# Patient Record
Sex: Female | Born: 1935 | Race: White | Hispanic: No | Smoking: Former smoker
Health system: Southern US, Community
[De-identification: ages and names within clinical notes are randomized; demographics above are authoritative.]

## PROBLEM LIST (undated history)

## (undated) DIAGNOSIS — I2510 Atherosclerotic heart disease of native coronary artery without angina pectoris: Secondary | ICD-10-CM

## (undated) DIAGNOSIS — M1990 Unspecified osteoarthritis, unspecified site: Secondary | ICD-10-CM

## (undated) DIAGNOSIS — R296 Repeated falls: Secondary | ICD-10-CM

## (undated) DIAGNOSIS — N183 Chronic kidney disease, stage 3 unspecified: Secondary | ICD-10-CM

## (undated) DIAGNOSIS — F03A0 Unspecified dementia, mild, without behavioral disturbance, psychotic disturbance, mood disturbance, and anxiety: Secondary | ICD-10-CM

## (undated) DIAGNOSIS — F0390 Unspecified dementia without behavioral disturbance: Secondary | ICD-10-CM

## (undated) DIAGNOSIS — F329 Major depressive disorder, single episode, unspecified: Secondary | ICD-10-CM

## (undated) DIAGNOSIS — J302 Other seasonal allergic rhinitis: Secondary | ICD-10-CM

## (undated) DIAGNOSIS — R42 Dizziness and giddiness: Secondary | ICD-10-CM

## (undated) DIAGNOSIS — I10 Essential (primary) hypertension: Secondary | ICD-10-CM

## (undated) DIAGNOSIS — D649 Anemia, unspecified: Secondary | ICD-10-CM

## (undated) DIAGNOSIS — J45909 Unspecified asthma, uncomplicated: Secondary | ICD-10-CM

## (undated) DIAGNOSIS — E119 Type 2 diabetes mellitus without complications: Secondary | ICD-10-CM

## (undated) DIAGNOSIS — R570 Cardiogenic shock: Secondary | ICD-10-CM

## (undated) DIAGNOSIS — D696 Thrombocytopenia, unspecified: Secondary | ICD-10-CM

## (undated) DIAGNOSIS — E785 Hyperlipidemia, unspecified: Secondary | ICD-10-CM

## (undated) DIAGNOSIS — K469 Unspecified abdominal hernia without obstruction or gangrene: Secondary | ICD-10-CM

## (undated) DIAGNOSIS — H409 Unspecified glaucoma: Secondary | ICD-10-CM

## (undated) DIAGNOSIS — F32A Depression, unspecified: Secondary | ICD-10-CM

## (undated) DIAGNOSIS — I639 Cerebral infarction, unspecified: Secondary | ICD-10-CM

## (undated) DIAGNOSIS — C44301 Unspecified malignant neoplasm of skin of nose: Secondary | ICD-10-CM

## (undated) DIAGNOSIS — T8092XA Unspecified transfusion reaction, initial encounter: Secondary | ICD-10-CM

## (undated) DIAGNOSIS — K219 Gastro-esophageal reflux disease without esophagitis: Secondary | ICD-10-CM

## (undated) HISTORY — PX: UMBILICAL HERNIA REPAIR: SHX196

## (undated) HISTORY — PX: VITRECTOMY: SHX106

## (undated) HISTORY — PX: EYE SURGERY: SHX253

## (undated) HISTORY — PX: APPENDECTOMY: SHX54

## (undated) HISTORY — PX: NEPHRECTOMY: SHX65

## (undated) HISTORY — PX: CORONARY ANGIOPLASTY WITH STENT PLACEMENT: SHX49

## (undated) HISTORY — PX: TUBAL LIGATION: SHX77

---

## 1959-02-17 DIAGNOSIS — I639 Cerebral infarction, unspecified: Secondary | ICD-10-CM

## 1959-02-17 HISTORY — DX: Cerebral infarction, unspecified: I63.9

## 1997-04-18 ENCOUNTER — Encounter (INDEPENDENT_AMBULATORY_CARE_PROVIDER_SITE_OTHER): Payer: Self-pay | Admitting: *Deleted

## 1997-04-18 LAB — CONVERTED CEMR LAB

## 1997-09-24 ENCOUNTER — Encounter: Admission: RE | Admit: 1997-09-24 | Discharge: 1997-09-24 | Payer: Self-pay | Admitting: Family Medicine

## 1997-10-18 ENCOUNTER — Ambulatory Visit (HOSPITAL_COMMUNITY): Admission: RE | Admit: 1997-10-18 | Discharge: 1997-10-19 | Payer: Self-pay | Admitting: Ophthalmology

## 1997-10-27 ENCOUNTER — Encounter: Admission: RE | Admit: 1997-10-27 | Discharge: 1997-10-27 | Payer: Self-pay | Admitting: Family Medicine

## 1997-11-24 ENCOUNTER — Encounter: Admission: RE | Admit: 1997-11-24 | Discharge: 1997-11-24 | Payer: Self-pay | Admitting: Family Medicine

## 1998-01-05 ENCOUNTER — Encounter: Admission: RE | Admit: 1998-01-05 | Discharge: 1998-01-05 | Payer: Self-pay | Admitting: Family Medicine

## 1998-02-15 ENCOUNTER — Encounter: Admission: RE | Admit: 1998-02-15 | Discharge: 1998-02-15 | Payer: Self-pay | Admitting: Sports Medicine

## 1998-03-04 ENCOUNTER — Encounter: Admission: RE | Admit: 1998-03-04 | Discharge: 1998-03-04 | Payer: Self-pay | Admitting: Family Medicine

## 1998-04-18 ENCOUNTER — Encounter: Admission: RE | Admit: 1998-04-18 | Discharge: 1998-04-18 | Payer: Self-pay | Admitting: Family Medicine

## 1998-05-11 ENCOUNTER — Encounter: Admission: RE | Admit: 1998-05-11 | Discharge: 1998-05-11 | Payer: Self-pay | Admitting: Family Medicine

## 1998-05-17 ENCOUNTER — Encounter: Admission: RE | Admit: 1998-05-17 | Discharge: 1998-05-17 | Payer: Self-pay | Admitting: Family Medicine

## 1998-07-21 ENCOUNTER — Encounter: Admission: RE | Admit: 1998-07-21 | Discharge: 1998-07-21 | Payer: Self-pay | Admitting: Family Medicine

## 1998-08-08 ENCOUNTER — Encounter: Admission: RE | Admit: 1998-08-08 | Discharge: 1998-08-08 | Payer: Self-pay | Admitting: Family Medicine

## 1998-09-02 ENCOUNTER — Encounter: Admission: RE | Admit: 1998-09-02 | Discharge: 1998-09-02 | Payer: Self-pay | Admitting: Family Medicine

## 1998-09-19 ENCOUNTER — Encounter: Admission: RE | Admit: 1998-09-19 | Discharge: 1998-09-19 | Payer: Self-pay | Admitting: Family Medicine

## 1998-10-04 ENCOUNTER — Encounter: Admission: RE | Admit: 1998-10-04 | Discharge: 1998-10-04 | Payer: Self-pay | Admitting: Family Medicine

## 1999-01-02 ENCOUNTER — Encounter: Admission: RE | Admit: 1999-01-02 | Discharge: 1999-01-02 | Payer: Self-pay | Admitting: Family Medicine

## 1999-01-09 ENCOUNTER — Encounter: Admission: RE | Admit: 1999-01-09 | Discharge: 1999-01-09 | Payer: Self-pay | Admitting: Family Medicine

## 1999-01-19 ENCOUNTER — Encounter: Admission: RE | Admit: 1999-01-19 | Discharge: 1999-01-19 | Payer: Self-pay | Admitting: Family Medicine

## 1999-02-01 ENCOUNTER — Encounter: Admission: RE | Admit: 1999-02-01 | Discharge: 1999-02-01 | Payer: Self-pay | Admitting: Family Medicine

## 1999-04-24 ENCOUNTER — Encounter: Admission: RE | Admit: 1999-04-24 | Discharge: 1999-04-24 | Payer: Self-pay | Admitting: Family Medicine

## 1999-07-12 ENCOUNTER — Encounter: Admission: RE | Admit: 1999-07-12 | Discharge: 1999-07-12 | Payer: Self-pay | Admitting: Family Medicine

## 1999-07-13 ENCOUNTER — Encounter: Admission: RE | Admit: 1999-07-13 | Discharge: 1999-10-11 | Payer: Self-pay | Admitting: Sports Medicine

## 1999-07-27 ENCOUNTER — Encounter: Admission: RE | Admit: 1999-07-27 | Discharge: 1999-07-27 | Payer: Self-pay | Admitting: Family Medicine

## 1999-09-15 ENCOUNTER — Encounter: Admission: RE | Admit: 1999-09-15 | Discharge: 1999-09-15 | Payer: Self-pay | Admitting: Family Medicine

## 1999-09-15 ENCOUNTER — Encounter: Payer: Self-pay | Admitting: Family Medicine

## 1999-10-12 ENCOUNTER — Encounter: Admission: RE | Admit: 1999-10-12 | Discharge: 1999-10-12 | Payer: Self-pay | Admitting: Family Medicine

## 1999-10-20 ENCOUNTER — Encounter: Payer: Self-pay | Admitting: Sports Medicine

## 1999-10-20 ENCOUNTER — Encounter: Admission: RE | Admit: 1999-10-20 | Discharge: 1999-10-20 | Payer: Self-pay | Admitting: Sports Medicine

## 1999-11-03 ENCOUNTER — Encounter: Admission: RE | Admit: 1999-11-03 | Discharge: 1999-11-03 | Payer: Self-pay | Admitting: Family Medicine

## 2000-01-03 ENCOUNTER — Encounter: Admission: RE | Admit: 2000-01-03 | Discharge: 2000-01-03 | Payer: Self-pay | Admitting: Family Medicine

## 2000-01-17 ENCOUNTER — Encounter: Admission: RE | Admit: 2000-01-17 | Discharge: 2000-01-17 | Payer: Self-pay | Admitting: Family Medicine

## 2000-01-20 ENCOUNTER — Encounter: Payer: Self-pay | Admitting: Emergency Medicine

## 2000-01-20 ENCOUNTER — Emergency Department (HOSPITAL_COMMUNITY): Admission: EM | Admit: 2000-01-20 | Discharge: 2000-01-20 | Payer: Self-pay | Admitting: Emergency Medicine

## 2000-03-29 ENCOUNTER — Encounter: Admission: RE | Admit: 2000-03-29 | Discharge: 2000-03-29 | Payer: Self-pay | Admitting: Family Medicine

## 2000-09-03 ENCOUNTER — Encounter: Admission: RE | Admit: 2000-09-03 | Discharge: 2000-09-03 | Payer: Self-pay | Admitting: Family Medicine

## 2000-09-03 ENCOUNTER — Ambulatory Visit (HOSPITAL_COMMUNITY): Admission: RE | Admit: 2000-09-03 | Discharge: 2000-09-03 | Payer: Self-pay | Admitting: Family Medicine

## 2000-09-12 ENCOUNTER — Encounter: Admission: RE | Admit: 2000-09-12 | Discharge: 2000-09-12 | Payer: Self-pay | Admitting: Family Medicine

## 2000-09-19 ENCOUNTER — Encounter: Admission: RE | Admit: 2000-09-19 | Discharge: 2000-09-19 | Payer: Self-pay | Admitting: *Deleted

## 2000-09-19 ENCOUNTER — Encounter: Payer: Self-pay | Admitting: Sports Medicine

## 2000-09-26 ENCOUNTER — Encounter: Payer: Self-pay | Admitting: Emergency Medicine

## 2000-09-26 ENCOUNTER — Emergency Department (HOSPITAL_COMMUNITY): Admission: EM | Admit: 2000-09-26 | Discharge: 2000-09-26 | Payer: Self-pay | Admitting: Emergency Medicine

## 2000-10-10 ENCOUNTER — Encounter: Admission: RE | Admit: 2000-10-10 | Discharge: 2000-10-10 | Payer: Self-pay | Admitting: Family Medicine

## 2000-12-03 ENCOUNTER — Encounter: Admission: RE | Admit: 2000-12-03 | Discharge: 2000-12-03 | Payer: Self-pay | Admitting: Family Medicine

## 2000-12-26 ENCOUNTER — Encounter: Admission: RE | Admit: 2000-12-26 | Discharge: 2000-12-26 | Payer: Self-pay | Admitting: Family Medicine

## 2001-01-30 ENCOUNTER — Encounter: Admission: RE | Admit: 2001-01-30 | Discharge: 2001-01-30 | Payer: Self-pay | Admitting: Family Medicine

## 2001-04-24 ENCOUNTER — Encounter: Admission: RE | Admit: 2001-04-24 | Discharge: 2001-04-24 | Payer: Self-pay | Admitting: Family Medicine

## 2001-05-05 ENCOUNTER — Encounter: Admission: RE | Admit: 2001-05-05 | Discharge: 2001-05-05 | Payer: Self-pay | Admitting: Sports Medicine

## 2001-05-14 ENCOUNTER — Encounter: Admission: RE | Admit: 2001-05-14 | Discharge: 2001-05-14 | Payer: Self-pay | Admitting: Family Medicine

## 2001-06-12 ENCOUNTER — Encounter: Admission: RE | Admit: 2001-06-12 | Discharge: 2001-06-12 | Payer: Self-pay | Admitting: Family Medicine

## 2001-06-20 ENCOUNTER — Encounter: Admission: RE | Admit: 2001-06-20 | Discharge: 2001-06-20 | Payer: Self-pay | Admitting: Family Medicine

## 2001-06-25 ENCOUNTER — Encounter: Admission: RE | Admit: 2001-06-25 | Discharge: 2001-06-25 | Payer: Self-pay | Admitting: Family Medicine

## 2001-08-11 ENCOUNTER — Encounter: Admission: RE | Admit: 2001-08-11 | Discharge: 2001-08-11 | Payer: Self-pay | Admitting: Family Medicine

## 2001-08-20 ENCOUNTER — Encounter: Admission: RE | Admit: 2001-08-20 | Discharge: 2001-08-20 | Payer: Self-pay | Admitting: Family Medicine

## 2001-09-22 ENCOUNTER — Encounter: Admission: RE | Admit: 2001-09-22 | Discharge: 2001-09-22 | Payer: Self-pay | Admitting: *Deleted

## 2001-09-22 ENCOUNTER — Encounter: Payer: Self-pay | Admitting: *Deleted

## 2001-09-22 ENCOUNTER — Encounter: Admission: RE | Admit: 2001-09-22 | Discharge: 2001-09-22 | Payer: Self-pay | Admitting: Family Medicine

## 2001-10-01 ENCOUNTER — Ambulatory Visit (HOSPITAL_COMMUNITY): Admission: RE | Admit: 2001-10-01 | Discharge: 2001-10-01 | Payer: Self-pay | Admitting: Gastroenterology

## 2001-10-30 ENCOUNTER — Encounter: Admission: RE | Admit: 2001-10-30 | Discharge: 2001-10-30 | Payer: Self-pay | Admitting: Family Medicine

## 2001-11-18 ENCOUNTER — Encounter: Admission: RE | Admit: 2001-11-18 | Discharge: 2001-11-18 | Payer: Self-pay | Admitting: Family Medicine

## 2001-11-21 ENCOUNTER — Encounter: Admission: RE | Admit: 2001-11-21 | Discharge: 2001-11-21 | Payer: Self-pay | Admitting: Family Medicine

## 2002-01-09 ENCOUNTER — Encounter: Admission: RE | Admit: 2002-01-09 | Discharge: 2002-01-09 | Payer: Self-pay | Admitting: Family Medicine

## 2002-01-19 ENCOUNTER — Ambulatory Visit (HOSPITAL_COMMUNITY): Admission: RE | Admit: 2002-01-19 | Discharge: 2002-01-20 | Payer: Self-pay | Admitting: Ophthalmology

## 2002-01-19 ENCOUNTER — Encounter: Payer: Self-pay | Admitting: Ophthalmology

## 2002-03-02 ENCOUNTER — Encounter: Admission: RE | Admit: 2002-03-02 | Discharge: 2002-03-02 | Payer: Self-pay | Admitting: Family Medicine

## 2002-03-13 ENCOUNTER — Encounter: Admission: RE | Admit: 2002-03-13 | Discharge: 2002-03-13 | Payer: Self-pay | Admitting: Sports Medicine

## 2002-06-01 ENCOUNTER — Encounter: Admission: RE | Admit: 2002-06-01 | Discharge: 2002-06-01 | Payer: Self-pay | Admitting: Family Medicine

## 2002-07-05 ENCOUNTER — Emergency Department (HOSPITAL_COMMUNITY): Admission: EM | Admit: 2002-07-05 | Discharge: 2002-07-05 | Payer: Self-pay | Admitting: Emergency Medicine

## 2002-07-05 ENCOUNTER — Encounter: Payer: Self-pay | Admitting: Emergency Medicine

## 2002-07-27 ENCOUNTER — Encounter: Admission: RE | Admit: 2002-07-27 | Discharge: 2002-07-27 | Payer: Self-pay | Admitting: Family Medicine

## 2002-10-15 ENCOUNTER — Encounter: Admission: RE | Admit: 2002-10-15 | Discharge: 2002-10-15 | Payer: Self-pay | Admitting: Family Medicine

## 2002-10-22 ENCOUNTER — Encounter: Admission: RE | Admit: 2002-10-22 | Discharge: 2002-10-22 | Payer: Self-pay | Admitting: Family Medicine

## 2003-02-01 ENCOUNTER — Encounter: Admission: RE | Admit: 2003-02-01 | Discharge: 2003-02-01 | Payer: Self-pay | Admitting: Sports Medicine

## 2003-02-17 ENCOUNTER — Encounter: Admission: RE | Admit: 2003-02-17 | Discharge: 2003-02-17 | Payer: Self-pay | Admitting: Family Medicine

## 2003-03-01 ENCOUNTER — Encounter: Payer: Self-pay | Admitting: Sports Medicine

## 2003-03-01 ENCOUNTER — Encounter: Admission: RE | Admit: 2003-03-01 | Discharge: 2003-03-01 | Payer: Self-pay | Admitting: Sports Medicine

## 2003-03-04 ENCOUNTER — Encounter: Admission: RE | Admit: 2003-03-04 | Discharge: 2003-03-04 | Payer: Self-pay | Admitting: Family Medicine

## 2003-03-25 ENCOUNTER — Encounter: Admission: RE | Admit: 2003-03-25 | Discharge: 2003-03-25 | Payer: Self-pay | Admitting: Family Medicine

## 2003-04-23 ENCOUNTER — Encounter: Admission: RE | Admit: 2003-04-23 | Discharge: 2003-04-23 | Payer: Self-pay | Admitting: Sports Medicine

## 2003-06-03 ENCOUNTER — Encounter: Admission: RE | Admit: 2003-06-03 | Discharge: 2003-06-03 | Payer: Self-pay | Admitting: Family Medicine

## 2003-06-19 HISTORY — PX: CARDIAC CATHETERIZATION: SHX172

## 2003-07-29 ENCOUNTER — Emergency Department (HOSPITAL_COMMUNITY): Admission: EM | Admit: 2003-07-29 | Discharge: 2003-07-29 | Payer: Self-pay | Admitting: Emergency Medicine

## 2003-08-17 ENCOUNTER — Ambulatory Visit (HOSPITAL_COMMUNITY): Admission: RE | Admit: 2003-08-17 | Discharge: 2003-08-17 | Payer: Self-pay | Admitting: Cardiology

## 2003-08-30 ENCOUNTER — Encounter: Admission: RE | Admit: 2003-08-30 | Discharge: 2003-08-30 | Payer: Self-pay | Admitting: Sports Medicine

## 2003-09-03 ENCOUNTER — Inpatient Hospital Stay (HOSPITAL_COMMUNITY): Admission: EM | Admit: 2003-09-03 | Discharge: 2003-09-05 | Payer: Self-pay | Admitting: Emergency Medicine

## 2003-10-04 ENCOUNTER — Encounter: Admission: RE | Admit: 2003-10-04 | Discharge: 2003-10-04 | Payer: Self-pay | Admitting: Family Medicine

## 2003-11-22 ENCOUNTER — Encounter: Admission: RE | Admit: 2003-11-22 | Discharge: 2003-11-22 | Payer: Self-pay | Admitting: Family Medicine

## 2004-01-21 ENCOUNTER — Encounter: Admission: RE | Admit: 2004-01-21 | Discharge: 2004-01-21 | Payer: Self-pay | Admitting: Family Medicine

## 2004-03-08 ENCOUNTER — Encounter: Admission: RE | Admit: 2004-03-08 | Discharge: 2004-03-08 | Payer: Self-pay | Admitting: Sports Medicine

## 2004-03-30 ENCOUNTER — Ambulatory Visit: Payer: Self-pay | Admitting: Family Medicine

## 2004-05-03 ENCOUNTER — Encounter: Admission: RE | Admit: 2004-05-03 | Discharge: 2004-05-03 | Payer: Self-pay | Admitting: Sports Medicine

## 2004-05-24 ENCOUNTER — Encounter: Admission: RE | Admit: 2004-05-24 | Discharge: 2004-05-24 | Payer: Self-pay | Admitting: Sports Medicine

## 2004-06-27 ENCOUNTER — Ambulatory Visit: Payer: Self-pay | Admitting: Family Medicine

## 2004-07-04 ENCOUNTER — Ambulatory Visit: Payer: Self-pay | Admitting: Cardiovascular Disease

## 2004-07-18 ENCOUNTER — Ambulatory Visit: Payer: Self-pay | Admitting: Family Medicine

## 2004-09-08 ENCOUNTER — Ambulatory Visit: Payer: Self-pay | Admitting: Sports Medicine

## 2004-10-26 ENCOUNTER — Emergency Department (HOSPITAL_COMMUNITY): Admission: EM | Admit: 2004-10-26 | Discharge: 2004-10-26 | Payer: Self-pay | Admitting: Emergency Medicine

## 2004-11-08 ENCOUNTER — Ambulatory Visit: Payer: Self-pay | Admitting: Cardiovascular Disease

## 2004-11-22 ENCOUNTER — Ambulatory Visit: Payer: Self-pay

## 2004-11-27 ENCOUNTER — Ambulatory Visit: Payer: Self-pay | Admitting: Sports Medicine

## 2004-12-11 ENCOUNTER — Ambulatory Visit: Payer: Self-pay | Admitting: Cardiovascular Disease

## 2005-01-25 ENCOUNTER — Ambulatory Visit: Payer: Self-pay | Admitting: Family Medicine

## 2005-02-08 ENCOUNTER — Ambulatory Visit: Payer: Self-pay | Admitting: Sports Medicine

## 2005-02-16 HISTORY — PX: CHOLECYSTECTOMY: SHX55

## 2005-02-26 ENCOUNTER — Encounter: Payer: Self-pay | Admitting: Emergency Medicine

## 2005-02-27 ENCOUNTER — Inpatient Hospital Stay (HOSPITAL_COMMUNITY): Admission: AD | Admit: 2005-02-27 | Discharge: 2005-03-05 | Payer: Self-pay | Admitting: General Surgery

## 2005-02-27 ENCOUNTER — Encounter: Payer: Self-pay | Admitting: Emergency Medicine

## 2005-02-27 ENCOUNTER — Ambulatory Visit: Payer: Self-pay | Admitting: Cardiology

## 2005-02-28 ENCOUNTER — Encounter (INDEPENDENT_AMBULATORY_CARE_PROVIDER_SITE_OTHER): Payer: Self-pay | Admitting: *Deleted

## 2005-04-06 ENCOUNTER — Ambulatory Visit: Payer: Self-pay | Admitting: Cardiovascular Disease

## 2005-04-06 ENCOUNTER — Ambulatory Visit: Payer: Self-pay | Admitting: Family Medicine

## 2005-07-04 ENCOUNTER — Ambulatory Visit: Payer: Self-pay | Admitting: Family Medicine

## 2005-09-05 ENCOUNTER — Emergency Department (HOSPITAL_COMMUNITY): Admission: EM | Admit: 2005-09-05 | Discharge: 2005-09-05 | Payer: Self-pay | Admitting: Emergency Medicine

## 2005-10-10 ENCOUNTER — Ambulatory Visit: Payer: Self-pay | Admitting: Family Medicine

## 2006-01-03 ENCOUNTER — Ambulatory Visit: Payer: Self-pay | Admitting: Cardiovascular Disease

## 2006-01-22 ENCOUNTER — Encounter: Payer: Self-pay | Admitting: Cardiology

## 2006-01-22 ENCOUNTER — Ambulatory Visit: Payer: Self-pay

## 2006-03-04 ENCOUNTER — Ambulatory Visit: Payer: Self-pay | Admitting: Family Medicine

## 2006-05-04 ENCOUNTER — Ambulatory Visit: Payer: Self-pay | Admitting: Family Medicine

## 2006-05-04 ENCOUNTER — Inpatient Hospital Stay (HOSPITAL_COMMUNITY): Admission: EM | Admit: 2006-05-04 | Discharge: 2006-05-07 | Payer: Self-pay | Admitting: Emergency Medicine

## 2006-05-22 ENCOUNTER — Ambulatory Visit: Payer: Self-pay

## 2006-08-15 DIAGNOSIS — F339 Major depressive disorder, recurrent, unspecified: Secondary | ICD-10-CM | POA: Insufficient documentation

## 2006-08-15 DIAGNOSIS — M479 Spondylosis, unspecified: Secondary | ICD-10-CM | POA: Insufficient documentation

## 2006-08-15 DIAGNOSIS — I739 Peripheral vascular disease, unspecified: Secondary | ICD-10-CM

## 2006-08-15 DIAGNOSIS — E669 Obesity, unspecified: Secondary | ICD-10-CM | POA: Insufficient documentation

## 2006-08-15 DIAGNOSIS — E118 Type 2 diabetes mellitus with unspecified complications: Secondary | ICD-10-CM

## 2006-08-15 DIAGNOSIS — E7800 Pure hypercholesterolemia, unspecified: Secondary | ICD-10-CM

## 2006-08-15 DIAGNOSIS — K219 Gastro-esophageal reflux disease without esophagitis: Secondary | ICD-10-CM | POA: Insufficient documentation

## 2006-08-15 DIAGNOSIS — I252 Old myocardial infarction: Secondary | ICD-10-CM

## 2006-08-15 DIAGNOSIS — H3530 Unspecified macular degeneration: Secondary | ICD-10-CM | POA: Insufficient documentation

## 2006-08-15 DIAGNOSIS — J309 Allergic rhinitis, unspecified: Secondary | ICD-10-CM | POA: Insufficient documentation

## 2006-08-15 DIAGNOSIS — E1165 Type 2 diabetes mellitus with hyperglycemia: Secondary | ICD-10-CM

## 2006-08-16 ENCOUNTER — Encounter (INDEPENDENT_AMBULATORY_CARE_PROVIDER_SITE_OTHER): Payer: Self-pay | Admitting: *Deleted

## 2006-08-22 ENCOUNTER — Ambulatory Visit: Payer: Self-pay | Admitting: Sports Medicine

## 2006-09-03 ENCOUNTER — Ambulatory Visit: Payer: Self-pay | Admitting: Cardiovascular Disease

## 2006-10-25 ENCOUNTER — Telehealth (INDEPENDENT_AMBULATORY_CARE_PROVIDER_SITE_OTHER): Payer: Self-pay | Admitting: *Deleted

## 2006-11-04 ENCOUNTER — Encounter (INDEPENDENT_AMBULATORY_CARE_PROVIDER_SITE_OTHER): Payer: Self-pay | Admitting: Family Medicine

## 2006-11-04 ENCOUNTER — Ambulatory Visit: Payer: Self-pay | Admitting: Sports Medicine

## 2006-11-04 LAB — CONVERTED CEMR LAB
BUN: 21 mg/dL (ref 6–23)
Chloride: 105 meq/L (ref 96–112)
HCT: 38.2 %
Hemoglobin: 13.1 g/dL
Potassium: 4.2 meq/L (ref 3.5–5.3)

## 2006-12-09 ENCOUNTER — Encounter (INDEPENDENT_AMBULATORY_CARE_PROVIDER_SITE_OTHER): Payer: Self-pay | Admitting: Family Medicine

## 2007-03-11 ENCOUNTER — Telehealth: Payer: Self-pay | Admitting: *Deleted

## 2007-03-13 ENCOUNTER — Encounter (INDEPENDENT_AMBULATORY_CARE_PROVIDER_SITE_OTHER): Payer: Self-pay | Admitting: Family Medicine

## 2007-03-13 ENCOUNTER — Ambulatory Visit: Payer: Self-pay | Admitting: Family Medicine

## 2007-03-13 LAB — CONVERTED CEMR LAB
Basophils Absolute: 0 10*3/uL (ref 0.0–0.1)
Basophils Relative: 0 % (ref 0–1)
Bilirubin Urine: NEGATIVE
Blood in Urine, dipstick: NEGATIVE
Chloride: 99 meq/L (ref 96–112)
Creatinine, Ser: 1.04 mg/dL (ref 0.40–1.20)
Glucose, Urine, Semiquant: 100
MCHC: 33.4 g/dL (ref 30.0–36.0)
Monocytes Absolute: 1 10*3/uL — ABNORMAL HIGH (ref 0.2–0.7)
Neutro Abs: 9.4 10*3/uL — ABNORMAL HIGH (ref 1.7–7.7)
Neutrophils Relative %: 81 % — ABNORMAL HIGH (ref 43–77)
Potassium: 4.5 meq/L (ref 3.5–5.3)
Protein, U semiquant: 30
RDW: 16 % — ABNORMAL HIGH (ref 11.5–14.0)
pH: 5.5

## 2007-03-17 ENCOUNTER — Telehealth: Payer: Self-pay | Admitting: *Deleted

## 2007-03-26 ENCOUNTER — Ambulatory Visit: Payer: Self-pay | Admitting: Cardiovascular Disease

## 2007-03-26 ENCOUNTER — Encounter: Admission: RE | Admit: 2007-03-26 | Discharge: 2007-03-26 | Payer: Self-pay | Admitting: Sports Medicine

## 2007-03-31 ENCOUNTER — Ambulatory Visit: Payer: Self-pay | Admitting: Family Medicine

## 2007-03-31 DIAGNOSIS — K5900 Constipation, unspecified: Secondary | ICD-10-CM | POA: Insufficient documentation

## 2007-03-31 LAB — CONVERTED CEMR LAB: Hgb A1c MFr Bld: 8.2 %

## 2007-05-06 ENCOUNTER — Ambulatory Visit: Payer: Self-pay | Admitting: Family Medicine

## 2007-05-08 ENCOUNTER — Telehealth: Payer: Self-pay | Admitting: *Deleted

## 2007-05-12 ENCOUNTER — Telehealth: Payer: Self-pay | Admitting: *Deleted

## 2007-05-19 ENCOUNTER — Encounter: Payer: Self-pay | Admitting: *Deleted

## 2007-06-13 ENCOUNTER — Encounter (INDEPENDENT_AMBULATORY_CARE_PROVIDER_SITE_OTHER): Payer: Self-pay | Admitting: Family Medicine

## 2007-08-06 ENCOUNTER — Encounter (INDEPENDENT_AMBULATORY_CARE_PROVIDER_SITE_OTHER): Payer: Self-pay | Admitting: Family Medicine

## 2007-08-13 ENCOUNTER — Ambulatory Visit: Payer: Self-pay

## 2007-08-13 ENCOUNTER — Encounter (INDEPENDENT_AMBULATORY_CARE_PROVIDER_SITE_OTHER): Payer: Self-pay | Admitting: Family Medicine

## 2007-09-03 ENCOUNTER — Ambulatory Visit: Payer: Self-pay | Admitting: Family Medicine

## 2007-09-03 LAB — CONVERTED CEMR LAB: Hgb A1c MFr Bld: 7.7 %

## 2007-09-12 ENCOUNTER — Telehealth: Payer: Self-pay | Admitting: *Deleted

## 2007-11-17 ENCOUNTER — Encounter (INDEPENDENT_AMBULATORY_CARE_PROVIDER_SITE_OTHER): Payer: Self-pay | Admitting: Family Medicine

## 2007-11-17 ENCOUNTER — Ambulatory Visit: Payer: Self-pay | Admitting: Family Medicine

## 2007-11-17 LAB — CONVERTED CEMR LAB: Hgb A1c MFr Bld: 7.3 %

## 2007-12-01 ENCOUNTER — Encounter (INDEPENDENT_AMBULATORY_CARE_PROVIDER_SITE_OTHER): Payer: Self-pay | Admitting: Family Medicine

## 2007-12-04 LAB — CONVERTED CEMR LAB
ALT: 12 units/L (ref 0–35)
AST: 15 units/L (ref 0–37)
Alkaline Phosphatase: 83 units/L (ref 39–117)
CO2: 23 meq/L (ref 19–32)
Creatinine, Ser: 1.14 mg/dL (ref 0.40–1.20)
Total Bilirubin: 0.4 mg/dL (ref 0.3–1.2)
Total CK: 37 units/L (ref 7–177)

## 2008-02-11 ENCOUNTER — Ambulatory Visit: Payer: Self-pay | Admitting: Cardiovascular Disease

## 2008-02-19 ENCOUNTER — Ambulatory Visit: Payer: Self-pay | Admitting: Family Medicine

## 2008-03-26 ENCOUNTER — Ambulatory Visit: Payer: Self-pay | Admitting: Family Medicine

## 2008-06-22 ENCOUNTER — Encounter (INDEPENDENT_AMBULATORY_CARE_PROVIDER_SITE_OTHER): Payer: Self-pay | Admitting: Family Medicine

## 2008-06-22 ENCOUNTER — Ambulatory Visit: Payer: Self-pay | Admitting: Family Medicine

## 2008-06-22 LAB — CONVERTED CEMR LAB
Chloride: 105 meq/L (ref 96–112)
Glucose, Bld: 174 mg/dL — ABNORMAL HIGH (ref 70–99)
Potassium: 4.6 meq/L (ref 3.5–5.3)
Sodium: 142 meq/L (ref 135–145)

## 2008-06-23 ENCOUNTER — Telehealth: Payer: Self-pay | Admitting: *Deleted

## 2008-06-28 ENCOUNTER — Telehealth: Payer: Self-pay | Admitting: *Deleted

## 2008-07-08 ENCOUNTER — Encounter: Payer: Self-pay | Admitting: Family Medicine

## 2008-07-08 ENCOUNTER — Telehealth: Payer: Self-pay | Admitting: Family Medicine

## 2008-08-06 ENCOUNTER — Encounter: Payer: Self-pay | Admitting: Cardiovascular Disease

## 2008-08-06 ENCOUNTER — Ambulatory Visit: Payer: Self-pay | Admitting: Cardiovascular Disease

## 2008-08-09 ENCOUNTER — Encounter: Payer: Self-pay | Admitting: Family Medicine

## 2008-10-03 ENCOUNTER — Emergency Department (HOSPITAL_COMMUNITY): Admission: EM | Admit: 2008-10-03 | Discharge: 2008-10-03 | Payer: Self-pay | Admitting: Emergency Medicine

## 2008-11-02 ENCOUNTER — Emergency Department (HOSPITAL_COMMUNITY): Admission: EM | Admit: 2008-11-02 | Discharge: 2008-11-02 | Payer: Self-pay | Admitting: Emergency Medicine

## 2008-11-07 ENCOUNTER — Emergency Department (HOSPITAL_COMMUNITY): Admission: EM | Admit: 2008-11-07 | Discharge: 2008-11-07 | Payer: Self-pay | Admitting: Emergency Medicine

## 2008-11-07 ENCOUNTER — Telehealth: Payer: Self-pay | Admitting: Family Medicine

## 2008-11-21 ENCOUNTER — Emergency Department (HOSPITAL_COMMUNITY): Admission: EM | Admit: 2008-11-21 | Discharge: 2008-11-21 | Payer: Self-pay | Admitting: Emergency Medicine

## 2008-11-22 ENCOUNTER — Ambulatory Visit: Payer: Self-pay | Admitting: Family Medicine

## 2008-11-22 ENCOUNTER — Encounter: Payer: Self-pay | Admitting: *Deleted

## 2008-11-22 DIAGNOSIS — R42 Dizziness and giddiness: Secondary | ICD-10-CM

## 2008-11-22 LAB — CONVERTED CEMR LAB: Hgb A1c MFr Bld: 6.8 %

## 2008-12-23 ENCOUNTER — Emergency Department (HOSPITAL_COMMUNITY): Admission: EM | Admit: 2008-12-23 | Discharge: 2008-12-23 | Payer: Self-pay | Admitting: Emergency Medicine

## 2008-12-23 ENCOUNTER — Telehealth: Payer: Self-pay | Admitting: Family Medicine

## 2008-12-24 ENCOUNTER — Telehealth: Payer: Self-pay | Admitting: *Deleted

## 2009-01-04 ENCOUNTER — Telehealth: Payer: Self-pay | Admitting: *Deleted

## 2009-02-02 ENCOUNTER — Encounter (INDEPENDENT_AMBULATORY_CARE_PROVIDER_SITE_OTHER): Payer: Self-pay | Admitting: *Deleted

## 2009-02-24 ENCOUNTER — Ambulatory Visit: Payer: Self-pay | Admitting: Family Medicine

## 2009-03-01 ENCOUNTER — Encounter: Payer: Self-pay | Admitting: Family Medicine

## 2009-03-14 ENCOUNTER — Encounter: Payer: Self-pay | Admitting: Family Medicine

## 2009-03-14 ENCOUNTER — Telehealth: Payer: Self-pay | Admitting: Family Medicine

## 2009-03-18 ENCOUNTER — Telehealth (INDEPENDENT_AMBULATORY_CARE_PROVIDER_SITE_OTHER): Payer: Self-pay | Admitting: *Deleted

## 2009-03-29 ENCOUNTER — Telehealth: Payer: Self-pay | Admitting: Family Medicine

## 2009-03-30 ENCOUNTER — Encounter: Payer: Self-pay | Admitting: Family Medicine

## 2009-04-12 ENCOUNTER — Ambulatory Visit: Payer: Self-pay | Admitting: Cardiovascular Disease

## 2009-04-12 ENCOUNTER — Ambulatory Visit: Payer: Self-pay | Admitting: Family Medicine

## 2009-04-12 DIAGNOSIS — I10 Essential (primary) hypertension: Secondary | ICD-10-CM | POA: Insufficient documentation

## 2009-06-07 ENCOUNTER — Telehealth: Payer: Self-pay | Admitting: *Deleted

## 2009-06-16 ENCOUNTER — Ambulatory Visit: Payer: Self-pay

## 2009-06-16 ENCOUNTER — Encounter: Payer: Self-pay | Admitting: Family Medicine

## 2009-06-16 LAB — CONVERTED CEMR LAB
AST: 11 units/L (ref 0–37)
Albumin: 3.9 g/dL (ref 3.5–5.2)
Alkaline Phosphatase: 73 units/L (ref 39–117)
Chloride: 105 meq/L (ref 96–112)
Glucose, Bld: 105 mg/dL — ABNORMAL HIGH (ref 70–99)
Potassium: 4.9 meq/L (ref 3.5–5.3)
Sodium: 142 meq/L (ref 135–145)
Total Protein: 6.5 g/dL (ref 6.0–8.3)

## 2009-06-19 ENCOUNTER — Encounter: Payer: Self-pay | Admitting: Family Medicine

## 2009-07-07 ENCOUNTER — Ambulatory Visit: Payer: Self-pay | Admitting: Family Medicine

## 2009-07-07 DIAGNOSIS — R32 Unspecified urinary incontinence: Secondary | ICD-10-CM

## 2009-07-07 DIAGNOSIS — R0609 Other forms of dyspnea: Secondary | ICD-10-CM

## 2009-07-07 DIAGNOSIS — R0989 Other specified symptoms and signs involving the circulatory and respiratory systems: Secondary | ICD-10-CM

## 2009-08-18 ENCOUNTER — Encounter (INDEPENDENT_AMBULATORY_CARE_PROVIDER_SITE_OTHER): Payer: Self-pay | Admitting: *Deleted

## 2009-10-17 ENCOUNTER — Telehealth: Payer: Self-pay | Admitting: Family Medicine

## 2009-10-26 ENCOUNTER — Encounter: Payer: Self-pay | Admitting: Family Medicine

## 2009-10-27 ENCOUNTER — Ambulatory Visit: Payer: Self-pay | Admitting: Family Medicine

## 2009-11-09 ENCOUNTER — Telehealth: Payer: Self-pay | Admitting: Family Medicine

## 2009-11-15 ENCOUNTER — Telehealth: Payer: Self-pay | Admitting: *Deleted

## 2009-11-17 ENCOUNTER — Encounter: Payer: Self-pay | Admitting: Family Medicine

## 2009-11-17 ENCOUNTER — Ambulatory Visit: Payer: Self-pay | Admitting: Cardiovascular Disease

## 2009-12-04 ENCOUNTER — Encounter: Payer: Self-pay | Admitting: Family Medicine

## 2009-12-13 ENCOUNTER — Encounter: Payer: Self-pay | Admitting: Family Medicine

## 2010-04-11 ENCOUNTER — Encounter: Admission: RE | Admit: 2010-04-11 | Discharge: 2010-04-11 | Payer: Self-pay | Admitting: Family Medicine

## 2010-04-14 ENCOUNTER — Encounter (INDEPENDENT_AMBULATORY_CARE_PROVIDER_SITE_OTHER): Payer: Self-pay | Admitting: Pharmacist

## 2010-07-18 NOTE — Miscellaneous (Signed)
Summary: med changes  Medications Added TOPROL XL 25 MG XR24H-TAB (METOPROLOL SUCCINATE) one tab by mouth qhs ISOSORBIDE MONONITRATE CR 60 MG  TB24 (ISOSORBIDE MONONITRATE) one tab by mouth daily       Clinical Lists Changes  Medications: Removed medication of COZAAR 25 MG TABS (LOSARTAN POTASSIUM) Take 1 tablet by mouth once a day Removed medication of FUROSEMIDE 20 MG TABS (FUROSEMIDE) take one tablet daily if needed for swelling Removed medication of GLUCOPHAGE 1000 MG TABS (METFORMIN HCL) Take 1 tablet by mouth twice a day Changed medication from METOPROLOL TARTRATE 25 MG  TABS (METOPROLOL TARTRATE) 1/2 tablet by mouth bid to TOPROL XL 25 MG XR24H-TAB (METOPROLOL SUCCINATE) one tab by mouth qhs - Signed Removed medication of ZYRTEC ALLERGY 10 MG TABS (CETIRIZINE HCL) Take 1 tablet by mouth once a day Changed medication from ISOSORBIDE MONONITRATE CR 60 MG  TB24 (ISOSORBIDE MONONITRATE) one tab by mouth bid to ISOSORBIDE MONONITRATE CR 60 MG  TB24 (ISOSORBIDE MONONITRATE) one tab by mouth daily - Signed Removed medication of MIRALAX  PACK (POLYETHYLENE GLYCOL 3350) take one packet twice daily this week, then take one packet once daily Removed medication of MECLIZINE HCL 25 MG TABS (MECLIZINE HCL) 1 by mouth as needed vertigo Rx of TOPROL XL 25 MG XR24H-TAB (METOPROLOL SUCCINATE) one tab by mouth qhs;  #30 x 3;  Signed;  Entered by: Lequita Asal  MD;  Authorized by: Lequita Asal  MD;  Method used: Electronically to [facility] [address] # [id number]*, [address] [place], [place], [place]  [address], Ph: [phone] or [phone], Fax: [phone] Rx of ISOSORBIDE MONONITRATE CR 60 MG  TB24 (ISOSORBIDE MONONITRATE) one tab by mouth daily;  #30 x 3;  Signed;  Entered by: Lequita Asal  MD;  Authorized by: Lequita Asal  MD;  Method used: Electronically to [facility] [address] # [id number]*, [address] [place], [place], [place]  [address], Ph: [phone] or [phone],  Fax: [phone]    Prescriptions: ISOSORBIDE MONONITRATE CR 60 MG  TB24 (ISOSORBIDE MONONITRATE) one tab by mouth daily  #30 x 3   Entered and Authorized by:   Lequita Asal  MD   Signed by:   Lequita Asal  MD on 10/26/2009   Method used:   Electronically to        [facility] [address] [id number]* (retail)       [address]       [place], [place]  [address]       Ph: [phone] or [phone]       Fax: [phone]   RxID:   [id number] TOPROL XL 25 MG XR24H-TAB (METOPROLOL SUCCINATE) one tab by mouth qhs  #30 x 3   Entered and Authorized by:   Lequita Asal  MD   Signed by:   Lequita Asal  MD on 10/26/2009   Method used:   Electronically to        [facility] [address] [id number]* (retail)       [address]       [place], [place]  [address]       Ph: [phone] or [phone]       Fax: [phone]   RxID:   [id number]

## 2010-07-18 NOTE — Progress Notes (Signed)
Summary: triage   Phone Note Call from Patient Call back at [phone]   Caller: Daughter-in-law Lourdes Sledge Summary of Call: Says mother-in-law is confused about her medications.   Initial call taken by: Clydell Hakim,  Nov 09, 2009 3:30 PM  Follow-up for Phone Call        states [facility] staff saw her mother in law saturday for services.  not sure what they are going to do. they did discuss snf with her but pt is "not ready" she is blind. lives alone. dtr lives in [place]. dtr in law lives in [place]. dtr in law states she does not know if she has the new blister packs of meds to take once a day.  in past she took them by feel. she is worried about the safety issues & stressors in her life. states she cannot go there now as her husband has stage 4 cancer & she is busy with him. dtr in [place] unable to come over frequently told her to call [facility] to see if med reminders were part of their services. call mom in law to see if new med packs have been recieved.  and old ones thrown out or returned. to pcp for further input Follow-up by: Golden Circle RN,  Nov 09, 2009 3:48 PM  Additional Follow-up for Phone Call Additional follow up Details #1::        need to figure out what pharmacy patient desires. susan did home visit last month to try and help. ultimately needs assisted living or snf Additional Follow-up by: Lequita Asal  MD,  Nov 09, 2009 4:23 PM

## 2010-07-18 NOTE — Letter (Signed)
Summary: [facility]/Episode Report  [facility]/Episode Report   Imported By: Knox Royalty 01/07/2010 12:39:07  _____________________________________________________________________  External Attachment:    Type:   Image     Comment:   External Document

## 2010-07-18 NOTE — Miscellaneous (Signed)
Summary: needs strips  Medications Added PRODIGY NO CODING BLOOD GLUC  STRP (GLUCOSE BLOOD) use once daily to check blood sugars. disp 100       Clinical Lists Changes her pharmacy in [place] called [phone], fax [phone]) asking for an order of testing strips. needs to know how often she checks cbgs per day. pt is very vision impaired from her macular degeneration. p[er notes she had not been checking them as she had no supplies. last provider of diabetic supplies was [place]. to pcp.Golden Circle RN  November 17, 2009 11:11 AM  I thought she had stopped because she couldnt see. once a day. she is only on oral meds.  Lequita Asal  MD  November 17, 2009 11:20 AM  Medications: Changed medication from PRODIGY VOICE BLOOD GLUCOSE  STRP (GLUCOSE BLOOD) use as directed. disp 100 to PRODIGY NO CODING BLOOD GLUC  STRP (GLUCOSE BLOOD) use once daily to check blood sugars. disp 100 - Signed Rx of PRODIGY VOICE BLOOD GLUCOSE  STRP (GLUCOSE BLOOD) use as directed. disp 100;  #1 x 3;  Signed;  Entered by: Lequita Asal  MD;  Authorized by: Lequita Asal  MD;  Method used: Electronically to [facility] [address], [place], [place], [place]  [address], Ph: [phone], Fax: [phone] Rx of PRODIGY NO CODING BLOOD GLUC  STRP (GLUCOSE BLOOD) use once daily to check blood sugars. disp 100;  #1 x 1;  Signed;  Entered by: Lequita Asal  MD;  Authorized by: Lequita Asal  MD;  Method used: Electronically to [facility] [address], [place], [place], [place]  [address], Ph: [phone], Fax: [phone]    Prescriptions: PRODIGY NO CODING BLOOD GLUC  STRP (GLUCOSE BLOOD) use once daily to check blood sugars. disp 100  #1 x 1   Entered and Authorized by:   Lequita Asal  MD   Signed by:   Lequita Asal  MD on 11/17/2009   Method used:   Electronically to        [facility] (retail)       [address]       [place], [place]  [address]       Ph: [phone]       Fax: [phone]   RxID:   [id number] PRODIGY VOICE BLOOD GLUCOSE  STRP (GLUCOSE BLOOD) use as directed. disp 100  #1 x 3   Entered and Authorized by:   Lequita Asal  MD   Signed by:   Lequita Asal  MD on 11/17/2009   Method used:   Electronically to        [facility] (retail)       [address]       [place], [place]  [address]       Ph: [phone]       Fax: [phone]   RxID:   [id number]  they specifically wanted to know how many times a day she is to test..[facility] RN  November 17, 2009 11:46 AM

## 2010-07-18 NOTE — Assessment & Plan Note (Signed)
Summary: home visit per Dr. Marijo Sanes   Primary Care Provider:  Lequita Asal  MD  CC:  Home Visit.  History of Present Illness: 77 y/o female with multiple medical issues seen for geriatric home visit  Personal care services- she currently receives 20 hours monthly. she states they mostly help her with cooking, making bed, and occasional help bathing. she has home health nursing which comes to help fill med box. previously received more time. patient elderly, blind, with claudication and other medical issues.   DM- on metformin and januvia. has speaking meter. has CBGs on wipe off board on kitchen table. patient denies perceived episodes of hypoglycemia.   urinary incontinence- reports urge-like symptoms. has ruined mattress due to nocturia. feels like she has a "hole in her pocketbook" in reference to her vagina.   SOB- significant DOE. extensive prior tobacco use (3 ppd x40 yrs).   Habits & Providers  Alcohol-Tobacco-Diet     Tobacco Status: quit > 6 months     Tobacco Counseling: to remain off tobacco products  Current Medications (verified): 1)  Aspirin Ec 81 Mg Tbec (Aspirin) .... Take 1 Tablet By Mouth Once A Day 2)  Cozaar 25 Mg Tabs (Losartan Potassium) .... Take 1 Tablet By Mouth Once A Day 3)  Famotidine 20 Mg Tabs (Famotidine) .... Take 1 Tablet By Mouth Twice A Day 4)  Furosemide 20 Mg Tabs (Furosemide) .... Take One Tablet Daily If Needed For Swelling 5)  Glucophage 1000 Mg Tabs (Metformin Hcl) .... Take 1 Tablet By Mouth Twice A Day 6)  Lotrisone 1-0.05 % Crea (Clotrimazole-Betamethasone) .... Apply A Small Amount To Skin Twice A Day. Disp 60 G 7)  Nitrostat 0.4 Mg Subl (Nitroglycerin) .... Place 1 Tablet Under Tongue As Directed 8)  Metoprolol Tartrate 25 Mg  Tabs (Metoprolol Tartrate) .... 1/2 Tablet By Mouth Bid 9)  Zoloft 100 Mg Tabs (Sertraline Hcl) .... Take 1 Tablet By Mouth Once A Day 10)  Zyrtec Allergy 10 Mg Tabs (Cetirizine Hcl) .... Take 1 Tablet By  Mouth Once A Day 11)  Simvastatin 40 Mg Tabs (Simvastatin) .... One Tab By Mouth Qhs 12)  Isosorbide Mononitrate Cr 60 Mg  Tb24 (Isosorbide Mononitrate) .... One Tab By Mouth Bid 13)  Januvia 50 Mg  Tabs (Sitagliptin Phosphate) .... One Tab By Mouth Qday 14)  Miralax  Pack (Polyethylene Glycol 3350) .... Take One Packet Twice Daily This Week, Then Take One Packet Once Daily 15)  Meclizine Hcl 25 Mg Tabs (Meclizine Hcl) .Marland Kitchen.. 1 By Mouth As Needed Vertigo 16)  Hydrocortisone 2.5 % Crea (Hydrocortisone) .... Apply To Affected Areas Twice A Day. Disp 60 G. 17)  Prodigy Voice Blood Glucose W/device Kit (Blood Glucose Monitoring Suppl) .... Use Ase Directed. 18)  Prodigy Voice Blood Glucose  Strp (Glucose Blood) .... Use As Directed. Disp 100  Allergies (verified): 1)  ! Advair Diskus (Fluticasone-Salmeterol)  Past History:  Past medical, surgical, family and social histories (including risk factors) reviewed, and no changes noted (except as noted below).  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 Father:cancer Mother:cancer  Social History: Reviewed history from 06/16/2009 and no changes required. Widowed. Lives alone in apartment with nurse aide 5 days per week.  [employer] 1hr daily. Has 7 children.  Her daughter, Theresia Majors, lives in [place]. Denies tobacco, alcohol, or drug use.Smoking Status:  quit > 6 months  Physical Exam  General:  elderly female, NAD. Lungs:  increased WOB with speaking. no accessory muscle use. bilateral occasional wheezes. prolonged expiratory phase.    Impression & Recommendations:  Problem # 1:  DYSPNEA ON EXERTION  (ICD-786.09) Assessment Deteriorated likely 2/2 COPD. would like patient to undergo PFTs to assess and determine best inhaler. patient reports intolerance to advair ("purple one")  Her updated medication list for this problem includes:    Furosemide 20 Mg Tabs (Furosemide) .Marland Kitchen... Take one tablet daily if needed for swelling    Metoprolol Tartrate 25 Mg Tabs (Metoprolol tartrate) .Marland Kitchen... 1/2 tablet by mouth bid  Orders: No Charge Patient Arrived (NCPA0) (NCPA0)  Problem # 2:  URINARY INCONTINENCE (ICD-788.30) Assessment: New patient to come for gyn eval in march to determine if anatomic issue causing symptoms (such as prolapse). no med changes for now  Problem # 3:  CONSTIPATION (ICD-564.00) continue miralax.   Her updated medication list for this problem includes:    Miralax Pack (Polyethylene glycol 3350) .Marland Kitchen... Take one packet twice daily this week, then take one packet once daily  Appended Document: home visit per Dr. Marijo Sanes spoke with Algernon Huxley at [facility] and faxed over updated list of medications

## 2010-07-18 NOTE — Letter (Signed)
Summary: Appointment - Reminder 2  [facility], Main Office  [address]   [place], [place] [address]   Phone: [phone]  Fax: [phone]     August 18, 2009 MRN: [id number]   DEAUNDRA DUPRIEST [address] [place], [place]  [address]   Dear [name],  Our records indicate that it is time to schedule a follow-up appointment with Dr. Eden Emms. It is very important that we reach you to schedule this appointment. We look forward to participating in your health care needs. Please contact us at the number listed above at your earliest convenience to schedule your appointment.  If you are unable to make an appointment at this time, give [place] a call so we can update our records.     Sincerely,  [facility] Scheduling Team

## 2010-07-18 NOTE — Miscellaneous (Signed)
Summary: Orders Update  Clinical Lists Changes  Problems: Added new problem of ENCOUNTER FOR LONG-TERM USE OF OTHER MEDICATIONS (ICD-V58.69) Orders: Added new Test order of B12-[facility] ([id number]) - Signed Added new Test order of CBC-[facility] (85027) - Signed   Ok per Dr. De La Cruz 

## 2010-07-18 NOTE — Miscellaneous (Signed)
Summary: Nurse, mental health Party Release Form   Imported By: Roderic Ovens 11/25/2009 16:17:31  _____________________________________________________________________  External Attachment:    Type:   Image     Comment:   External Document

## 2010-07-18 NOTE — Letter (Signed)
Summary: Generic Letter  [facility]  [address]   [place], [place] [address]   Phone: [phone]  Fax: [phone]    06/19/2009  [name] [address] [place], [place]  [address]  Dear [name],   Your cholesterol level was higher than I would recommend, given that you have diabetes and have had a heart attack before. I would like to switch you back to simvastatin for your cholesterol. You were previously on it, and there was concern it may have been making your muscles ache; however, your muscle labs were normal. I have sent a new prescription. If your legs start hurting, please let me know.         Sincerely,   Lequita Asal  MD  Appended Document: Generic Letter mailed.

## 2010-07-18 NOTE — Progress Notes (Signed)
Summary: meds prob   Phone Note Call from Patient Call back at Home Phone [phone]   Caller: Patient Summary of Call: is very confused about her meds and needs someone to come out and get them straight.  hasn't taken it in 2 days b/c she doesn't know which one to take.  has an aid, but she doesn't know either. Initial call taken by: De Nurse,  Oct 17, 2009 10:12 AM  Follow-up for Phone Call        states there is no one to come to her home & fill pill box. very hard of hearing. just lost a dtr recently, one has cancer 7 the other one cannot do it either. to pcp for suggestions as she did a home visit Follow-up by: Golden Circle RN,  Oct 17, 2009 11:11 AM  Additional Follow-up for Phone Call Additional follow up Details #1::        [facility] was coming to do this. not sure when it got discontinued.  Additional Follow-up by: Lequita Asal  MD,  Oct 17, 2009 11:23 AM    Additional Follow-up for Phone Call Additional follow up Details #2::    medicare will do some things for a brief time after discharge. since she had an aide, that must be from the [facility] program.  The aides do not fill pill boxes. she told me the sherrif brings the pill box. wants the doctor to come to her home & straighten things out as she is not taking any pills. states she has poor vision. suggested she come in so we can work with her on large print med bos. she said no, she wants the doctor to come. back to pcp Follow-up by: Golden Circle RN,  Oct 17, 2009 2:08 PM  Additional Follow-up for Phone Call Additional follow up Details #3:: Details for Additional Follow-up Action Taken: i am unable to do home visits. will send to [facility] Resident (Dr. Burnadette Pop) and Luretha Murphy to see if this is an option. Pt may need to consider Assisted Living or SNF if she is unable to manage at home.  Additional Follow-up by: Lequita Asal  MD,  Oct 17, 2009 2:15 PM

## 2010-07-18 NOTE — Assessment & Plan Note (Signed)
Summary: f39m/per daughter call/lg  Medications Added METOPROLOL TARTRATE 25 MG TABS (METOPROLOL TARTRATE) 1/2 tab by mouth two times a day ISOSORBIDE MONONITRATE CR 60 MG  TB24 (ISOSORBIDE MONONITRATE) 1 tab by mouth two times a day METFORMIN HCL 1000 MG TABS (METFORMIN HCL) 1 tab by mouth two times a day CETIRIZINE HCL 10 MG TABS (CETIRIZINE HCL) 1 tab by mouth once daily LOSARTAN POTASSIUM 25 MG TABS (LOSARTAN POTASSIUM) 1 tab by mouth once daily      Allergies Added:   Primary Provider:  Lequita Asal  MD   History of Present Illness: [name] is seen today for f/U of HTN, elevated lipids dyspnea and CAD.  She is a previous smoker with COPD.  Her last cath by Dr. Antoine Poche in 2005 showed a patent stent in the proximal RCA with a subtotally occluded OM branch and 50% PDA.  she has no active angina and a normal ECG on review today.  Given her age, dementia and lack of anginal she does not need a stress test.  Her last myovue in 07/2007 was low risk with a smalll inflat MI and mild periinfarct ischemia.  She gets around with a walker and has an aid at home in the morning.  Her children Harriett Sine) seem to keep an eye on her.  She is looking into the new [facility] in [place]  Current Problems (verified): 1)  Urinary Incontinence  (ICD-788.30) 2)  Dyspnea On Exertion  (ICD-786.09) 3)  Person Living Alone  (ICD-V60.3) 4)  Essential Hypertension, Benign  (ICD-401.1) 5)  Vertigo  (ICD-780.4) 6)  Constipation  (ICD-564.00) 7)  Rhinitis, Allergic  (ICD-477.9) 8)  Osteoarthritis of Spine, Nos  (ICD-721.90) 9)  Obesity, Nos  (ICD-278.00) 10)  Myocardial Infarction, Old  (ICD-412) 11)  Macular Degeneration  (ICD-362.50) 12)  Hypercholesterolemia  (ICD-272.0) 13)  Gastroesophageal Reflux, No Esophagitis  (ICD-530.81) 14)  Diabetes Mellitus, II, Complications  (ICD-250.92) 15)  Depression, Major, Recurrent  (ICD-296.30) 16)  Coronary, Arteriosclerosis  (ICD-414.00) 17)  Claudication,  Intermittent  (ICD-443.9)  Current Medications (verified): 1)  Aspirin Ec 81 Mg Tbec (Aspirin) .... Take 1 Tablet By Mouth Once A Day 2)  Famotidine 20 Mg Tabs (Famotidine) .... Take 1 Tablet By Mouth Twice A Day 3)  Metoprolol Tartrate 25 Mg Tabs (Metoprolol Tartrate) .... 1/2 Tab By Mouth Two Times A Day 4)  Zoloft 100 Mg Tabs (Sertraline Hcl) .... Take 1 Tablet By Mouth Once A Day 5)  Simvastatin 40 Mg Tabs (Simvastatin) .... One Tab By Mouth Qhs 6)  Isosorbide Mononitrate Cr 60 Mg  Tb24 (Isosorbide Mononitrate) .Marland Kitchen.. 1 Tab By Mouth Two Times A Day 7)  Januvia 50 Mg  Tabs (Sitagliptin Phosphate) .... One Tab By Mouth Qday 8)  Prodigy Voice Blood Glucose W/device Kit (Blood Glucose Monitoring Suppl) .... Use Ase Directed. 9)  Prodigy No Coding Blood Gluc  Strp (Glucose Blood) .... Use Once Daily To Check Blood Sugars. Disp 100 10)  Nitrostat 0.4 Mg Subl (Nitroglycerin) .... Place 1 Tablet Under Tongue As Directed 11)  Metformin Hcl 1000 Mg Tabs (Metformin Hcl) .Marland Kitchen.. 1 Tab By Mouth Two Times A Day 12)  Cetirizine Hcl 10 Mg Tabs (Cetirizine Hcl) .Marland Kitchen.. 1 Tab By Mouth Once Daily 13)  Losartan Potassium 25 Mg Tabs (Losartan Potassium) .Marland Kitchen.. 1 Tab By Mouth Once Daily  Allergies (verified): 1)  ! Advair Diskus (Fluticasone-Salmeterol)  Past History:  Past Medical History: Last updated: 07/07/2009 Diabetes Mellitus with retinopathy, nephropathy, neuropathy Gastroparesis Old  MI--lateral wall Umbilical Hernia Repair Anxiety/depression CAD Hyperlipidemia hx of reflux COPD  Past Surgical History: Last updated: 07/07/2009 Cardiac Cath/stent 2/96 Cholecystectomy - 02/16/2005 h/o L nephrectomy  Family History: Last updated: 08/05/2008 Father:cancer Mother:cancer  Social History: Last updated: 06/16/2009 Widowed. Lives alone in apartment with nurse aide 5 days per week.  [employer] 1hr daily. Has 7 children. Her daughter, Theresia Majors, lives in [place]. Denies tobacco, alcohol, or drug  use.  Review of Systems       Denies fever, malais, weight loss, blurry vision, decreased visual acuity, cough, sputum, SOB, hemoptysis, pleuritic pain, palpitaitons, heartburn, abdominal pain, melena, lower extremity edema, claudication, or rash.   Vital Signs:  Patient profile:   77 year old female Height:      61 inches Weight:      154 pounds BMI:     29.20 Pulse rate:   80 / minute Resp:     14 per minute BP sitting:   100 / 64  (left arm)  Vitals Entered By: Kem Parkinson (November 17, 2009 3:22 PM)  Physical Exam  General:  Affect appropriate Healthy:  appears stated age HEENT: normal Neck supple with no adenopathy JVP normal no bruits no thyromegaly Lungs clear with no wheezing and good diaphragmatic motion Heart:  S1/S2 no murmur,rub, gallop or click PMI normal Abdomen: benighn, BS positve, no tenderness, no AAA no bruit.  No HSM or HJR Distal pulses intact with no bruits No edema Neuro non-focal Skin warm and dry    Impression & Recommendations:  Problem # 1:  DYSPNEA ON EXERTION (ICD-786.09) Stable normal lung exam today;.  Her updated medication list for this problem includes:    Aspirin Ec 81 Mg Tbec (Aspirin) .Marland Kitchen... Take 1 tablet by mouth once a day    Metoprolol Tartrate 25 Mg Tabs (Metoprolol tartrate) .Marland Kitchen... 1/2 tab by mouth two times a day    Losartan Potassium 25 Mg Tabs (Losartan potassium) .Marland Kitchen... 1 tab by mouth once daily  Problem # 2:  ESSENTIAL HYPERTENSION, BENIGN (ICD-401.1) Well controlled Her updated medication list for this problem includes:    Aspirin Ec 81 Mg Tbec (Aspirin) .Marland Kitchen... Take 1 tablet by mouth once a day    Metoprolol Tartrate 25 Mg Tabs (Metoprolol tartrate) .Marland Kitchen... 1/2 tab by mouth two times a day    Losartan Potassium 25 Mg Tabs (Losartan potassium) .Marland Kitchen... 1 tab by mouth once daily  Problem # 3:  MYOCARDIAL INFARCTION, OLD (ICD-412) CAD stable with no angina.  Low risk myovue in 2009 Her updated medication list for this  problem includes:    Aspirin Ec 81 Mg Tbec (Aspirin) .Marland Kitchen... Take 1 tablet by mouth once a day    Metoprolol Tartrate 25 Mg Tabs (Metoprolol tartrate) .Marland Kitchen... 1/2 tab by mouth two times a day    Isosorbide Mononitrate Cr 60 Mg Tb24 (Isosorbide mononitrate) .Marland Kitchen... 1 tab by mouth two times a day    Nitrostat 0.4 Mg Subl (Nitroglycerin) .Marland Kitchen... Place 1 tablet under tongue as directed  Problem # 4:  HYPERCHOLESTEROLEMIA (ICD-272.0) Continue statin labs per primary Her updated medication list for this problem includes:    Simvastatin 40 Mg Tabs (Simvastatin) ..... One tab by mouth qhs  Patient Instructions: 1)  Your physician recommends that you schedule a follow-up appointment in: 6 MONTHS

## 2010-07-18 NOTE — Miscellaneous (Signed)
Summary: SUMMARY  patient is (hopefully) being transitioned to [facility], and will stop coming to [facility] if she is accepted. She is blind and very hard of hearing, so medications have been adjusted to be as simple as possible. patient has difficulty coming to appts, so is behind on labs. as part of [facility] this will be addressed.

## 2010-07-18 NOTE — Assessment & Plan Note (Signed)
Summary: home visit,df   Primary Care Provider:  Lequita Asal  MD   History of Present Illness: Home visit today per request of primary care physician.  The patient has called and was confused about her medicaions.  She has been receiving help through home health nursing to organize her medications as she has low vision and lives alone.  Home health discharged her this month and set up a mail delivery system that is very confusing, this system is through [facility] [phone], they combine pills in blister packs that are color coded for morning, afternoon, and evening.   She had multiple blister packs, it appeared that on she was not taking the medicaions several times per week.  She feels confused about the process.  She wants to stay in her home, does not want to live in a facility.  She complains of nocturia and accidents at night.    Current Medications (verified): 1)  Aspirin Ec 81 Mg Tbec (Aspirin) .... Take 1 Tablet By Mouth Once A Day 2)  Famotidine 20 Mg Tabs (Famotidine) .... Take 1 Tablet By Mouth Twice A Day 3)  Toprol Xl 25 Mg Xr24h-Tab (Metoprolol Succinate) .... One Tab By Mouth Qhs 4)  Zoloft 100 Mg Tabs (Sertraline Hcl) .... Take 1 Tablet By Mouth Once A Day 5)  Simvastatin 40 Mg Tabs (Simvastatin) .... One Tab By Mouth Qhs 6)  Isosorbide Mononitrate Cr 60 Mg  Tb24 (Isosorbide Mononitrate) .... One Tab By Mouth Daily 7)  Januvia 50 Mg  Tabs (Sitagliptin Phosphate) .... One Tab By Mouth Qday 8)  Prodigy Voice Blood Glucose W/device Kit (Blood Glucose Monitoring Suppl) .... Use Ase Directed. 9)  Prodigy Voice Blood Glucose  Strp (Glucose Blood) .... Use As Directed. Disp 100 10)  Nitrostat 0.4 Mg Subl (Nitroglycerin) .... Place 1 Tablet Under Tongue As Directed 11)  Lotrisone 1-0.05 % Crea (Clotrimazole-Betamethasone) .... Apply A Small Amount To Skin Twice A Day. Disp 60 G 12)  Hydrocortisone 2.5 % Crea (Hydrocortisone) .... Apply To Affected Areas Twice A Day. Disp 60  G.  Allergies (verified): 1)  ! Advair Diskus (Fluticasone-Salmeterol)  Review of Systems  The patient denies fever, weight loss, weight gain, chest pain, dyspnea on exertion, peripheral edema, prolonged cough, abdominal pain, and severe indigestion/heartburn.    Physical Exam  General:  Welll appearing, well groomed. Lungs:  normal respiratory effort and normal breath sounds.   Heart:  normal rate and regular rhythm.   Extremities:  no edema   Impression & Recommendations:  Problem # 1:  PERSON LIVING ALONE (ICD-V60.3) In consultation with primary MD, changed med list so that she is only has to take meds once daily.  The way that her current meds are packaged does not identify which pill is which so was unable to make up a pill box for her.  Recommend referral to the [facility] so that she can stay in her home and recieve he servies necessary.  In the meantime will cancel the mail order process, to prevent delivery for June.  Problem # 2:  URINARY INCONTINENCE (ICD-788.30) Lilely made worse by furosedmide, and recircluating fluid at night.  Using incontinence pads.    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  A1C has been in the 7% range for years.  She is unable to check sugars as she does not have supplies, had been receiving through [facility]. Her updated medication list  for this problem includes:    Aspirin Ec 81 Mg Tbec (Aspirin) .Marland Kitchen... Take 1 tablet by mouth once a day    Januvia 50 Mg Tabs (Sitagliptin phosphate) ..... One tab by mouth qday  Problem # 4:  ESSENTIAL HYPERTENSION, BENIGN (ICD-401.1) Discontinue ARB, on low dose. Her updated medication list for this problem includes:    Toprol Xl 25 Mg Xr24h-tab (Metoprolol succinate) ..... One tab by mouth qhs  Problem # 5:  CONSTIPATION (ICD-564.00) Uses PEG as needed and had a large supply.  Problem # 6:  MACULAR  DEGENERATION (ICD-362.50) Uses magnifier and still has difficulty.  Gave new list of meds in 24 font and she was able to read it with her magnifier.  Problem # 7:  HYPERCHOLESTEROLEMIA (ICD-272.0) Continue for now. Her updated medication list for this problem includes:    Simvastatin 40 Mg Tabs (Simvastatin) ..... One tab by mouth qhs  Problem # 8:  DEPRESSION, MAJOR, RECURRENT (ICD-296.30) She considers Zoloft as beneficial.  Complete Medication List: 1)  Aspirin Ec 81 Mg Tbec (Aspirin) .... Take 1 tablet by mouth once a day 2)  Famotidine 20 Mg Tabs (Famotidine) .... Take 1 tablet by mouth twice a day 3)  Toprol Xl 25 Mg Xr24h-tab (Metoprolol succinate) .... One tab by mouth qhs 4)  Zoloft 100 Mg Tabs (Sertraline hcl) .... Take 1 tablet by mouth once a day 5)  Simvastatin 40 Mg Tabs (Simvastatin) .... One tab by mouth qhs 6)  Isosorbide Mononitrate Cr 60 Mg Tb24 (Isosorbide mononitrate) .... One tab by mouth daily 7)  Januvia 50 Mg Tabs (Sitagliptin phosphate) .... One tab by mouth qday 8)  Prodigy Voice Blood Glucose W/device Kit (Blood glucose monitoring suppl) .... Use ase directed. 9)  Prodigy Voice Blood Glucose Strp (Glucose blood) .... Use as directed. disp 100 10)  Nitrostat 0.4 Mg Subl (Nitroglycerin) .... Place 1 tablet under tongue as directed 11)  Lotrisone 1-0.05 % Crea (Clotrimazole-betamethasone) .... Apply a small amount to skin twice a day. disp 60 g 12)  Hydrocortisone 2.5 % Crea (Hydrocortisone) .... Apply to affected areas twice a day. disp 60 g.  Appended Document: Orders Update     Clinical Lists Changes  Orders: Added new Service order of No Charge Patient Arrived (NCPA0) (NCPA0) - Signed

## 2010-07-18 NOTE — Progress Notes (Signed)
Summary: phn msg   Phone Note From Other Clinic Call back at [phone]   Caller: Forde Radon- Tim Summary of Call: needs a call back to assist with medicines Initial call taken by: De Nurse,  Nov 15, 2009 10:12 AM  Follow-up for Phone Call        SPOKE WITH HER NURSE FROM [facility]. pt has  prefilled meds from her pharmacy. But they are all in a pack for the day;it does not split it out into 8am, 10 am, bedtime, etc.  she does not take meds at proper times when aide is not there. he is going to try to work with pharamcy to get things packed differently. pt can see with a magnifying glass.   does not check her cbgs either, due to poor vision.  He wants her to have PT & OT.  needs [facility]. needs more hours than [facility]. will need [facility] for more hours.   will ask MD to write orders for PT & OT  I called [facility] [phone] to ask for this. Had to leave a message.  I called [facility] [phone] to make a referral.  had to leave a message Follow-up by: Golden Circle RN,  Nov 15, 2009 10:25 AM  Additional Follow-up for Phone Call Additional follow up Details #1::        at this point, all her meds are once a day, except famotidine, which is twice a day. that is as simple as we could make it. order for PT/OT in.  Additional Follow-up by: Lequita Asal  MD,  Nov 15, 2009 3:53 PM    [facility] called back. they are not taking any referrals at this time. possibly end of July.Marland KitchenMarland KitchenGolden Circle RN  Nov 15, 2009 11:06 AM

## 2010-09-26 LAB — TROPONIN I: Troponin I: 0.01 ng/mL (ref 0.00–0.06)

## 2010-09-26 LAB — URINALYSIS, ROUTINE W REFLEX MICROSCOPIC
Bilirubin Urine: NEGATIVE
Bilirubin Urine: NEGATIVE
Glucose, UA: NEGATIVE mg/dL
Nitrite: NEGATIVE
Nitrite: NEGATIVE
Specific Gravity, Urine: 1.015 (ref 1.005–1.030)
Specific Gravity, Urine: 1.019 (ref 1.005–1.030)
Urobilinogen, UA: 0.2 mg/dL (ref 0.0–1.0)
pH: 7.5 (ref 5.0–8.0)

## 2010-09-26 LAB — CBC
HCT: 37.2 % (ref 36.0–46.0)
MCHC: 32.7 g/dL (ref 30.0–36.0)
MCV: 96.3 fL (ref 78.0–100.0)
MCV: 96.7 fL (ref 78.0–100.0)
Platelets: 169 10*3/uL (ref 150–400)
Platelets: 179 10*3/uL (ref 150–400)
RDW: 15.4 % (ref 11.5–15.5)
WBC: 10.2 10*3/uL (ref 4.0–10.5)
WBC: 9.6 10*3/uL (ref 4.0–10.5)

## 2010-09-26 LAB — COMPREHENSIVE METABOLIC PANEL
AST: 19 U/L (ref 0–37)
Albumin: 3.4 g/dL — ABNORMAL LOW (ref 3.5–5.2)
BUN: 30 mg/dL — ABNORMAL HIGH (ref 6–23)
CO2: 28 mEq/L (ref 19–32)
Calcium: 9.3 mg/dL (ref 8.4–10.5)
Calcium: 9.4 mg/dL (ref 8.4–10.5)
Creatinine, Ser: 1.26 mg/dL — ABNORMAL HIGH (ref 0.4–1.2)
Creatinine, Ser: 1.31 mg/dL — ABNORMAL HIGH (ref 0.4–1.2)
GFR calc Af Amer: 47 mL/min — ABNORMAL LOW (ref >60)
GFR calc Af Amer: 49 mL/min — ABNORMAL LOW (ref >60)
GFR calc non Af Amer: 39 mL/min — ABNORMAL LOW (ref >60)
Glucose, Bld: 99 mg/dL (ref 70–99)
Total Bilirubin: 0.6 mg/dL (ref 0.3–1.2)
Total Protein: 6.3 g/dL (ref 6.0–8.3)

## 2010-09-26 LAB — DIFFERENTIAL
Basophils Absolute: 0 10*3/uL (ref 0.0–0.1)
Eosinophils Relative: 1 % (ref 0–5)
Lymphocytes Relative: 11 % — ABNORMAL LOW (ref 12–46)
Lymphocytes Relative: 14 % (ref 12–46)
Lymphs Abs: 1.1 10*3/uL (ref 0.7–4.0)
Lymphs Abs: 1.4 10*3/uL (ref 0.7–4.0)
Monocytes Absolute: 0.5 10*3/uL (ref 0.1–1.0)
Monocytes Relative: 6 % (ref 3–12)
Neutro Abs: 7.5 10*3/uL (ref 1.7–7.7)
Neutrophils Relative %: 81 % — ABNORMAL HIGH (ref 43–77)

## 2010-09-26 LAB — URINE MICROSCOPIC-ADD ON

## 2010-09-26 LAB — CK TOTAL AND CKMB (NOT AT ARMC)
Relative Index: INVALID (ref 0.0–2.5)
Total CK: 33 U/L (ref 7–177)

## 2010-09-26 LAB — LIPASE, BLOOD: Lipase: 22 U/L (ref 11–59)

## 2010-09-27 LAB — BASIC METABOLIC PANEL
Calcium: 9.1 mg/dL (ref 8.4–10.5)
GFR calc Af Amer: 52 mL/min — ABNORMAL LOW (ref >60)
GFR calc non Af Amer: 43 mL/min — ABNORMAL LOW (ref >60)
Glucose, Bld: 155 mg/dL — ABNORMAL HIGH (ref 70–99)
Sodium: 141 mEq/L (ref 135–145)

## 2010-09-27 LAB — GLUCOSE, CAPILLARY: Glucose-Capillary: 158 mg/dL — ABNORMAL HIGH (ref 70–99)

## 2010-10-31 NOTE — Assessment & Plan Note (Signed)
[facility]                            CARDIOLOGY OFFICE NOTE   [name], TORRICO                        MRN:          [id number]  DATE:02/11/2008                            DOB:          [birth year]    [name] returns today for followup.  She has known coronary  artery disease.  She has had a stent to the right coronary artery and  diffuse OM disease.  Her last cath was in 2008.  Her medical therapy was  warranted.  She had a low-risk Myoview in February 2009, with lateral  wall infarct and mild peri-infarct ischemia.  She continues to take  nitro on occasion; however, her pain is nonexertional.  It tends to  occur at night.  She takes 1 or 2 nitro and then goes back to sleep.  She may have had 3-4 episodes a month since I saw her in February.  I  think her clinical syndrome is stable.  [name] continues to be excitable.  She has issues regarding the pharmacy and her medications.  We spent  quite a bit of time talking about this.  Apparently, the pharmacy will  substitute the generic drug and she gets confused about when and how to  take it.  I talked to her daughter at length and suggested that she get  a weekly pillbox.   We will also write out an exact list of her medications what they are  for and when she is supposed to take them.   The patient outside of some atypical pain is not having exertional  dyspnea, palpitations, PND, or orthopnea.  There has been no syncope.   Her review of systems is remarkable for continued issues regarding  multiple medications.  She does not like taking her Januvia.  She says  her sugars have been running in a fairly good range.  She says sometimes  70-80s and sometimes 140s.  I do not have a hemoglobin A1c from Dr.  Iven Finn.  She is also concerned about her bowels.  She would appear to  have some gastroparesis as she is on Reglan.  I tried to explain that  the Reglan and stool softeners before this, but  she continues to  complain of constipation.   Outside of the issues regarding confusion with her medications, [name]  seems to be doing fairly well.   ALLERGIES:  She is allergic to SULFA.   She is supposed to be on,  1. Metoprolol 25 mg half a tablet b.i.d.  2. Aspirin a day.  3. Imdur 60 b.i.d.  4. Zocor 20 a day.  5. Zoloft 100 nightly.  6. Cozaar 25 a day.  7. Combivent.  8. Metformin 1 g b.i.d.  9. Stool softener.  10.Pravastatin 40 a day.  11.Januvia 50 mg a day.  12.Reglan t.i.d.   PHYSICAL EXAMINATION:  GENERAL:  Remarkable for a talkative white female  in no distress.  VITAL SIGNS:  Weight is 166, blood pressure is 116/74, pulse 76 and  regular, respiratory rate 14, afebrile.  HEENT:  Unremarkable.  NECK:  Carotids normal without bruit and no lymphadenopathy or  thyromegaly.  JVP elevation.  LUNGS:  Clear diaphragmatic motion.  No wheezing.  HEART:  S1 and S2 with distant heart sounds.  PMI not palpable.  ABDOMEN:  Protuberant.  Bowel sounds positive.  No AAA, no tenderness,  no bruit, and no hepatosplenomegaly or hepatojugular reflux.  No  tenderness.  EXTREMITIES:  Distal pulses are intact.  No edema.  NEURO:  Nonfocal.  SKIN:  Warm and dry.  MUSCULOSKELETAL:  No muscular weakness.   EKG shows sinus rhythm with T-wave inversions in I and L, and  nonspecific ST-T wave changes.   IMPRESSION:  1. Coronary artery disease.  Previous stent to distal right coronary      artery with diffuse OM disease.  Continue aspirin, beta-blocker,      and nitrates.  2. Hypercholesterolemia in the setting of coronary artery disease.      Continue pravastatin and lipid and liver profile in 6 months.  3. Diabetes.  Continue metformin and Januvia.  Hemoglobin A1c      quarterly.  Follow up primary care MD.  4. Anxiety and depression.  Continue Zoloft.  I explained to the      patient that she should take this at night to help with her      insomnia.  5. History of reflux.   Continue H2 blocker.  6. Previous smoker with chronic obstructive pulmonary disease.      Continue Combivent 2 puffs daily.   We spent considerable time with [name] going over her medications and  making sure she had a printed list with when she is supposed to take the  drugs and also what they are for, and in particular to make sure she  took her statin drug at night.     [name]. Eden Emms, MD, [facility]  Electronically Signed    PCN/MedQ  DD: 02/11/2008  DT: 02/12/2008  Job #: [id number]

## 2010-10-31 NOTE — Assessment & Plan Note (Signed)
[facility]                            CARDIOLOGY OFFICE NOTE   SHAWNTEE, MAINWARING                        MRN:          [id number]  DATE:03/26/2007                            DOB:          1922-01-25    SUBJECTIVE:  Arianny returns today for followup.  She has a history of  distant lateral wall MI with percutaneous transluminal coronary  angioplasty and stenting of the obtuse marginal.  She was cathed in 2006  and medical therapy was recommended.  Her last Myoview in August of 2007  showed lateral wall infarct with no significant ischemia and an ejection  fraction of 62%.   Maribella has been doing fairly well.  She has occasional chest pains.  She  maybe takes one to two nitroglycerin a month.  On occasion she is taking  two.  The pains don't necessarily sound anginal but Ziomara has been doing  this for quite some time.  She in general gets around with a walker  without significant chest pain or dyspnea.  She has had no palpitations.  She has some chronic dizziness from vertigo but no frank syncope or  palpitations.   She has been compliant with her medications.  She probably needs to have  a followup Myoview in March, given her diabetes and known coronary  disease with occasional chest pain.   REVIEW OF SYSTEMS:  The patient's review of systems is remarkable for  what sounds like some irritable bowel syndrome.  She may have  gastroparesis.  Food seems to get stuck a little bit in her esophagus  and she has profound constipation from eating cheese.   The patient has otherwise been doing well.   MEDICATIONS:  1. Lasix 20 mg daily.  2. Aspirin daily.  3. Imdur 60 b.i.d.  4. Zocor 20 a day.  5. Zoloft 100 daily.  6. Glucophage 1 gram b.i.d.  7. Cozaar 25 daily.  8. Combivent.  9. Metformin 1 gram.  10.Glucophage 1 gram b.i.d.   PHYSICAL EXAMINATION:  GENERAL APPEARANCE:  Her exam is remarkable for  an elderly white female in no distress.  Affect  is jovial.  VITAL SIGNS:  Weight is 164.  Blood pressure is 105/60. Pulse is 84 and  regular.  Respiratory rate is 14.  She is afebrile.  HEENT:  Normal.  NECK:  Supple.  There is no lymphadenopathy, no thyromegaly, no JVP  elevation, no bruits.  LUNGS:  Clear, good diaphragmatic motion, no wheezing.  HEART:  There is an S1, S2 with a soft systolic murmur.  PMI is normal.  ABDOMEN:  Benign.  Bowel sounds positive.  No distention.  No  tenderness.  No hepatosplenomegaly.  No hepatojugular reflux.  She is  status post cholecystectomy.  EXTREMITIES:  Femoral's are +3 bilaterally without bruits.  Posterior  tibial's are +2.  There is no lower extremity edema.  NEUROLOGICAL:  Nonfocal.  She dos have a bit of dementia.  There is no  muscular weakness.  SKIN:  Warm and dry.   IMPRESSION:  1. History of lateral wall myocardial infarction, occasional  chest      pain that does not sound changed at all.  Nonischemic Myoview about      one year ago.  Followup Myoview in March.  Continue current therapy      with long-acting nitrates.  The patient currently is not on a beta      blocker, however, her resting heart rate is 60 and I think she      would do best without it.  2. Lower extremity edema, currently stable, resolved with low salt      diet and current dose of Lasix.  She appears euvolemic.  3. Severe constipation.  The patient was told to avoid cheese.  She      will go to the drugstore with her daughter to get some Milk of      Magnesia and Dulcolax suppositories.  She already has a stool      softener.  She was  encouraged to followup with her primary care      M.D.  She has had reflux in the past and this is usually taken care      of by Pepcid.  The patient may benefit from Reglan in the future      due to possible diabetic gastroparesis.  I will leave this up to      her primary care doctor to look into.  4. Hypertension.  Currently well controlled.  Continue Cozaar 25 a      day.   No end-organ damage with normal creatinine.  5. Diabetes.  Followup primary care M.D.  Hemoglobin A1C quarterly.      Continue metformin.  6. Hypercholesterolemia in the setting of coronary disease.  Continue      Zocor 20 a day and followup lipid and liver profile in six months.     Noralyn Pick. Eden Emms, MD, [facility]  Electronically Signed    PCN/MedQ  DD: 03/26/2007  DT: 03/27/2007  Job #: [id number]

## 2010-10-31 NOTE — Assessment & Plan Note (Signed)
[facility]                            CARDIOLOGY OFFICE NOTE   CECILA, SATCHER                        MRN:          [id number]  DATE:08/13/2007                            DOB:          [birth year]    [name] returns today for follow-up.  She has had a distant history of  lateral MI.  She had a Myoview study today.  I personally reviewed the  study, time 10 minutes.  She has a dense lateral wall infarction with a  normal EF.  There is no ischemia.  Sreenidhi is her usual histrionic self.  She is doing well.  She has not had any significant chest pain, PND,  orthopnea.  She ambulates well with a walker.  The patient seemed to be  a little excitable about her diabetes medications.  She wanted to go  over these.  We called over to [facility] to find out what was going on.  She was normally on Glucophage 1 g b.i.d., and she has been started on  Januvia.  I went over this with her.   From a cardiac perspective, she is otherwise stable.  She is not having  palpitations, PND, orthopnea, no chest pain, no syncope. Review of  systems otherwise negative.  Her constipation has improved.   Her medications include:  1. Lasix 20 a day.  2. Aspirin a day.  3. Imdur 60 b.i.d.  4. Zocor 20 a day.  5. Zoloft 100 a day.  6. Glucophage 1 g b.i.d.  7. Januvia dose unknown.  8. Cozaar 25 a day.  9. Combivent.  10.Metformin 1 g b.i.d.   Her exam is remarkable for an excitable elderly white female in no  distress.  Affect is jovial.  Weight is 158, blood pressure is 140/80,  pulse is 88 and regular, respiratory 14.  HEENT:  Unremarkable.  Carotids normal without bruit.  No  lymphadenopathy, thyromegaly, JVP elevation.  LUNGS:  Clear.  Good diaphragmatic motion.  No wheezing.  S1-S2 with  normal heart sounds.  PMI normal.  ABDOMEN:  Benign.  Bowel sounds positive.  No AAA.  No tenderness.  No  hepatosplenomegaly, no hepatojugular reflux.  Distal pulses are intact, no  edema.  SKIN:  Warm and dry.  NEUROLOGICAL:  Nonfocal.  No muscular weakness.   IMPRESSION:  1. Coronary artery disease.  Previous lateral wall myocardial      infarction, nonischemic Myoview.  Continue aspirin and nitrate.  2. Hyperlipidemia in the setting of diabetes and coronary disease.      Continue Zocor 20 a day, lipid liver profile in 6 months.  3. Diabetes.  Januvia added.  Check hemoglobin A1c in 3 months.      Target 6.  Continue low cholesterol diet.  We will send her over to      [facility] for nutrition consult.  4. Previous constipation.  Improved on Colace 100 b.i.d.  Continue to      avoid pain medicine.  5. Lower extremity edema, stable.  Continue Lasix 20 a day, low-salt      diet.  I will see Chyler back in about 6 months.     Noralyn Pick. Eden Emms, MD, [facility]  Electronically Signed    PCN/MedQ  DD: 08/13/2007  DT: 08/14/2007  Job #: [id number]

## 2010-11-03 NOTE — H&P (Signed)
[name], [name]                 ACCOUNT NO.:  [id number]   MEDICAL RECORD NO.:  [id number]          PATIENT TYPE:  INP   LOCATION:  3040                         FACILITY:  [facility]   PHYSICIAN:  Adrian Blackwater, MDDATE OF BIRTH:  1921-10-01   DATE OF ADMISSION:  05/04/2006  DATE OF DISCHARGE:                                HISTORY & PHYSICAL   CHIEF COMPLAINT:  Dizziness.   HISTORY OF PRESENT ILLNESS:  [name] is an 77 year old white female  patient who presented to the ED complaining of being dizzy since last  Thursday.  Every time she changed position, looked to the side or sat up and  tried to look around, the sensation of movement is worsening and associated  with nausea this morning.  She denies any cold symptoms.  She has been  afebrile.  Normal voiding and defecation.  She may refer occasional tinnitus  on the right ear without any ear pain.  She has a history of chronic  tympanic membrane perforation.  She has improved after meclizine treatment  in the ER, but still unable to ambulate without any symptoms.   REVIEW OF SYSTEMS:  CONSTITUTIONAL:  No recent weight changes.  CARDIOVASCULAR:  Denies chest pain, no shortness of breath on exertion.  PULMONARY:  Denies shortness of breath.  No cough.  Afebrile.  GI:  Denies  nausea, vomiting, or diarrhea.  GENITOURINARY:  Denies dysuria.  SKIN:  No  lesions.   PROBLEM LIST:  1. Diabetes mellitus type 2 with complication.  2. Hyperlipidemia.  3. Major depression.  4. Macular degeneration.  5. Coronary artery disease.  6. Gastroesophageal reflux disease.  7. Intermittent claudication.  8. Obesity.  9. History of myocardial infarction.  10.Arthritis.   MEDICATION LIST:  1. Actos 20 mg p.o. daily.  2. Aspirin 81 mg p.o. daily.  3. Cozaar 25 mg p.o. daily.  4. Glucophage 1000 mg b.i.d.  5. Imdur 6 mg p.o. b.i.d.  6. Lasix 20 mg p.o. daily.  7. Nitrostat 0.6 mg as needed.  8. Toprol XL 25 mg daily.  9. Zocor 20 mg  p.o. daily.  10.Zoloft 100 mg daily at bedtime.  11.Zyrtec 10 mg p.o. daily as needed.  12.Lyrica 25 mg p.o. b.i.d.   ALLERGIES:  SULFA DRUGS.   PROCEDURES:  1. Left nephrectomy secondary to staghorn.  2. Umbilical hernia repair.  3. 2-D echo on January 22, 2006, showed left ventricular ejection fraction      of 50-55% and a small pericardial effusion.  4. Cardiac cath in 2005 by Dr. Antoine Poche, recommended medical therapy.  5. Renal ultrasound in May 2001 within normal limits.  6. ABI 0.79 in June 2003.  7. Cardiolite in August 2007, ejection fraction 62%, no ischemia, old      lateral infarct.  8. P-score on DEXA -0.58, low risk, in January 2006.   SOCIAL HISTORY:  She is a widow, lives alone.  She receives health care from  [facility] 5 hours daily since 2004.  She has 7 children, some of them  living in [place].  She denies any tobacco,  alcohol, or drug use.   PHYSICAL EXAMINATION:  VITAL SIGNS:  T-max 97, heart rate 81, blood pressure  131/69, respiratory rate 24, O2 of 95% on room air.  GENERAL APPEARANCE:  No acute distress.  MENTAL STATUS:  Alert and oriented x3.  Fluent speech.  Normal  confrontation.  Following commands.  HEENT:  Normocephalic.  Pupils are equal and reactive to light and  accommodation.  Intact extraocular movements.  No nystagmus present.  There  is mild perforation of the left tympanic membrane with scarring.  Oropharynx  normal.  CHEST/BREASTS:  Nontender.  No bruises.  RESPIRATORY:  Clear to auscultation bilaterally.  CVS:  S1, S2 normal.  RRR.  No murmur, rubs, or gallops.  ABDOMEN:  Soft.  Positive bowel sounds.  Nontender, nondistended.  No  hepatosplenomegaly.  EXTREMITIES:  No edema.  Intact tactile proprioception sensation.  No  clonus.  Pulses palpable symmetrically.  Capillary refill less than 2  seconds.  GENITAL/RECTAL:  Deferred.  NEUROLOGIC:  The patient is alert and oriented x3 with fluent speech.  Very  interactive.  Following  commands appropriately.  Cranial nerves II-XII  intact.  Strength 5/5 bilaterally.  DTRs 2+.  No nystagmus resting nor  associated with head positional changes, but dizziness reproduced by  maneuver.  BALANCE/GAIT:  __________, finger-to-nose and heel-to-shin negative.  SKIN:  No lesions.   LABORATORY TESTS:  Urinalysis negative.  WBC 7, hemoglobin 13.4, platelets  191.  Creatinine 1.1, sodium 139, potassium 4.8, glucose 118.   Head CT shows atrophy and chronic small vessel ischemic changes.  No acute  findings.   ECG:  Normal sinus rhythm, low voltage, nonspecific T-wave abnormality,  unchanged from February 27, 2005.   ASSESSMENT:  [name] is an 77 year old white female patient of Dr.  Altamese Cabal who presented to the emergency room with a 2-day history of  dizziness and occasional nausea.   PLAN:  1. Dizziness.  Differential diagnosis vertigo, presyncopal,      dysequilibrium, psychiatric disorders, CVA, METS.  The most probable      diagnosis in her case is vertigo.  Per patient's report of symptoms,      sensation of movement around triggered by sudden head position changes.      No nystagmus present on examinations, but these are triggered by lying      down and bending the neck back to the cup.  There are no hemodynamic      changes during the maneuver, which rules against presyncopal nature,      also visual impairment and chronic sclerosal tympanic membrane may have      played a role on presentation.  Central nervous system cause rule out.      CT scan obtained did not show any acute findings.  METS were reviewed      and electrolytes were normal.  2. Diabetes mellitus type 2.  Will check hemoglobin A1c, continue Actos      and Glucophage, plus sliding-scale insulin.  3. Hyperlipidemia.  Continue Zocor.  4. Coronary artery disease and PAD.  Continue medical management.  5. GERD.  PPI if symptomatic.  6. Depression.  Stable.     Adrian Blackwater,  MD     IM/MEDQ  D:  05/07/2006  T:  05/08/2006  Job:  [id number]

## 2010-11-03 NOTE — Discharge Summary (Signed)
NAMEBRYER, GOTTSCH                           ACCOUNT NO.:  [id number]   MEDICAL RECORD NO.:  [id number]                   PATIENT TYPE:  INP   LOCATION:  3707                                 FACILITY:  [facility]   PHYSICIAN:  Rebecca L. Jolinda Croak, M.D.          DATE OF BIRTH:  30-Jul-1921   DATE OF ADMISSION:  09/03/2003  DATE OF DISCHARGE:  09/05/2003                                 DISCHARGE SUMMARY   DISCHARGE DIAGNOSES:  1. Vomiting, resolved.  2. Dehydration, resolved.  3. Coronary artery disease.  4. Diabetes mellitus.  5. Musculoskeletal pain.   DISCHARGE MEDICATIONS:  The patient is to continue all home medications,  which include:  1. Actos 30 mg daily.  2. Aspirin 85 mg daily.  3. Atrovent two puffs q.i.d. with spacer.  4. Cozaar 25 mg daily.  5. Glucophage 1000 mg b.i.d.  6. Imdur 60 mg b.i.d.  7. Nitrostat p.r.n.  8. Toprol XL 25 mg one tablet p.o. daily.  9. Zocor 20 mg daily.  10.      Zoloft 100 mg q.h.s.  11.      Tylenol 500 mg one p.o. daily p.r.n.   PAIN MANAGEMENT:  She is to use Tylenol 650 mg every six hours as needed.   DIET:  Should be carbohydrate modified.   FOLLOWUP:  Can call the office on Monday for a followup appointment with Dr.  Lazarus Salines in one to two weeks.   HOSPITAL COURSE:  Ms. Charbonneau is a 77 year old female with a history of  diabetes, hypercholesterolemia, and prior MI, who presented with sharp chest  pain while lying in bed.  The patient reports taking nitroglycerin and said  that the pain went away.  She then ate breakfast.  After breakfast, the  patient felt nauseated and then vomited several times.  The patient came in  that evening.  She denies any diarrhea or abdominal pain.  The patient had a  chest x-ray which showed bronchitic-type changes, some atelectasis, and some  scarring in the left mid lung.  She also had an EKG performed which showed a  rate of 75 beats per minute with a normal access and some questionable  flipped Ts in  V6 and lead 1.  The patient has a known left wall infarction.  Thus, the following problems were adjusted during admission.   #1 - NAUSEA:  Suspect that this is most likely a viral gastritis.  We gave  her symptomatic control even though the patient's nausea improved over  admission.   #2 - DEHYDRATION:  The patient was given some IV fluids overnight and  responded quite well.  We then encouraged p.o. and KVO her IV fluids and the  patient tolerated this.   #3 - DIABETES:  The patient's A1C was 5.9 that month.  Thus, her diabetes  had been under excellent control with her current regimen.  We did hold her  Glucophage during  admission in case she needed studies performed, but  continued her Actos.  She was given instructions to continue both at  discharge.   #4 - ANGINA:  The patient did have that brief episode on the day of  admission.  She had three cardiac panels performed, all of which were  negative.  She does have known coronary artery disease with a recent  catheterization that was performed in March of 2005, which did show some  obstruction of the obtuse marginal.  The patient is currently on medical  therapy.  We did not feel that this was an acute coronary event.   #5 - MUSCULOSKELETAL PAIN:  The patient did complain of intermittent neck  pain that presented the second day of admission.  She was given Tylenol  p.r.n. as it was felt that it was most likely muscle strain.   #6 - POSITIVE URINALYSIS:  The patient was asymptomatic at that time, so we  suspected colonization and we did not treat at this time.  We did send the  UA for a culture.   DISCHARGE PHYSICAL EXAMINATION:  GENERAL APPEARANCE:  The patient is very  alert and pleasant.  No longer feeling nausea and without abdominal pain,  chest pain, or shortness of breath.  CHEST:  She had a small fine crackle in the left lower lobe.  CARDIAC:  Regular rate and rhythm.  No murmurs.  ABDOMEN:  Soft, nontender, and  nondistended with normal bowel sounds.  EXTREMITIES:  No edema.      Nani Gasser, M.D.                  Randon Goldsmith. Jolinda Croak, M.D.    CM/MEDQ  D:  09/29/2003  T:  09/30/2003  Job:  [id number]   cc:   Tamika J. Lazarus Salines, M.D.  Fam. Med - Resident - [place], [place] [address]  Fax: [phone]

## 2010-11-03 NOTE — H&P (Signed)
[name], [name]                 ACCOUNT NO.:  [id number]   MEDICAL RECORD NO.:  [id number]          PATIENT TYPE:  INP   LOCATION:  0103                         FACILITY:  [facility]   PHYSICIAN:  Adolph Pollack, M.D.DATE OF BIRTH:  05-Sep-1921   DATE OF ADMISSION:  02/26/2005  DATE OF DISCHARGE:                                HISTORY & PHYSICAL   CHIEF COMPLAINT:  Right upper quadrant pain, nausea, vomiting.   HISTORY OF PRESENT ILLNESS:  This 77 year old female was seen in the [facility] emergency department yesterday complaining of right upper quadrant pain  with nausea and vomiting that was persistent. Apparently, she had an  ultrasound there which demonstrated gallstones but no cholecystitis and she  subsequently was released. She continued to be ill today and had  progressively increasing right upper quadrant abdominal pain and  subsequently requested to go to [facility] by ambulance, but was brought  to [facility] as she was diverted. She was evaluated here in the emergency  department, found to have elevation of white blood cell count, and CT scan  demonstrated pericholecystic inflammatory changes consistent with acute  cholecystitis. The liver function tests were not elevated, amylase and  lipase were normal. I subsequently was asked to see her. She has had smaller  bouts like this but have been self-limited, especially after eating fatty  meals.   PAST MEDICAL HISTORY:  1.  Coronary artery disease status post myocardial infarction.  2.  Hypertension.  3.  Degenerative joint disease.  4.  Gastroesophageal reflux disease.  5.  Type 2 diabetes mellitus.  6.  Depression.  7.  Nephrolithiasis.   PREVIOUS OPERATIONS:  1.  Tubal ligation.  2.  Umbilical hernia repair.  3.  Left nephrectomy.   DRUG ALLERGIES:  None.   MEDICATIONS:  1.  Aspirin daily.  2.  Zoloft 100 mg q.h.s.  3.  Pepcid 20 mg b.i.d.  4.  Toprol-XL 25 mg daily.  5.  Cozaar 25 mg daily.  6.   Glucophage 1000 mg b.i.d.  7.  Actos 30 mg daily.  8.  Isosorbide mononitrate 60 mg b.i.d.  9.  Lasix 20 mg daily.   SOCIAL HISTORY:  She is a former smoker. Denies alcohol use. She does live  by herself but states she is having more and more difficulty taking care of  herself.   REVIEW OF SYSTEMS:  CARDIOVASCULAR:  She does have stable angina. She has  had a stent placed in her heart back in the 1990s, and she identifies Dr.  Eden Emms as her cardiologist. PULMONARY:  She denies any emphysema, pneumonia,  or asthma. GI:  She denies any peptic ulcer disease or hepatitis. She does  have chronic constipation. GU:  No further kidney stones other than the one  mentioned in the past medical history. ENDOCRINE:  She denies any thyroid  disease or hypercholesterolemia. NEUROLOGIC:  She denies strokes or  seizures. HEMATOLOGIC:  She denies any bleeding disorders, blood clots, or  transfusions.   PHYSICAL EXAMINATION:  GENERAL:  An elderly female who appears to be fairly  comfortable at this time.  VITAL SIGNS:  Her temperature is 100.6 with a maximum temperature in the  emergency department of 102.0. Blood pressure 134/77, pulse 96, respiratory  rate 20, O2 saturations 96% in room air.  EYES:  No icterus is present.  NECK:  Supple without obvious masses or thyroid enlargement.  RESPIRATORY:  Breath sounds are equal and clear, respirations unlabored.  CARDIOVASCULAR:  Heart demonstrates a regular rate and regular rhythm. I do  not hear a murmur. Trace pretibial edema is noted. No JVD.  ABDOMEN:  Soft, mildly obese. Lower midline scar and mid transverse scars  are noted without obvious hernia. She has right upper quadrant tenderness  and guarding to palpation. There is a tender fullness present.  BACK:  No CVA tenderness.  EXTREMITIES:  No obvious muscular wasting.   LABORATORY DATA:  Remarkable for white blood cell count of 18,000. Liver  function tests, amylase, lipase within normal limits.  Hemoglobin 12.8.  Urinalysis is negative for infection. Glucose 174.   CT scan was reviewed.   IMPRESSION:  1.  Acute calculus cholecystitis.  2.  Coronary artery disease status post myocardial infarction, has a stent      in place.  3.  Type 2 diabetes mellitus.  4.  Hypertension.   PLAN:  Will admit her to the hospital, start her on IV antibiotics, and  start hydration. Will start her back on her cardiac medicine with a sip of  water. Start her in sliding scale insulin. Will request a cardiology consult  later today to assess her for operative risks. I did talk with her about the  potential for treatment with IV antibiotics versus cholecystectomy versus  percutaneous cholecystostomy. If indeed she had the cholecystectomy, we  talked about the  procedure and the risks. The risks include but are not limited to bleeding,  infection, wound healing problems, cardiopulmonary complications of  anesthesia, bile leak, liver injury, common bile duct injury, and small  intestinal injury. She seems to understand all these and is agreeable with  the plan.      Adolph Pollack, M.D.  Electronically Signed     TJR/MEDQ  D:  02/27/2005  T:  02/27/2005  Job:  [id number]   cc:   Charlton Haws, M.D.  [address]  [address]  [place]  [place] [address]

## 2010-11-03 NOTE — Assessment & Plan Note (Signed)
[facility]                              CARDIOLOGY OFFICE NOTE   [name]                        MRN:          [id number]  DATE:01/03/2006                            DOB:          1922/05/09    [name] returns today for followup.  She has gained quite a bit of weight.  I  think she is eating more bread than usual.  I think her diabetes has been a  little out of control, as well.  She has a history of coronary disease with  previous angioplasty of the obtuse marginal branch.  Her last Cardiolite  showed a large lateral wall infarction with no ischemia.   The patient also has had problems with moderate disease in her LAD.  As I  recall, her EF has been normal at 55%.  Her last cath in 2005 done by Dr.  Antoine Poche suggested medical therapy.  The patient does not have any  significant chest pain.   PHYSICAL EXAMINATION:  GENERAL:  She looks well.  She is not short of  breath.  The weight is up quite a bit from 163 to 173.  VITAL SIGNS:  Blood pressure is 139/80, pulse 74 and regular.  LUNGS:  Clear.  CARDIAC:  Regular rate and rhythm.  S1 and S2 with normal heart sounds.  ABDOMEN:  Benign.  LOWER EXTREMITIES:  Normal pulses.  No edema.   IMPRESSION:  Dyspnea, most likely related to being out of shape and  increased weight gain.  No evidence of acute cardiac problems.  I think it  is reasonable to followup with an adenosine Myoview to make sure there is no  ischemia, and also recheck her left ventricular function by echo.  Her left  ventricular function is reported to be normal previously; however, based on  the extent of her lateral wall defect on her last Myoview, I suspect that it  is at least mildly decreased.  She will continue her current medications  including her beta blocker and Cozaar.   I will see her back in about 6 months, unless her Myoview or echo are high  risk.                               Noralyn Pick. Eden Emms, MD, [facility]    PCN/MedQ  DD:  01/03/2006  DT:  01/03/2006  Job #:  [id number]

## 2010-11-03 NOTE — Op Note (Signed)
Bailey, Joanna                           ACCOUNT NO.:  [id number]   MEDICAL RECORD NO.:  [id number]                   PATIENT TYPE:  OIB   LOCATION:  5741                                 FACILITY:  [facility]   PHYSICIAN:  Alford Highland. Rankin, M.D.                DATE OF BIRTH:  [birth year]   DATE OF PROCEDURE:  01/19/2002  DATE OF DISCHARGE:  01/20/2002                                 OPERATIVE REPORT   PREOPERATIVE DIAGNOSIS:  1. Dense vitreous hemorrhage in the right eye.  2. 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   POSTOPERATIVE DIAGNOSIS:  1. Dense vitreous hemorrhage in the right eye.  2. 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   PROCEDURE:  Posterior vitrectomy with endolaser and photocoagulation right  eye.   SURGEON:  Alford Highland. Rankin, M.D.   ANESTHESIA:  Local retrobulbar and monitored anesthesia control.   INDICATIONS FOR PROCEDURE:  The patient is a 77 year old woman with profound  visual loss in the right eye on the basis of dense diabetic vitreous  hemorrhage despite previous photocoagulation.  This was intended to remove  the medial opacity, restore the best corrected visual acuity, induce  quiescence of peripheral diabetic retinopathy.  She understands the risks of  anesthesia, and wishes to proceed. Risks of ptosis of the eye including  hemorrhage, infection, scarring, need for further surgery, double vision,  loss of vision, progression of disease despite intervention.  After  appropriate signed consent was obtained, the patient was taken to the  operating room.   PROCEDURE IN DETAIL:  In the operating room, appropriate monitoring was  followed by mild sedation.  Marcaine 0.75% delivered 5 cc retrobulbar with  an additional 5 cc laterally and finished with Darel Hong.  The right region  was sterilely prepped and draped in the usual ophthalmic fashion.  A lid  speculum was applied.  A conjunctival peritomy was then  fashioned temporally  and superonasally.  A 4 mm infusion was secured 3.5 mm posterior to the  limbus in the inferotemporal quadrant.  Placement in the vitreous cavity  verified visually.  A superior sclerotomy was then fashioned.  A Wild  microscope was placed into position with __________ attached.  Core  vitrectomy was then begun.  The posterior vitreous tear had was occurred  spontaneously previously.  Dense vitreous hemorrhage was removed.  Vitreous  j using scleral depression.   Preretinal hemorrhage was aspirated with fluted needle.  Endolaser  photocoagulation was then placed 360 degrees.  At this time, the decision  was made to use fluid/air exchange to confirm that the hemostasis was  complete.  Indeed, it was.  The decision was made to leave the air in place.  The superior sclerotomy was then closed with 8-0 Vicryl suture.  The  __________ 7-0 Vicryl suture.  The conjunctiva was closed in layers with 7-0  Vicryl suture.  Subconjunctival injection lid blocks were  placed.  The patient tolerated  the procedure well without complications.  A sterile patch and Fox shield  were applied to the right eye.  She was taken to the recovery room in good,  stable condition.                                               Alford Highland Rankin, M.D.    GAR/MEDQ  D:  01/19/2002  T:  01/22/2002  Job:  [id number]

## 2010-11-03 NOTE — Discharge Summary (Signed)
[name], [name]                 ACCOUNT NO.:  [id number]   MEDICAL RECORD NO.:  [id number]          PATIENT TYPE:  INP   LOCATION:  3040                         FACILITY:  [facility]   PHYSICIAN:  Sylvan Cheese, M.D.       DATE OF BIRTH:  1922-05-27   DATE OF ADMISSION:  05/04/2006  DATE OF DISCHARGE:  05/07/2006                                 DISCHARGE SUMMARY   DISCHARGE DIAGNOSES:  1. Dizziness, likely secondary to vertigo.  2. Diabetes mellitus type 2.  3. Congestive heart failure with an ejection fraction of 50 to 55%.  4. Gastroesophageal reflux disease.  5. Depression.  6. Coronary artery disease with history of myocardial infraction.  7. Hyperlipidemia.  8. Status post cholecystectomy.  9. Status post left nephrectomy.   DISCHARGE MEDICATIONS:  1. Lasix 20 mg p.o. daily.  2. Pepcid 20 mg p.o. daily.  3. Aspirin 81 mg p.o. daily.  4. Imdur 60 mg b.i.d.  5. Nitrostat 0.4 mg sublingually as needed for chest pain.  6. Zocor 20 mg p.o. daily.  7. Zoloft 100 mg p.o. at bedtime.  8. Lactulose 15 to 30 mL as needed for constipation.  9. Glucophage 1000 mg b.i.d.  10.Cozaar 25 mg daily.  11.Zyrtec 10 mg as needed for allergies.  12.Combivent two puffs four times daily.  13.Actos 30 mg p.o. daily.  The patient's primary care physician may      consider stopping this medication given her diagnosis of CHF.  Of note, the patient has been instructed to stop her home dose of Toprol  given her hypotension throughout admission.  The patient's primary physician  may reassess the need for this medication at followup.   CONSULTS:  None.   PROCEDURES:  A head CT was obtained on May 04, 2006 that showed atrophy  and chronic small-vessel ischemic changes without any acute abnormality.   HOSPITAL COURSE:  [name] is an 77 year old white female with multiple  medical problems who presented with a-several-day history of dizziness.  Her  symptoms were most consistent with vertigo as  she said that it felt as if  the room were spinning and that she were being pulled down towards the  ground.  She also said that the dizziness was worse with some positions.  She had not actually fallen or had any type of syncopal episode, however.   Problem:  1. Dizziness:  Likely secondary to vertigo.  She was started on meclizine      12.5 mg p.o. b.i.d. and reported significant improvement in her      dizziness at the time of discharge.  She was evaluated by Physical      Therapy and given several tips for maintaining balance to prevent falls      with her onset of vertigo.  Physical Therapy recommended placement in a      short-term skilled nursing facility since Ms. Michelin lives alone.      However, Ms. Heying refused short-term placement and is considered      competent to make these types of decisions for herself.  She does live      in an apartment with a call bell for help and has in-home nursing aid 5      days per week for 3 hours daily.  She was provided with a rolling      walker to help with her unsteady gait and has also been set up for Home      Health Physical Therapy to come to her house.  She also has several      daughters that live in the area who she says checks on her frequently.      Of note, a head CT was obtained to rule out organic causes of dizziness      and this study was negative.  2. CHF:  The patient has a history of congestive heart failure with an      ejection fraction of 50 to 55% in August, 2007.  At home she is on      Lasix, Toprol XL 25 mg daily, aspirin 81 mg daily, and Cozaar 25 mg      daily.  Notably, her blood pressure tended to run with systolic      pressures in the 100s throughout admission.  She was instructed to stop      her home dose of Toprol XL due to these low blood pressures which may      have also been contributing to her chief complaint of dizziness.  The      patient's primary care physician may consider restarting her beta       blocker if deemed necessary at her follow-up appointment.  3. Diabetes type 2:  The patient was continued on her home dose of Actos      and Glucophage while in the hospital.  A hemoglobin Alc was obtained      and was 6.8%.  Of note, the patient's primary care physician may      consider stopping Actos given her history of congestive heart failure.  4. Hyperlipidemia:  She was continued on her home dose of Zocor while in      the hospital.  5. Depression:  She was continued on her home dose of Zoloft while in the      hospital.   FOLLOWUP INSTRUCTIONS:  Ms. Bungert was instructed to follow up with her  primary physician, Dr. Altamese Cabal, at [facility].  She was instructed to call and set up an appointment.  Her nurses  aid provides her primary means of transportation.           ______________________________  Sylvan Cheese, M.D.     MJ/MEDQ  D:  05/07/2006  T:  05/08/2006  Job:  [id number]   cc:   Altamese Cabal, M.D.

## 2010-11-03 NOTE — Op Note (Signed)
[name], [name]                 ACCOUNT NO.:  [id number]   MEDICAL RECORD NO.:  [id number]          PATIENT TYPE:  INP   LOCATION:  5733                         FACILITY:  [facility]   PHYSICIAN:  Adolph Pollack, M.D.DATE OF BIRTH:  [birth year]   DATE OF PROCEDURE:  02/28/2005  DATE OF DISCHARGE:                                 OPERATIVE REPORT   PREOPERATIVE DIAGNOSIS:  Acute cholecystitis.   POSTOPERATIVE DIAGNOSIS:  Acute cholecystitis.   PROCEDURE:  Laparoscopic cholecystectomy.   SURGEON:  Adolph Pollack, M.D.   ASSISTANT:  Gabrielle Dare. Janee Morn, M.D.   ANESTHESIA:  General.   INDICATIONS:  This is an 77 year old female who was admitted early yesterday  morning with acute cholecystitis. She had been on intravenous antibiotics  and had cardiac clearance and now presents for the above procedure. The  procedure and risks have been discussed with her.   TECHNIQUE:  She is seen in the holding area, then brought to the operating  room, placed supine on the operating table, and the general anesthetic was  administered. The abdominal wall was then sterilely prepped and draped. She  had had previous umbilical and lower abdominal surgery. In the inferior  epigastric region, a small incision was made through the skin, subcutaneous  tissue, fascia, and peritoneum, entering the peritoneal cavity under direct  vision. A pursestring suture of 0 Vicryl was placed around the fascial  edges. A Hasson trocar was introduced into the peritoneal cavity, and a  pneumoperitoneum was created by insufflation of CO2 gas.   Next, the laparoscope was introduced, and she was placed in the steep  reverse Trendelenburg position with her right side tilted up. An 11-mm  trocar was placed through an epigastric incision and two 5-mm trocars placed  in the right upper quadrant. Omentum was adherent to the gallbladder, and  once this was peeled away bluntly, an acutely inflamed, edematous  gallbladder was  noted. Upon grasping the fundus, a small hole was made in  the gallbladder, and purulent material leaked out, and was evacuated by way  of suction. The fundus was then retracted toward the right shoulder. Using  blunt dissection, I dissected the omentum free from the body and  infundibulum of the gallbladder. I used a 30-degree scope for better  visualization.   The infundibulum was then mobilized with dissection directly on the  gallbladder. It was then retracted laterally, and the cystic duct was  identified and a window created around it. A clip was placed to the cystic  duct/gallbladder junction and a small incision made in the cystic duct. The  cystic duct appeared to be of long length. A  Cholangiocath was then passed  through the anterior abdominal wall, placed to the cystic duct, and the  cholangiogram was performed.   Under real-time fluoroscopy, dilute contrast material was injected into the  cystic duct which was indeed of moderate-to-long length. The common hepatic,  right and left hepatic, and common bile ducts were all opacified, and  contrast passed into the duodenum. There was contrast in the colon from a CT  scan  so this limited our visualization of the duodenum, but I did not see  any obvious evidence of obstruction or leak, and the final report is pending  the radiologist's interpretation.   The Cholangiocath was then removed. The cystic duct was clipped three times  proximally an divided. Both an anterior and posterior branch of the cystic  artery were identified. Windows were created around them, and they were  clipped and divided. The gallbladder was dissected free from the liver bed  using electrocautery and then placed in an Endopouch bag.   The gallbladder fossa was copiously irrigated with saline solution and  bleeding points controlled with the cautery. Surgicel was then placed in the  gallbladder fossa and irrigation fluid evacuated as much as possible. A   drain was then placed into the gallbladder fossa, and it exited out the  lateral-most 5-mm incision and anchored to the skin with 3-0 nylon suture.  The gallbladder was then removed in the Endopouch bag through the inferior  epigastric port, and the inferior epigastric fascial incision was closed by  tightening up and tying down the pursestring suture and adding a second 0  Vicryl suture. The perihepatic area was again visualized. No active bleeding  was noted. No bile leak was noted. The remaining trocars were removed, and  the pneumoperitoneum was released. The drain was hooked to bulb suction.   The skin incisions were closed with 4-0 Monocryl subcuticular stitches  followed by Steri-Strips and sterile dressings. She tolerated the procedure  without any apparent complications and was taken to the recovery room in  satisfactory condition.      Adolph Pollack, M.D.  Electronically Signed     TJR/MEDQ  D:  02/28/2005  T:  02/28/2005  Job:  [id number]

## 2010-11-03 NOTE — Discharge Summary (Signed)
NAMEZENIYAH, PEASTER                 ACCOUNT NO.:  [id number]   MEDICAL RECORD NO.:  [id number]          PATIENT TYPE:  INP   LOCATION:  5733                         FACILITY:  [facility]   PHYSICIAN:  Adolph Pollack, M.D.DATE OF BIRTH:  [birth year]   DATE OF ADMISSION:  02/27/2005  DATE OF DISCHARGE:  03/05/2005                                 DISCHARGE SUMMARY   PRINCIPAL DISCHARGE DIAGNOSIS:  Acute gangrenous cholecystitis.   SECONDARY DIAGNOSES:  1.  Coronary artery disease.  2.  Myocardial infarction.  3.  Hypertension.  4.  Degenerative joint disease.  5.  Type 2 diabetes mellitus.  6.  Depression.  7.  Nephrolithiasis.  8.  Gastroesophageal reflux disease.   PROCEDURE:  Laparoscopic cholecystectomy with intraoperative cholangiogram,  February 28, 2005.   CONSULTATIONS:  Dr. Charlton Haws, Cardiology.   REASON FOR ADMISSION:  This 77 year old female presented to the emergency  department with persistent right upper quadrant and mid-abdominal pain with  nausea and vomiting.  She was evaluated and a CT scan was performed.  She  was noted to have a leukocytosis.  CT demonstrated pericholecystic  inflammatory changes consistent with acute cholecystitis; she subsequently  was admitted.   HOSPITAL COURSE:  She was admitted and started on IV antibiotics.  Cardiology saw her and she was then taken to the operating room, where she  underwent a laparoscopic cholecystectomy, which she tolerated well.  Postoperatively, she was restarted on Actos, sliding-scale insulin and PT/OT  consults were obtained.  She was in a deconditioned state and had a very  slow progressive postop course.  The drain was removed on her third  postoperative day.  IV antibiotics were continued.  Potassium was low and  this was supplemented.  She was given a laxative and had a bowel movement,  and by March 05, 2005, she was ready to be discharged.   DISPOSITION:  Discharged home in satisfactory condition  on March 05, 2005.   DISCHARGE MEDICATIONS:  She was going to resume her usual medications.  She  was given Augmentin as an antibiotic and told to take Tylenol for pain.   ACTIVITY:  Activity restrictions were given.   FOLLOWUP:  She will follow up with [place] in the office in approximately 2  weeks.      Adolph Pollack, M.D.  Electronically Signed     TJR/MEDQ  D:  05/07/2005  T:  05/08/2005  Job:  [id number]   cc:   Charlton Haws, M.D.  [address]  [address]  [place]  [place] [address]

## 2010-11-03 NOTE — Consult Note (Signed)
NAMEFAYRENE, [name]                 ACCOUNT NO.:  [id number]   MEDICAL RECORD NO.:  [id number]          PATIENT TYPE:  INP   LOCATION:  5733                         FACILITY:  [facility]   PHYSICIAN:  Olga Millers, M.D. [facility] BIRTH:  1921/10/17   DATE OF CONSULTATION:  02/27/2005  DATE OF DISCHARGE:                                   CONSULTATION   HISTORY OF PRESENT ILLNESS:  [name] is a pleasant 77 year old female with  a past medical history of coronary disease, diabetes mellitus, hypertension,  hyperlipidemia and now with cholecystitis who we are asked to evaluate  preoperatively.  The patient does have a history of PCI of her right  coronary artery.  Her last catherization was performed on Mach 1, 2005. At  that time she was found to have luminal irregularities in the left main.  The LAD had a 25% stenosis followed by a 30% proximal.  The first diagonal  had a long 60%stenosis and the second diagonal was essentially a dual LAD  and had an ostial 70% stenosis.  The mid obtuse marginal had previously been  occluded and at the time of the catherization showed an ostial 90% stenosis  with TIMI 2 flow but was felt to be a difficult vessel to treat  percutaneously.  There was a 40% in-stent restenosis of the right coronary  artery and a 50% PDA.  Her ejection fraction was 55%.  It was felt that  medical therapy was indicated.  Her most nuclear study was performed on November 22, 2004.  Her ejection fraction was 54%.  There was a lateral and apical  infarct but no ischemia.  She recently developed abdominal pain and was  admitted by the surgeons for possible cholecystitis.  We were asked to  evaluate preoperatively.  Of note, she has not had any recent chest pain.  She has chronic dyspnea on exertion and has developed mild pedal edema but  there is no orthopnea or PND.   HOME MEDICATIONS:  1.  Aspirin 81 mg p.o. daily.  2.  Zoloft 100 mg p.o. daily.  3.  Pepcid 20 mg p.o. b.i.d.  4.   Cozaar 25 mg p.o. daily.  5.  Glucophage 1000 mg p.o. b.i.d.  6.  Actos isosorbide 60 mg p.o. b.i.d.  7.  Lasix 20 mg p.o. daily.  8.  Toprol 25 mg p.o. daily.  9.  Zocor 20 mg p.o. daily.   ALLERGIES:  No known drug allergies.   SOCIAL HISTORY:  She has a remote history of tobacco use but none in the  past 20 years.  She does not consume alcohol.   FAMILY HISTORY:  Significant for cancer in both her mother and father.  There is no coronary disease.   PAST MEDICAL HISTORY:  1.  Diabetes mellitus.  2.  Hypertension.  3.  Hyperlipidemia.  4.  She has a history of coronary artery disease as outlined in the HPI.  5.  Gastroesophageal reflux disease.  6.  History of degenerative joint disease.  7.  She has a prior left nephrectomy.  8.  Retinopathy as well as history of cataracts.   REVIEW OF SYSTEMS:  She denies any headaches or productive cough or  hemoptysis.  She has had some fevers at home.  There is no dysphagia,  odynophagia, melena, hematochezia.  There is no dysuria or hematuria.  There  is no rash or seizure activity.  She has had abdominal pain in the right  upper quadrant predominantly but also some shoulder pain.  The remainder of  systems is negative.   PHYSICAL EXAMINATION:  VITAL SIGNS:  Blood pressure 125/75 and her pulse is  99.  She is afebrile.  GENERAL:  She is well-developed and somewhat lethargic from pain  medications here.  However, she is alert and oriented x3.  She is not  depressed. Marland Kitchen  EXTREMITIES:  There is no peripheral clubbing.  HEENT:  Unremarkable with no __________.  NECK:  Supple with normal motion bilaterally and I cannot appreciate bruits.  There is no jugular venous distention and no thyromegaly noted.  CHEST:  Clear to auscultation and normal expansion.  CARDIOVASCULAR:  Reveals a regular rate and rhythm with normal S1 and S2.  I  cannot appreciate murmurs, rubs, or gallops.  ABDOMEN:  Shows marked tenderness in the right upper quadrant.   There is  tenderness diffusely but less prominent compared to the right upper  quadrant.  I cannot palpate masses.  I cannot evaluate for hepatomegaly due  to the extreme tenderness in the right upper quadrant.  There is no  abdominal bruit.  She has 2+ femoral pulses bilaterally and no bruits.  EXTREMITIES:  Show no edema and I can palpate no cords.  She does have  varicosities and chronic skin changes.  Her distal pulses are diminished.  NEUROLOGIC:  Grossly intact.   LABORATORY DATA:  White blood cell count 17.9 with a hemoglobin of 11.9,  hematocrit 35.6.  Her platelet count is 158.  The sodium is 135 with a  potassium of 3.8.  Her BUN and creatinine are 11 and 0.9.  Her liver  functions are normal.  Albumin is 2.8.  Her amylase and lipase are also  normal.  The electrocardiogram is pending at the time of this dictation.   DIAGNOSES:  1.  Acute cholecystitis.  2.  Preoperative evaluation prior to cholecystectomy.  3.  History of coronary disease with recent nuclear study demonstrating      infarct but no ischemia and preserved LV function.  4.  Diabetes mellitus .  5.  Hypertension.  6.  Hyperlipidemia.   PLAN:  [name] has been admitted with cholecystitis.  I would continue her  present cardiac medications preoperatively and postoperatively including her  beta blocker and diuretic.  I would resume her aspirin postoperatively as  well when the surgeons feel comfortable.  She did have a recent nuclear  study showing no ischemia and preserved LV function and she has had no  recent chest pain.  I therefore do not think further preoperative workup is  indicated.  We will be happy to follow while she is in the hospital.          ______________________________  Olga Millers, M.D. [facility]    BC/MEDQ  D:  02/27/2005  T:  02/27/2005  Job:  [id number]

## 2010-11-03 NOTE — Assessment & Plan Note (Signed)
[facility]                            CARDIOLOGY OFFICE NOTE   Joanna, Bailey                        MRN:          [id number]  DATE:09/03/2006                            DOB:          [birth year]    [name] is seen today in followup.  She has been seen by her primary  medical doctors a few times for dizziness.  It took me a while to get  records from Dr. Iven Finn at [facility].  It appears that  her medications have been changed.  She had been on meclizine.  She  complains of daytime drowsiness.  She also takes Zyrtec and Zoloft.   From a cardiac perspective, her medicine changes have been mixed.  She  is off Actos which is good; however, it appears that they stopped her  Toprol.  I think they may have thought it was contributing to her  dizziness.   As far as a I remember, Zeta has never had excessive bradycardia or  heart block.   The patient has coronary artery disease.  She had a subendocardial MI in  1996 with angioplasty of the circ.  She has not had any repeat  catheterizations.  Her last Myoview study was done in September of 2007.  At the time, it showed a lateral infarct with no ischemia.  Her EF is  mildly decreased.   In talking to Shalaine, she does sound like she had a bout of vertigo.  Her  dizziness does not sound arrhythmogenic.   She does get an occasional palpation.  There is no history of a-fib.  She continues to be active.  She lives by herself, but she has had 9  children, 2 of whom have passed, but all 7 children are still in the  area and check in on her, particularly 3 of her daughters.  From a  cardiac standpoint, she has not had any significant angina.  She gets  occasional exertional dyspnea.   CURRENT MEDICATIONS:  1. Meclizine 25 mg as needed for vertigo.  2. An aspirin a day.  3. Combivent 2 puffs 4 times a day.  4. Cozaar 25 a day.  5. Glucophage 1 g b.i.d.  6. Imdur 60 mg a day.  7.  Lasix 20 a day.  8. Pepcid 20 a day.  9. Zocor 20 a day.  10.Zoloft 100 a day.  11.Nitrostat p.r.n.   PHYSICAL EXAMINATION:  The blood pressure is 123/75, pulse is 88 and  regular.  HEENT:  Normal.  Carotids are normal without bruit.  Lungs are clear.  There is an S1, S2 with normal heart sounds.  ABDOMEN:  Benign.  LOWER EXTREMITIES:  Intact pulses, no edema.   IMPRESSION:  Previous coronary artery disease, lateral wall infarct.  Myoview last year which was nonischemic.  I will send this letter to Dr.  Iven Finn.  My only concern would be to restart her Toprol given her  coronary disease and relatively high heart rate.  I do not think that  the Toprol would have caused any dizziness.  It  clearly seems that this  was more of a gait or inner ear problem.   The patient will continue her aspirin a day.  I am glad she is off her  Actos in regards to her volume issues.  She is not having significant  chest pain on Imdur.  She had a low risk Myoview last year.  Since she  is asymptomatic, I do not think she needs a repeat study, probably, for  2-3 years.  She is a diabetes and may have silent ischemia.   The patient will stop taking her Zyrtec during the day.  I told her it  would be fine, to take her Zoloft at night.   I told her if she needed allergy protection during the day, she should  try Claritin.   Currently, she appears euvolemic on her current dose of Lasix.   I will see her back in 6 months' time, and hopefully Dr. Iven Finn will  reevaluate the need for Toprol.     [name]. Eden Emms, MD, [facility]  Electronically Signed    PCN/MedQ  DD: 09/03/2006  DT: 09/04/2006  Job #: [id number]

## 2010-11-03 NOTE — Procedures (Signed)
[place]  Patient:    [name], [name] Visit Number: [id number] MRN: [id number]          Service Type: END Location: ENDO Attending Physician:  Charna Elizabeth Dictated by:   Anselmo Rod, M.D. Proc. Date: 10/01/01 Admit Date:  10/01/2001   CC:         Wayne A. Sheffield Slider, M.D.   Procedure Report  DATE OF BIRTH:  12-02-1921  REFERRING PHYSICIAN:  Wayne A. Sheffield Slider, M.D.  PROCEDURE PERFORMED:  Colonoscopy.  ENDOSCOPIST:  Anselmo Rod, M.D.  INSTRUMENT USED:  Olympus video colonoscope.  INDICATIONS FOR PROCEDURE:  The patient is a 77 year old white female with a family history of colon cancer.  The patient has a history of diabetes and is legally blind.  PREPROCEDURE PREPARATION:  Informed consent was procured from the patient. The patient was fasted for eight hours prior to the procedure and prepped with a bottle of magnesium citrate and a gallon of NuLytely the night prior to the procedure.  PREPROCEDURE PHYSICAL:  The patient had stable vital signs.  Neck supple. Chest clear to auscultation.  S1, S2 regular.  Abdomen soft with normal bowel sounds.  DESCRIPTION OF PROCEDURE:  The patient was placed in the left lateral decubitus position and sedated with 50 mg of Demerol and 3 mg of Versed intravenously.  Once the patient was adequately sedated and maintained on low-flow oxygen and continuous cardiac monitoring, the Olympus video colonoscope was advanced from the rectum to the cecum.  There was a large amount of solid stool throughout the colon.  Multiple washes were done but visualization was not adequate and the procedure had to be aborted at this point with plan to do a repeat prep and repeat colonoscopy at a later date. On discussion with the family, the patients daughter is adamant is not going through this again and will not return probably for a repeat procedure. Dictated by:   Anselmo Rod, M.D. Attending Physician:   Charna Elizabeth DD:  10/01/01 TD:  10/02/01 Job: [id number]

## 2010-11-03 NOTE — Cardiovascular Report (Signed)
NAMETESSY, PAWELSKI                           ACCOUNT NO.:  [id number]   MEDICAL RECORD NO.:  [id number]                   PATIENT TYPE:  OIB   LOCATION:  2899                                 FACILITY:  [facility]   PHYSICIAN:  Rollene Rotunda, M.D.                DATE OF BIRTH:  Nov 03, 1921   DATE OF PROCEDURE:  08/17/2003  DATE OF DISCHARGE:  08/17/2003                              CARDIAC CATHETERIZATION   PROCEDURES PERFORMED:  1. Left heart catheterization.  2. Coronary arteriography.   CARDIOLOGIST:  Rollene Rotunda, M.D.   INDICATIONS:  Evaluate patient with chest pain and an a stress  echocardiogram suggesting lateral inferolateral ischemia.   PROCEDURAL NOTE:  Left heart catheterization was performed via the right  femoral artery.  The artery was cannulated using anterior wall puncture.  Number six French arterial sheath was inserted via the modified Seldinger  technique.  Preformed Judkins and a pigtail catheter were utilized.   The patient tolerated the procedure well and left the lab in stable  condition.   RESULTS:   HEMODYNAMIC DATA:  LV 136/30,  AO 128/91.   ARTERIOGRAPHIC DATA:  Coronaries  Left Main:  The left main had luminal irregularities.   LAD:  The LAD had proximal 25% stenosis and proximal 30% stenosis before the  first diagonal.  The first diagonal was large with a long 60% stenosis.  The  second diagonal was essentially a dual LAD with ostial 70% stenosis.   Circumflex Artery:  The circumflex had diffuse luminal irregularities in the  AV groove.  The mid obtuse marginal was long and narrow in caliber.  It had  previously been occluded.  There was long proximal and ostial 90% stenosis  with TIMI II flow in this vessel that reached the inferior apex.   Right Coronary Artery:  The right coronary artery is large and dominant.  There was a proximal stent with diffuse in-stent stenosis.  This was 25% in-  stent restenosis.  There was mid 40% stenosis.  The  PDA had ostial 50%  stenosis.   VENTRICULOGRAPHIC DATA:  Left Ventriculogram:  The left ventriculogram was  obtained in the RAO projection.  The EF was 55%.   CONCLUSION:  Obstructive obtuse marginal lesion, diffusely disease and  narrow.  This is the symptomatic vessel most likely.   PLAN:  The OM vessel would be difficult to treat percutaneously.  It will be  managed medically.                                               Rollene Rotunda, M.D.    Derinda Sis  D:  08/17/2003  T:  08/18/2003  Job:  [id number]

## 2010-11-27 ENCOUNTER — Emergency Department (HOSPITAL_COMMUNITY): Payer: Medicare (Managed Care)

## 2010-11-27 ENCOUNTER — Inpatient Hospital Stay (HOSPITAL_COMMUNITY)
Admission: EM | Admit: 2010-11-27 | Discharge: 2010-11-28 | DRG: 690 | Disposition: A | Discharge status: Home / Self Care | Payer: Medicare (Managed Care) | Attending: Internal Medicine | Admitting: Internal Medicine

## 2010-11-27 DIAGNOSIS — I252 Old myocardial infarction: Secondary | ICD-10-CM

## 2010-11-27 DIAGNOSIS — F329 Major depressive disorder, single episode, unspecified: Secondary | ICD-10-CM | POA: Diagnosis present

## 2010-11-27 DIAGNOSIS — N390 Urinary tract infection, site not specified: Principal | ICD-10-CM | POA: Diagnosis present

## 2010-11-27 DIAGNOSIS — F3289 Other specified depressive episodes: Secondary | ICD-10-CM | POA: Diagnosis present

## 2010-11-27 DIAGNOSIS — IMO0001 Reserved for inherently not codable concepts without codable children: Secondary | ICD-10-CM | POA: Diagnosis present

## 2010-11-27 DIAGNOSIS — I2510 Atherosclerotic heart disease of native coronary artery without angina pectoris: Secondary | ICD-10-CM | POA: Diagnosis present

## 2010-11-27 DIAGNOSIS — I5032 Chronic diastolic (congestive) heart failure: Secondary | ICD-10-CM | POA: Diagnosis present

## 2010-11-27 DIAGNOSIS — I509 Heart failure, unspecified: Secondary | ICD-10-CM | POA: Diagnosis present

## 2010-11-27 LAB — TROPONIN I: Troponin I: <0.3 ng/mL (ref <0.30)

## 2010-11-27 LAB — CK TOTAL AND CKMB (NOT AT ARMC): Total CK: 38 U/L (ref 7–177)

## 2010-11-27 LAB — CBC
MCH: 30.8 pg (ref 26.0–34.0)
MCHC: 33 g/dL (ref 30.0–36.0)
Platelets: 182 10*3/uL (ref 150–400)
RBC: 4.15 MIL/uL (ref 3.87–5.11)
RDW: 14.6 % (ref 11.5–15.5)

## 2010-11-27 LAB — POCT I-STAT, CHEM 8
Creatinine, Ser: 1.3 mg/dL — ABNORMAL HIGH (ref 0.4–1.2)
Hemoglobin: 13.9 g/dL (ref 12.0–15.0)
Sodium: 136 mEq/L (ref 135–145)
TCO2: 25 mmol/L (ref 0–100)

## 2010-11-27 LAB — URINE MICROSCOPIC-ADD ON

## 2010-11-27 LAB — MAGNESIUM: Magnesium: 2.2 mg/dL (ref 1.5–2.5)

## 2010-11-27 LAB — URINALYSIS, ROUTINE W REFLEX MICROSCOPIC
Glucose, UA: 100 mg/dL — AB
Ketones, ur: 15 mg/dL — AB
pH: 5.5 (ref 5.0–8.0)

## 2010-11-28 LAB — CBC
MCH: 30.6 pg (ref 26.0–34.0)
MCHC: 32.9 g/dL (ref 30.0–36.0)
MCV: 93.2 fL (ref 78.0–100.0)
Platelets: 180 10*3/uL (ref 150–400)
RBC: 3.85 MIL/uL — ABNORMAL LOW (ref 3.87–5.11)
RDW: 14.5 % (ref 11.5–15.5)

## 2010-11-28 LAB — COMPREHENSIVE METABOLIC PANEL
ALT: 10 U/L (ref 0–35)
AST: 9 U/L (ref 0–37)
Albumin: 2.4 g/dL — ABNORMAL LOW (ref 3.5–5.2)
CO2: 27 mEq/L (ref 19–32)
Calcium: 9.4 mg/dL (ref 8.4–10.5)
Creatinine, Ser: 1.11 mg/dL (ref 0.4–1.2)
GFR calc non Af Amer: 47 mL/min — ABNORMAL LOW (ref >60)
Sodium: 135 mEq/L (ref 135–145)
Total Protein: 6.6 g/dL (ref 6.0–8.3)

## 2010-11-28 LAB — GLUCOSE, CAPILLARY: Glucose-Capillary: 227 mg/dL — ABNORMAL HIGH (ref 70–99)

## 2010-11-28 LAB — TSH: TSH: 0.884 u[IU]/mL (ref 0.350–4.500)

## 2010-11-28 LAB — DIFFERENTIAL
Basophils Relative: 0 % (ref 0–1)
Eosinophils Absolute: 0.2 10*3/uL (ref 0.0–0.7)
Eosinophils Relative: 1 % (ref 0–5)
Lymphs Abs: 1.2 10*3/uL (ref 0.7–4.0)
Monocytes Relative: 10 % (ref 3–12)
Neutrophils Relative %: 80 % — ABNORMAL HIGH (ref 43–77)

## 2010-11-29 LAB — URINE CULTURE: Colony Count: >=100000

## 2010-12-04 LAB — CULTURE, BLOOD (ROUTINE X 2)
Culture  Setup Time: 201206120141
Culture: NO GROWTH

## 2010-12-07 NOTE — H&P (Signed)
NAMEJAMINE, [name] NO.:  [id number]  MEDICAL RECORD NO.:  [id number]  LOCATION:  1303                         FACILITY:  [facility]  PHYSICIAN:  Marinda Elk, M.D.DATE OF BIRTH:  1921-12-06  DATE OF ADMISSION:  11/27/2010 DATE OF DISCHARGE:                             HISTORY & PHYSICAL   PRIMARY CARE DOCTOR:  Dr. Altamese Cabal.  This is an 77 year old female with past medical history of diabetes type 2, chronic diastolic heart failure with an EF of 50% and also coronary artery disease with history of an MI that comes in for falls.  The patient relates that about 2 days prior to admission, she started having falls.  They were not that frequent.  She did not seek medical attention at that time but they have been progressively getting worse.  She also relates increased generalized weakness and has not been able to eat and she has no appetite.  She relates she has been drinking but not quite as before.  She was going to her bed today and she felt too tired to get up.  So she pressed life alert and was found by EMS on the floor.  Per the patient's family, the patient has not been eating appropriately and the patient has a nurse that comes 3 times a week.  The patient denies any chest pain, shortness of breath, nausea or vomiting, burning when she urinates, diarrhea, or any focal weaknesses.  PAST MEDICAL HISTORY: 1. Vertigo. 2. Diabetes type 2 with an unknown hemoglobin A1c. 3. Chronic diastolic heart failure with an EF of 50%. 4. GERD. 5. Depression. 6. Coronary artery disease with history of an MI. 7. History of cholecystectomy. 8. Status post left nephrectomy.  ALLERGIES:  SULFA.  MEDICATIONS:  Pending at the time of dictation.  SOCIAL HISTORY:  She is a widow, lives alone, had a nurse that comes 3 times a week.  She has 7 children.  Some of them live in [place]. She denies alcohol or drugs.  FAMILY HISTORY:  Noncontributory.  REVIEW OF  SYSTEMS:  A 10-point review of system done, pertinent positives per HPI.  PHYSICAL EXAMINATION:  VITAL SIGNS:  Temperature 98, heart rate of 115, blood pressure 110/70, O2 sat 98% on room air. GENERAL:  She is awake, alert, and oriented x2 in no acute distress. HEENT:  Dry mucous membranes.  Atraumatic, normocephalic head. NECK:  No JVD, no bruits, no thyromegaly. CARDIOVASCULAR:  She has a regular rate and rhythm with positive S1, S2. No murmurs, rubs, or gallops. ABDOMEN:  Positive bowel sounds, nontender, nondistended. EXTREMITIES:  Positive pulses.  No clubbing, cyanosis, or edema. NEUROLOGIC:  She is awake, alert, and oriented x2, coherent and fluent language.  III-XII are grossly intact.  Sensation is intact throughout. Muscle strength is abnormal.  Finger-to-nose is normal and no focal weaknesses. SKIN:  She has no ulcerations or rash and lymphadenopathy nonpalpable.  Labs on admission showed a UA with too numerous to count white blood cells and bacteria.  First set of cardiac enzymes are negative x1.  Her white count is 13, hemoglobin of 12.8, platelet count 182,000.  Her sodium was 136, potassium 4.3, chloride  102, glucose of 226, BUN of 29, creatinine 1.3, and bicarb of 25.  Imaging shows pelvic x-ray, no evidence of fracture or dislocation.  L spine x-ray showed no evidence of fracture or dislocation.  CT head showed no acute intracranial abnormality, global small vessel ischemic changes.  C-spine CT showed no evidence of fracture or dislocation.  A chest x-ray showed no acute cardiopulmonary disease.  EKG is pending at the time of dictation.  ASSESSMENT/PLAN: 1. Urinary tract infection, most likely pyelo.  We will start her on     Rocephin and get blood cultures and urine culture.  We will start     on IV fluids gently as she does have chronic diastolic heart     failure.  We will monitor her vitals closely.  We will also check     PT, OT, and we will assess for  nursing home placement.  The patient     relates that she has been over the last few years progressively     slowing down and would like to be placed. 2. Uncontrolled diabetes mellitus, unknown hemoglobin A1c.  We will     check.  We will continue home medicines and add sliding scale.  We     will give her a low sodium diet. 3. Chronic diastolic heart failure, currently stable.  We will     continue her blood pressure medications and her Lasix.  We will     monitor Is and Os closely.     Marinda Elk, M.D.     AF/MEDQ  D:  11/27/2010  T:  11/27/2010  Job:  [id number]  cc:   Altamese Cabal  Electronically Signed by Marinda Elk M.D. on 12/07/2010 06:20:12 PM

## 2010-12-07 NOTE — Discharge Summary (Signed)
NAMECHANTERIA, [name] NO.:  [id number]  MEDICAL RECORD NO.:  [id number]  LOCATION:                                 FACILITY:  PHYSICIAN:  Marinda Elk, M.D.DATE OF BIRTH:  June 28, 1921  DATE OF ADMISSION: DATE OF DISCHARGE:                              DISCHARGE SUMMARY   PRIMARY CARE DOCTOR:  [name].  PACE program coordinator, Dr. Dorothe Pea.  DISCHARGE DIAGNOSES: 1. Urinary tract infection. 2. Uncontrolled diabetes mellitus. 3. Chronic diastolic heart failure. 4. Depression.  DISCHARGE MEDICATIONS: 1. Cipro 500 mg p.o. b.i.d. 2. Amaryl 4 mg daily. 3. Aspirin 81 mg daily. 4. Folate 100 mg daily. 5. Isosorbide mononitrate XR 60 mg by mouth every morning. 6. Metformin 1000 mg daily. 7. Metoprolol 25 mg daily. 8. MiraLAX 17 g daily. 9. Nitroglycerin 0.4 mg t.i.d. 10.Pepcid 20 mg daily. 11.Protonix 40 mg daily. 12.Sertraline 100 mg daily. 13.Tums 500 mg daily. 14.Vitamin B12 monthly. 15.Vitamin D3 1000 units daily.  PROCEDURES PERFORMED:  Chest x-ray showed no acute cardiopulmonary disease.  CT Head:  No acute intracranial findings.  C-spine:  No evidence of fracture or dislocation.  X-ray of the spine showed no evidence of fracture.  CONSULTS:  None.  BRIEF ADMITTING HISTORY AND PHYSICAL:  This is an 77 year old female with past medical history of type 2 diabetes, diastolic heart failure, also coronary artery disease that comes in for falls.  The patient relates 2 days prior to admission, she started having falls and they were not that frequent.  She did not seek any medical attention, but did progressively get worse.  She also relates an increase in generalized weakness and has not been able to eat or drink as before.  She relates that today, she was going to the bed and she fell to the floor and was too tired to get up, so she pressed her life alert and the EMS came to see her, so we were asked to admit and further  evaluate.  Please refer to dictation from November 17, 2010, for further details.  Labs on admission shows UA too numerous to count with many bacteria.  Her cardiac enzymes were negative x1.  Her white count was 13, hemoglobin of 12, platelet count 92, sodium 136, potassium 4.3, chloride 102, glucose of 226, BUN of 29, creatinine of 1.3, bicarb of 25, glucose imaging as above.  ASSESSMENT AND PLAN: 1. Urinary tract infection.  She was started on Rocephin.  She by the     next day, her balance with a lot better.  She remained afebrile.     Her white count is going down.  I spoke to Dr. Dorothe Pea, the  PACE     program director, which we talked personally and believed that the     patient to go back to home with further assistance.  Over at home,     she will get PT to continue to see her.  The PACE program will take     over her care.  At this time, she has remained afebrile.  I have     given her 2 doses of Rocephin.  I have also changed  her to Cipro.     Urine cultures will be sent to Dr. Dorothe Pea .  Will use Bactrim  if     needed. 2. Uncontrolled diabetes mellitus probably secondary to her urinary     tract infection.  She was started on NovoLog.  Her blood sugar has     been much improved.  The last glucose was 133. 3. Chronic diastolic heart failure , currently  compensated.  No     changes were made.  Vitals on day of discharge shows temperature of     98, pulse 94, respirations of 17, blood pressure 114/61.  She was     satting 95% on room air.  Labs on day of discharge shows sodium 136, potassium 3.6, chloride 100, bicarb 27, glucose 135, BUN 23, creatinine 0.1, albumin of 2.4.  Her white count is 13.1 with an ANC of 10.5, hemoglobin of 11.8 with platelet count of 180.     Marinda Elk, M.D.     AF/MEDQ  D:  11/28/2010  T:  11/29/2010  Job:  [id number]  cc:   Jethro Bastos, M.D. Fax: [phone]  Electronically Signed by Marinda Elk M.D. on 12/07/2010 11:91:47  PM

## 2011-06-19 ENCOUNTER — Emergency Department (HOSPITAL_COMMUNITY)
Admission: EM | Admit: 2011-06-19 | Discharge: 2011-06-20 | Disposition: A | Discharge status: Home / Self Care | Payer: Medicare Other | Attending: Emergency Medicine | Admitting: Emergency Medicine

## 2011-06-19 ENCOUNTER — Encounter: Payer: Self-pay | Admitting: Emergency Medicine

## 2011-06-19 DIAGNOSIS — R3989 Other symptoms and signs involving the genitourinary system: Secondary | ICD-10-CM | POA: Insufficient documentation

## 2011-06-19 DIAGNOSIS — R42 Dizziness and giddiness: Secondary | ICD-10-CM | POA: Insufficient documentation

## 2011-06-19 HISTORY — DX: Dizziness and giddiness: R42

## 2011-06-19 MED ORDER — MECLIZINE HCL 25 MG PO TABS
25.0000 mg | ORAL_TABLET | Freq: Once | ORAL | Status: AC
  Administered 2011-06-19: 25 mg via ORAL
  Filled 2011-06-19: qty 1

## 2011-06-19 NOTE — ED Notes (Signed)
MD at bedside. 

## 2011-06-19 NOTE — ED Notes (Signed)
WUX:[id number]<WN> Expected date:06/19/11<BR> Expected time:10:38 PM<BR> Means of arrival:Ambulance<BR> Comments:<BR> M50 - 89yoF Dizzy

## 2011-06-19 NOTE — ED Notes (Signed)
Pt c/o dizziness. Has history of vertigo. States she "feels drunk". Symptoms since waking this AM. Pt has Antivert at home, but the med has expired and pt didn't want to take it. Pt also states "it doesn't feel right" when she urinates. Pt a/o x 4 per baseline per EMS. Pt normally ambulates at home with walker.

## 2011-06-19 NOTE — ED Provider Notes (Signed)
History     CSN: [id number]  Arrival date & time 06/19/11  2253   First MD Initiated Contact with Patient 06/19/11 2324      Chief Complaint  Patient presents with  . Dizziness    (Consider location/radiation/quality/duration/timing/severity/associated sxs/prior treatment) The history is provided by the patient.  pt with hx vertigo, presents stating in past day dizziness/room spinning sensation. Worse w rapid head movements. States hx same symptoms intermittently in past but that her antivert rx was outdated so was afraid to take. No ringing in ears or hearing loss. No headache. No fall/injury. No change in vision or speech. No numbness/weakness. Also notes urine cloudy. No abd or flank pain. No fever or chills. Hx utis.   Past Medical History  Diagnosis Date  . Vertigo     No past surgical history on file.  No family history on file.  History  Substance Use Topics  . Smoking status: Not on file  . Smokeless tobacco: Not on file  . Alcohol Use:     OB History    Grav Para Term Preterm Abortions TAB SAB Ect Mult Living                  Review of Systems  Constitutional: Negative for fever and chills.  HENT: Negative for neck pain.   Eyes: Negative for redness and visual disturbance.  Respiratory: Negative for shortness of breath.   Cardiovascular: Negative for chest pain and palpitations.  Gastrointestinal: Negative for abdominal pain.  Genitourinary: Negative for flank pain.  Musculoskeletal: Negative for back pain.  Skin: Negative for rash.  Neurological: Negative for weakness, light-headedness, numbness and headaches.  Hematological: Does not bruise/bleed easily.  Psychiatric/Behavioral: Negative for confusion.    Allergies  Advair hfa  Home Medications   Current Outpatient Rx  Name Route Sig Dispense Refill  . ACETAMINOPHEN 325 MG PO TABS Oral Take 650 mg by mouth every 6 (six) hours as needed. Pain     . ASPIRIN 81 MG PO TBEC Oral Take 81 mg by mouth  daily.      . ATORVASTATIN CALCIUM 20 MG PO TABS Oral Take 20 mg by mouth daily.      Marland Kitchen PRODIGY VOICE BLOOD GLUCOSE W/DEVICE KIT Does not apply by Does not apply route.      Marland Kitchen CETIRIZINE HCL 10 MG PO TABS Oral Take 10 mg by mouth daily.      Marland Kitchen VITAMIN D 1000 UNITS PO TABS Oral Take 1,000 Units by mouth daily.      Marland Kitchen VITAMIN B-12 IJ Injection Inject 1,000 mg as directed every 30 (thirty) days.      Marland Kitchen GLIMEPIRIDE 2 MG PO TABS Oral Take 2 mg by mouth daily before breakfast.      . GLUCOSE BLOOD VI STRP Other 1 each by Other route daily. Use once daily to check blood sugars. Disp 100     . ISOSORBIDE MONONITRATE ER 60 MG PO TB24 Oral Take 60 mg by mouth daily.     Marland Kitchen METFORMIN HCL 1000 MG PO TABS Oral Take 1,000 mg by mouth daily with breakfast.     . METOPROLOL SUCCINATE ER 25 MG PO TB24 Oral Take 25 mg by mouth daily.      Marland Kitchen PANTOPRAZOLE SODIUM 40 MG PO TBEC Oral Take 40 mg by mouth daily.      Marland Kitchen POLYETHYLENE GLYCOL 3350 PO PACK Oral Take 17 g by mouth 2 (two) times a week.      Marland Kitchen  SERTRALINE HCL 100 MG PO TABS Oral Take 100 mg by mouth daily.      Marland Kitchen SITAGLIPTIN PHOSPHATE 50 MG PO TABS Oral Take 50 mg by mouth daily.      Marland Kitchen NITROGLYCERIN 0.4 MG SL SUBL Sublingual Place 0.4 mg under the tongue as directed.        BP 140/57  Temp(Src) 98.3 F (36.8 C) (Oral)  Resp 16  SpO2 93%  Physical Exam  Nursing note and vitals reviewed. Constitutional: She is oriented to person, place, and time. She appears well-developed and well-nourished. No distress.  HENT:  Head: Atraumatic.  Right Ear: External ear normal.  Left Ear: External ear normal.  Eyes: Conjunctivae are normal. Pupils are equal, round, and reactive to light. No scleral icterus.  Neck: Neck supple. No tracheal deviation present.       No bruit  Cardiovascular: Normal rate, regular rhythm, normal heart sounds and intact distal pulses.  Exam reveals no gallop and no friction rub.   No murmur heard. Pulmonary/Chest: Effort normal and  breath sounds normal. No respiratory distress.  Abdominal: Soft. Normal appearance and bowel sounds are normal. She exhibits no distension. There is no tenderness. There is no rebound and no guarding.  Musculoskeletal: She exhibits no edema and no tenderness.  Neurological: She is alert and oriented to person, place, and time.       No facial droop. No pronator drift. Motor intact bil. Trans unidir nystagmus. Ambulates w walker.   Skin: Skin is warm and dry. No rash noted.  Psychiatric: She has a normal mood and affect.    ED Course  Procedures (including critical care time)   Labs Reviewed  URINALYSIS, ROUTINE W REFLEX MICROSCOPIC   Results for orders placed during the hospital encounter of 06/19/11  URINALYSIS, ROUTINE W REFLEX MICROSCOPIC      Component Value Range   Color, Urine YELLOW  YELLOW    APPearance CLEAR  CLEAR    Specific Gravity, Urine 1.019  1.005 - 1.030    pH 6.0  5.0 - 8.0    Glucose, UA NEGATIVE  NEGATIVE (mg/dL)   Hgb urine dipstick NEGATIVE  NEGATIVE    Bilirubin Urine NEGATIVE  NEGATIVE    Ketones, ur NEGATIVE  NEGATIVE (mg/dL)   Protein, ur NEGATIVE  NEGATIVE (mg/dL)   Urobilinogen, UA 0.2  0.0 - 1.0 (mg/dL)   Nitrite NEGATIVE  NEGATIVE    Leukocytes, UA NEGATIVE  NEGATIVE         MDM  Reviewed prior charts - he prior eva/admit for vertigo. antivert po. ua sent.   Recheck feels improved. Ambulate in hall.       Suzi Roots, MD 06/20/11 [phone]

## 2011-06-20 LAB — URINALYSIS, ROUTINE W REFLEX MICROSCOPIC
Bilirubin Urine: NEGATIVE
Hgb urine dipstick: NEGATIVE
Nitrite: NEGATIVE
Protein, ur: NEGATIVE mg/dL
Urobilinogen, UA: 0.2 mg/dL (ref 0.0–1.0)

## 2011-06-20 MED ORDER — MECLIZINE HCL 25 MG PO TABS: 25.0000 mg | ORAL_TABLET | Freq: Three times a day (TID) | ORAL | Status: AC | PRN

## 2011-06-20 NOTE — ED Notes (Addendum)
Ambulated 50 ft Pt. States she feels a little dizzy when ambulating

## 2011-06-20 NOTE — ED Notes (Signed)
[facility] here to get  Pt.

## 2011-06-20 NOTE — ED Notes (Signed)
Pt states "I feel like a new person!" States her vertigo is gone. Pt cheerful, talkative.

## 2011-06-20 NOTE — ED Notes (Signed)
MD at bedside. 

## 2011-06-20 NOTE — ED Notes (Signed)
[facility] called to come pick up pt.  

## 2012-09-28 ENCOUNTER — Emergency Department (HOSPITAL_COMMUNITY): Payer: Medicare (Managed Care)

## 2012-09-28 ENCOUNTER — Encounter (HOSPITAL_COMMUNITY): Payer: Self-pay | Admitting: *Deleted

## 2012-09-28 ENCOUNTER — Emergency Department (HOSPITAL_COMMUNITY)
Admission: EM | Admit: 2012-09-28 | Discharge: 2012-09-28 | Disposition: A | Discharge status: Home / Self Care | Payer: Medicare (Managed Care) | Attending: Emergency Medicine | Admitting: Emergency Medicine

## 2012-09-28 DIAGNOSIS — M436 Torticollis: Secondary | ICD-10-CM | POA: Insufficient documentation

## 2012-09-28 DIAGNOSIS — Z8719 Personal history of other diseases of the digestive system: Secondary | ICD-10-CM | POA: Insufficient documentation

## 2012-09-28 DIAGNOSIS — IMO0001 Reserved for inherently not codable concepts without codable children: Secondary | ICD-10-CM | POA: Insufficient documentation

## 2012-09-28 DIAGNOSIS — Z8673 Personal history of transient ischemic attack (TIA), and cerebral infarction without residual deficits: Secondary | ICD-10-CM | POA: Insufficient documentation

## 2012-09-28 DIAGNOSIS — I252 Old myocardial infarction: Secondary | ICD-10-CM | POA: Insufficient documentation

## 2012-09-28 DIAGNOSIS — Z7982 Long term (current) use of aspirin: Secondary | ICD-10-CM | POA: Insufficient documentation

## 2012-09-28 DIAGNOSIS — K5900 Constipation, unspecified: Secondary | ICD-10-CM | POA: Insufficient documentation

## 2012-09-28 DIAGNOSIS — Z9889 Other specified postprocedural states: Secondary | ICD-10-CM | POA: Insufficient documentation

## 2012-09-28 DIAGNOSIS — E119 Type 2 diabetes mellitus without complications: Secondary | ICD-10-CM | POA: Insufficient documentation

## 2012-09-28 DIAGNOSIS — Z87891 Personal history of nicotine dependence: Secondary | ICD-10-CM | POA: Insufficient documentation

## 2012-09-28 DIAGNOSIS — Z9089 Acquired absence of other organs: Secondary | ICD-10-CM | POA: Insufficient documentation

## 2012-09-28 DIAGNOSIS — R42 Dizziness and giddiness: Secondary | ICD-10-CM | POA: Insufficient documentation

## 2012-09-28 DIAGNOSIS — Z79899 Other long term (current) drug therapy: Secondary | ICD-10-CM | POA: Insufficient documentation

## 2012-09-28 HISTORY — DX: Cerebral infarction, unspecified: I63.9

## 2012-09-28 HISTORY — DX: Unspecified abdominal hernia without obstruction or gangrene: K46.9

## 2012-09-28 LAB — POCT I-STAT, CHEM 8
Chloride: 102 mEq/L (ref 96–112)
HCT: 43 % (ref 36.0–46.0)
Hemoglobin: 14.6 g/dL (ref 12.0–15.0)
Potassium: 4.8 mEq/L (ref 3.5–5.1)
Sodium: 140 mEq/L (ref 135–145)

## 2012-09-28 LAB — CBC
HCT: 41.1 % (ref 36.0–46.0)
MCH: 30.7 pg (ref 26.0–34.0)
MCHC: 32.6 g/dL (ref 30.0–36.0)
MCV: 94.3 fL (ref 78.0–100.0)
Platelets: 143 10*3/uL — ABNORMAL LOW (ref 150–400)
RDW: 15.1 % (ref 11.5–15.5)

## 2012-09-28 LAB — URINALYSIS, ROUTINE W REFLEX MICROSCOPIC
Glucose, UA: 100 mg/dL — AB
Ketones, ur: NEGATIVE mg/dL
Protein, ur: NEGATIVE mg/dL
Urobilinogen, UA: 0.2 mg/dL (ref 0.0–1.0)

## 2012-09-28 LAB — URINE MICROSCOPIC-ADD ON

## 2012-09-28 MED ORDER — POLYETHYLENE GLYCOL 3350 17 G PO PACK: 17.0000 g | PACK | Freq: Every day | ORAL | Status: DC

## 2012-09-28 MED ORDER — DOCUSATE SODIUM 100 MG PO CAPS: 100.0000 mg | ORAL_CAPSULE | Freq: Two times a day (BID) | ORAL | Status: DC

## 2012-09-28 MED ORDER — MILK AND MOLASSES ENEMA
Freq: Once | RECTAL | Status: AC
  Administered 2012-09-28: 08:00:00 via RECTAL
  Filled 2012-09-28: qty 250

## 2012-09-28 NOTE — ED Notes (Signed)
Pt's daughter: Harriett Sine: [phone]

## 2012-09-28 NOTE — ED Provider Notes (Signed)
Medical screening examination/treatment/procedure(s) were performed by non-physician practitioner and as supervising physician I was immediately available for consultation/collaboration.  John-Adam Ajee Heasley, M.D.     John-Adam Ormand Senn, MD 09/28/12 0849 

## 2012-09-28 NOTE — ED Provider Notes (Signed)
History     CSN: [id number]  Arrival date & time 09/28/12  5784   First MD Initiated Contact with Patient 09/28/12 0606      No chief complaint on file.   (Consider location/radiation/quality/duration/timing/severity/associated sxs/prior treatment) HPI Comments: Patient is an 79 year old female who presents today for one week of constipation. She's having left sided neck, shoulder and arm pain. She denies any recent falls. She states she began to get constipated after eating 3 cups of cottage cheese.She denies being able to pass gas. She gets constipated frequently. She states her abdomen is tender, but no more so than normal. She has had multiple abdominal surgeries in the past. No shortness of breath, chest pain, fevers, nausea, vomiting, weakness, numbness.  Patient is a 79 y.o. female presenting with constipation. The history is provided by the patient. No language interpreter was used.  Constipation  The current episode started 5 to 7 days ago. The onset was sudden. The problem occurs frequently. The problem has been unchanged. The pain is mild. Pertinent negatives include no anorexia, no fever, no diarrhea, no nausea, no vomiting, no chest pain, no headaches and no difficulty breathing. She has been behaving normally. Her past medical history is significant for abdominal surgery. Her past medical history does not include recent abdominal injury or recent antibiotic use. There were no sick contacts.    Past Medical History  Diagnosis Date  . Vertigo   . Diabetes mellitus without complication   . Myocardial infarction   . Stroke   . Hernia     Past Surgical History  Procedure Laterality Date  . Cholecystectomy    . Abdominal surgery      No family history on file.  History  Substance Use Topics  . Smoking status: Former Games developer  . Smokeless tobacco: Not on file  . Alcohol Use: No    OB History   Grav Para Term Preterm Abortions TAB SAB Ect Mult Living                   Review of Systems  Constitutional: Negative for fever.  Cardiovascular: Negative for chest pain.  Gastrointestinal: Positive for constipation. Negative for nausea, vomiting, diarrhea and anorexia.  Neurological: Negative for headaches.  All other systems reviewed and are negative.    Allergies  Fluticasone-salmeterol  Home Medications   Current Outpatient Rx  Name  Route  Sig  Dispense  Refill  . acetaminophen (TYLENOL) 325 MG tablet   Oral   Take 650 mg by mouth every 6 (six) hours as needed. Pain          . aspirin 81 MG EC tablet   Oral   Take 81 mg by mouth daily.           Marland Kitchen atorvastatin (LIPITOR) 20 MG tablet   Oral   Take 20 mg by mouth daily.           . Blood Glucose Monitoring Suppl (PRODIGY VOICE BLOOD GLUCOSE) W/DEVICE KIT   Does not apply   by Does not apply route.           . cetirizine (ZYRTEC) 10 MG tablet   Oral   Take 10 mg by mouth daily.           . cholecalciferol (VITAMIN D) 1000 UNITS tablet   Oral   Take 1,000 Units by mouth daily.           . Cyanocobalamin (VITAMIN B-12 IJ)   Injection  Inject 1,000 mg as directed every 30 (thirty) days.           Marland Kitchen glimepiride (AMARYL) 2 MG tablet   Oral   Take 2 mg by mouth daily before breakfast.           . glucose blood (PRODIGY NO CODING BLOOD GLUC) test strip   Other   1 each by Other route daily. Use once daily to check blood sugars. Disp 100          . isosorbide mononitrate (IMDUR) 60 MG 24 hr tablet   Oral   Take 60 mg by mouth daily.          . metFORMIN (GLUCOPHAGE) 1000 MG tablet   Oral   Take 1,000 mg by mouth daily with breakfast.          . metoprolol succinate (TOPROL-XL) 25 MG 24 hr tablet   Oral   Take 25 mg by mouth daily.           . nitroGLYCERIN (NITROSTAT) 0.4 MG SL tablet   Sublingual   Place 0.4 mg under the tongue as directed.           . pantoprazole (PROTONIX) 40 MG tablet   Oral   Take 40 mg by mouth daily.           .  polyethylene glycol (MIRALAX / GLYCOLAX) packet   Oral   Take 17 g by mouth 2 (two) times a week.           . sertraline (ZOLOFT) 100 MG tablet   Oral   Take 100 mg by mouth daily.           . sitaGLIPtan (JANUVIA) 50 MG tablet   Oral   Take 50 mg by mouth daily.             BP 115/82  Pulse 86  Temp(Src) 97.5 F (36.4 C)  Resp 18  Wt 153 lb (69.4 kg)  BMI 28.92 kg/m2  SpO2 94%  Physical Exam  Nursing note and vitals reviewed. Constitutional: She is oriented to person, place, and time. She appears well-developed and well-nourished. No distress.  HENT:  Head: Normocephalic and atraumatic.  Right Ear: External ear normal.  Left Ear: External ear normal.  Nose: Nose normal.  Mouth/Throat: Oropharynx is clear and moist.  Eyes: Conjunctivae are normal.  Neck: Normal range of motion.  Cardiovascular: Normal rate, regular rhythm and normal heart sounds.   Pulmonary/Chest: Effort normal and breath sounds normal. No stridor. No respiratory distress. She has no wheezes. She has no rales.  Abdominal: Soft. Normal appearance and bowel sounds are normal. She exhibits no distension. There is generalized tenderness.  Musculoskeletal: Normal range of motion.  ttp over left neck and shoulder  Neurological: She is alert and oriented to person, place, and time. She has normal strength.  Skin: Skin is warm and dry. She is not diaphoretic. No erythema.  Psychiatric: She has a normal mood and affect. Her behavior is normal.    ED Course  Procedures (including critical care time)  Labs Reviewed  URINALYSIS, ROUTINE W REFLEX MICROSCOPIC - Abnormal; Notable for the following:    APPearance CLOUDY (*)    Glucose, UA 100 (*)    Hgb urine dipstick TRACE (*)    Leukocytes, UA TRACE (*)    All other components within normal limits  CBC - Abnormal; Notable for the following:    WBC 11.5 (*)    Platelets 143 (*)  All other components within normal limits  URINE MICROSCOPIC-ADD ON -  Abnormal; Notable for the following:    Squamous Epithelial / LPF FEW (*)    Bacteria, UA MANY (*)    All other components within normal limits  POCT I-STAT, CHEM 8 - Abnormal; Notable for the following:    BUN 27 (*)    Glucose, Bld 207 (*)    All other components within normal limits  URINE CULTURE  TROPONIN I   No results found.   Date: 09/28/2012  Rate: 86  Rhythm: normal sinus rhythm  QRS Axis: normal  Intervals: normal  ST/T Wave abnormalities: normal  Conduction Disutrbances:none  Narrative Interpretation:   Old EKG Reviewed: unchanged   1. Constipation   2. Torticollis       MDM  Patient is an 79 year old female who presents with 1 week of constipation and recent falls. Heat CT negative for acute pathology. Cervical CT negative for fx. Acute abdominal series show moderate to large stool burden with no evidence of obstruction. EKG WNL. Bowel regimen given in ED. After milk and molasses enema was unsuccessful, soap and suds enema was able to provide patient with relief as she evacuated a large amount of stool per the nurse. She states her symptoms resolved. Her neck pain is consistent with torticollis. Dr. Rulon Abide evaluated this patient and agrees with plan. Return precautions given. VSS for discharge. Patient / Family / Caregiver informed of clinical course, understand medical decision-making process, and agree with plan.         Mora Bellman, PA-C 09/30/12 1840

## 2012-09-28 NOTE — ED Provider Notes (Signed)
MSE was initiated and I personally evaluated the patient and placed orders (if any) at  5:52 AM on September 28, 2012.  [name] states she has been constipated for approximately one week after eating.  3 containers of cottage cheese.  She also has generalized myalgias to to her arthritis, but has noticed, that in the last 6 hours or so, that she's had worsening.  Pain in her left shoulder, and her neck.  She denies any shortness of breath, nausea, or vomiting.  The patient appears stable so that the remainder of the MSE may be completed by another provider.  Arman Filter, NP 09/28/12 [phone]

## 2012-09-28 NOTE — ED Notes (Signed)
[id number]<JY> Expected date:<BR> Expected time:<BR> Means of arrival:<BR> Comments:<BR> EMS/79 yo female with diffuse chronic pain-arms/back and neck

## 2012-09-28 NOTE — ED Notes (Signed)
Per EMS report: Pt from home: Pt c/o of diffuse body pain that is non traumatic and chronic with a concentration in her arms, shoulders, neck, and pain.  Pt denies falling. Pain began around 21:00 this evening.  Pt has not taken any medication for relief. Pt also reports of constipation x 1 week.  EMS VS: BP: 142/p, HR: 92, RR: 18, 94%RA, cbg: 199

## 2012-09-28 NOTE — ED Notes (Signed)
Patient transported to CT 

## 2012-09-30 LAB — URINE CULTURE

## 2012-10-01 ENCOUNTER — Telehealth (HOSPITAL_COMMUNITY): Payer: Self-pay | Admitting: Emergency Medicine

## 2012-10-03 ENCOUNTER — Telehealth (HOSPITAL_COMMUNITY): Payer: Self-pay | Admitting: Emergency Medicine

## 2012-10-06 NOTE — ED Provider Notes (Signed)
Medical screening examination/treatment/procedure(s) were conducted as a shared visit with non-physician practitioner(s) and myself.  I personally evaluated the patient during the encounter Jones Skene, M.D.  [name] is a 79 y.o. female presenting with left sided neck and trapezius pain.  Denies antecedant trauma. There is no midline tenderness, there is no fever, no recent illness.  Pain is moderate, non-radiating and she hasn't taken anything for it.  She also c/o abdominal pain which she thinks is secondary to chronic constipation.  It is diffuse, crampy, intermittent and no worse than her usual abdominal pain.  Denies bloody stools, black stools, nausea vomiting or diarrhea.   At least 10pt or greater review of systems completed and are negative except where specified in the HPI.  VITAL SIGNS:   Filed Vitals:   09/28/12 1225  BP: 121/78  Pulse: 86  Temp: 98.2 F (36.8 C)  Resp: 18   CONSTITUTIONAL: Awake, oriented, appears non-toxic HENT: Atraumatic, normocephalic, oral mucosa pink and moist, airway patent. Nares patent without drainage. External ears normal. EYES: Conjunctiva clear, EOMI, PERRLA NECK: Trachea midline, supple, mild tenderness noted in the the paracervical spinal muscles on the left as well as tenderness in the left trapezius muscle belly  CARDIOVASCULAR: Normal heart rate, Normal rhythm, No murmurs, rubs, gallops PULMONARY/CHEST: Clear to auscultation, no rhonchi, wheezes, or rales. Symmetrical breath sounds. Non-tender. ABDOMINAL: Non-distended, soft, mildly -diffuse-tender - no rebound or guarding. She says this is "normal" for her.  BS normal. NEUROLOGIC: Non-focal, moving all four extremities, no gross sensory or motor deficits. EXTREMITIES: No clubbing, cyanosis, or edema SKIN: Warm, Dry, No erythema, No rash  Labs Reviewed  URINALYSIS, ROUTINE W REFLEX MICROSCOPIC - Abnormal; Notable for the following:    APPearance CLOUDY (*)    Glucose, UA 100  (*)    Hgb urine dipstick TRACE (*)    Leukocytes, UA TRACE (*)    All other components within normal limits  CBC - Abnormal; Notable for the following:    WBC 11.5 (*)    Platelets 143 (*)    All other components within normal limits  URINE MICROSCOPIC-ADD ON - Abnormal; Notable for the following:    Squamous Epithelial / LPF FEW (*)    Bacteria, UA MANY (*)    All other components within normal limits  POCT I-STAT, CHEM 8 - Abnormal; Notable for the following:    BUN 27 (*)    Glucose, Bld 207 (*)    All other components within normal limits  URINE CULTURE  TROPONIN I    MDM: PT arrives w/ concern for abdominal pain that she thinks is 2/2 to constipation, given her age, we worked up her abdominal pain to excluded life threatening emergencies that could mimic constipation including obstruction - though the patient looks clinically well.  Workup negative - pt given multiple enemaes without electrolytes and had good result.  NEck pain seems muscular and c/w torticollis/muscle spasm of the neck.  Doubt meningitis, dissected carotid, - no HA and not c/w SAH.  Pt treated symptomatically and feels much better.     Jones Skene, MD 10/06/12 [phone]

## 2012-10-10 ENCOUNTER — Telehealth (HOSPITAL_COMMUNITY): Payer: Self-pay | Admitting: Emergency Medicine

## 2013-02-09 ENCOUNTER — Emergency Department (HOSPITAL_COMMUNITY): Payer: Medicare (Managed Care)

## 2013-02-09 ENCOUNTER — Encounter (HOSPITAL_COMMUNITY): Payer: Self-pay

## 2013-02-09 ENCOUNTER — Inpatient Hospital Stay (HOSPITAL_COMMUNITY)
Admission: EM | Admit: 2013-02-09 | Discharge: 2013-02-12 | DRG: 074 | Disposition: A | Discharge status: Skilled Nursing Facility | Payer: Medicare (Managed Care) | Attending: Internal Medicine | Admitting: Internal Medicine

## 2013-02-09 DIAGNOSIS — Z8673 Personal history of transient ischemic attack (TIA), and cerebral infarction without residual deficits: Secondary | ICD-10-CM

## 2013-02-09 DIAGNOSIS — I739 Peripheral vascular disease, unspecified: Secondary | ICD-10-CM | POA: Diagnosis present

## 2013-02-09 DIAGNOSIS — I10 Essential (primary) hypertension: Secondary | ICD-10-CM | POA: Diagnosis present

## 2013-02-09 DIAGNOSIS — S0003XA Contusion of scalp, initial encounter: Secondary | ICD-10-CM | POA: Diagnosis present

## 2013-02-09 DIAGNOSIS — H9190 Unspecified hearing loss, unspecified ear: Secondary | ICD-10-CM | POA: Diagnosis present

## 2013-02-09 DIAGNOSIS — I252 Old myocardial infarction: Secondary | ICD-10-CM

## 2013-02-09 DIAGNOSIS — F339 Major depressive disorder, recurrent, unspecified: Secondary | ICD-10-CM | POA: Diagnosis present

## 2013-02-09 DIAGNOSIS — Z888 Allergy status to other drugs, medicaments and biological substances status: Secondary | ICD-10-CM

## 2013-02-09 DIAGNOSIS — E119 Type 2 diabetes mellitus without complications: Secondary | ICD-10-CM

## 2013-02-09 DIAGNOSIS — W19XXXA Unspecified fall, initial encounter: Secondary | ICD-10-CM

## 2013-02-09 DIAGNOSIS — T4275XA Adverse effect of unspecified antiepileptic and sedative-hypnotic drugs, initial encounter: Secondary | ICD-10-CM | POA: Diagnosis present

## 2013-02-09 DIAGNOSIS — R42 Dizziness and giddiness: Secondary | ICD-10-CM | POA: Diagnosis present

## 2013-02-09 DIAGNOSIS — N183 Chronic kidney disease, stage 3 unspecified: Secondary | ICD-10-CM | POA: Diagnosis present

## 2013-02-09 DIAGNOSIS — N390 Urinary tract infection, site not specified: Secondary | ICD-10-CM

## 2013-02-09 DIAGNOSIS — E1149 Type 2 diabetes mellitus with other diabetic neurological complication: Principal | ICD-10-CM | POA: Diagnosis present

## 2013-02-09 DIAGNOSIS — Y92009 Unspecified place in unspecified non-institutional (private) residence as the place of occurrence of the external cause: Secondary | ICD-10-CM

## 2013-02-09 DIAGNOSIS — I472 Ventricular tachycardia, unspecified: Secondary | ICD-10-CM | POA: Diagnosis present

## 2013-02-09 DIAGNOSIS — IMO0002 Reserved for concepts with insufficient information to code with codable children: Secondary | ICD-10-CM | POA: Diagnosis present

## 2013-02-09 DIAGNOSIS — H3530 Unspecified macular degeneration: Secondary | ICD-10-CM | POA: Diagnosis present

## 2013-02-09 DIAGNOSIS — K219 Gastro-esophageal reflux disease without esophagitis: Secondary | ICD-10-CM | POA: Diagnosis present

## 2013-02-09 DIAGNOSIS — Z602 Problems related to living alone: Secondary | ICD-10-CM

## 2013-02-09 DIAGNOSIS — Z9181 History of falling: Secondary | ICD-10-CM

## 2013-02-09 DIAGNOSIS — W010XXA Fall on same level from slipping, tripping and stumbling without subsequent striking against object, initial encounter: Secondary | ICD-10-CM | POA: Diagnosis present

## 2013-02-09 DIAGNOSIS — Z87891 Personal history of nicotine dependence: Secondary | ICD-10-CM

## 2013-02-09 DIAGNOSIS — I951 Orthostatic hypotension: Secondary | ICD-10-CM | POA: Diagnosis present

## 2013-02-09 DIAGNOSIS — H548 Legal blindness, as defined in USA: Secondary | ICD-10-CM | POA: Diagnosis present

## 2013-02-09 DIAGNOSIS — N179 Acute kidney failure, unspecified: Secondary | ICD-10-CM | POA: Diagnosis present

## 2013-02-09 DIAGNOSIS — Z9089 Acquired absence of other organs: Secondary | ICD-10-CM

## 2013-02-09 DIAGNOSIS — I4729 Other ventricular tachycardia: Secondary | ICD-10-CM | POA: Diagnosis present

## 2013-02-09 DIAGNOSIS — E860 Dehydration: Secondary | ICD-10-CM | POA: Diagnosis present

## 2013-02-09 DIAGNOSIS — A498 Other bacterial infections of unspecified site: Secondary | ICD-10-CM | POA: Diagnosis present

## 2013-02-09 DIAGNOSIS — I2510 Atherosclerotic heart disease of native coronary artery without angina pectoris: Secondary | ICD-10-CM | POA: Diagnosis present

## 2013-02-09 DIAGNOSIS — E1165 Type 2 diabetes mellitus with hyperglycemia: Secondary | ICD-10-CM | POA: Diagnosis present

## 2013-02-09 DIAGNOSIS — E7800 Pure hypercholesterolemia, unspecified: Secondary | ICD-10-CM | POA: Diagnosis present

## 2013-02-09 DIAGNOSIS — E1142 Type 2 diabetes mellitus with diabetic polyneuropathy: Secondary | ICD-10-CM | POA: Diagnosis present

## 2013-02-09 DIAGNOSIS — I5030 Unspecified diastolic (congestive) heart failure: Secondary | ICD-10-CM | POA: Diagnosis present

## 2013-02-09 DIAGNOSIS — I129 Hypertensive chronic kidney disease with stage 1 through stage 4 chronic kidney disease, or unspecified chronic kidney disease: Secondary | ICD-10-CM | POA: Diagnosis present

## 2013-02-09 HISTORY — DX: Unspecified transfusion reaction, initial encounter: T80.92XA

## 2013-02-09 HISTORY — DX: Unspecified malignant neoplasm of skin of nose: C44.301

## 2013-02-09 HISTORY — DX: Gastro-esophageal reflux disease without esophagitis: K21.9

## 2013-02-09 HISTORY — DX: Type 2 diabetes mellitus without complications: E11.9

## 2013-02-09 HISTORY — DX: Other seasonal allergic rhinitis: J30.2

## 2013-02-09 HISTORY — DX: Repeated falls: R29.6

## 2013-02-09 HISTORY — DX: Depression, unspecified: F32.A

## 2013-02-09 HISTORY — DX: Unspecified glaucoma: H40.9

## 2013-02-09 HISTORY — DX: Unspecified asthma, uncomplicated: J45.909

## 2013-02-09 HISTORY — DX: Unspecified osteoarthritis, unspecified site: M19.90

## 2013-02-09 HISTORY — DX: Major depressive disorder, single episode, unspecified: F32.9

## 2013-02-09 LAB — GLUCOSE, CAPILLARY: Glucose-Capillary: 205 mg/dL — ABNORMAL HIGH (ref 70–99)

## 2013-02-09 LAB — URINALYSIS, ROUTINE W REFLEX MICROSCOPIC
Nitrite: NEGATIVE
Specific Gravity, Urine: 1.015 (ref 1.005–1.030)
Urobilinogen, UA: 1 mg/dL (ref 0.0–1.0)

## 2013-02-09 LAB — CBC WITH DIFFERENTIAL/PLATELET
Eosinophils Absolute: 0.2 10*3/uL (ref 0.0–0.7)
Lymphocytes Relative: 15 % (ref 12–46)
Lymphs Abs: 1.3 10*3/uL (ref 0.7–4.0)
Neutro Abs: 6.3 10*3/uL (ref 1.7–7.7)
Neutrophils Relative %: 76 % (ref 43–77)
Platelets: 142 10*3/uL — ABNORMAL LOW (ref 150–400)
RBC: 4.17 MIL/uL (ref 3.87–5.11)
WBC: 8.4 10*3/uL (ref 4.0–10.5)

## 2013-02-09 LAB — BASIC METABOLIC PANEL
CO2: 33 mEq/L — ABNORMAL HIGH (ref 19–32)
Chloride: 99 mEq/L (ref 96–112)
GFR calc non Af Amer: 35 mL/min — ABNORMAL LOW (ref >90)
Glucose, Bld: 286 mg/dL — ABNORMAL HIGH (ref 70–99)
Potassium: 4.4 mEq/L (ref 3.5–5.1)
Sodium: 141 mEq/L (ref 135–145)

## 2013-02-09 LAB — PRO B NATRIURETIC PEPTIDE: Pro B Natriuretic peptide (BNP): 318.7 pg/mL (ref 0–450)

## 2013-02-09 LAB — URINE MICROSCOPIC-ADD ON

## 2013-02-09 LAB — TROPONIN I: Troponin I: <0.3 ng/mL (ref <0.30)

## 2013-02-09 MED ORDER — SODIUM CHLORIDE 0.9 % IV SOLN: 250.0000 mL | INTRAVENOUS | Status: DC | PRN

## 2013-02-09 MED ORDER — ACETAMINOPHEN 325 MG PO TABS
650.0000 mg | ORAL_TABLET | Freq: Four times a day (QID) | ORAL | Status: DC | PRN
  Administered 2013-02-10 – 2013-02-12 (×3): 650 mg via ORAL
  Filled 2013-02-09 (×3): qty 2

## 2013-02-09 MED ORDER — VITAMIN D (ERGOCALCIFEROL) 1.25 MG (50000 UNIT) PO CAPS: 50000.0000 [IU] | ORAL_CAPSULE | ORAL | Status: DC

## 2013-02-09 MED ORDER — SERTRALINE HCL 100 MG PO TABS
100.0000 mg | ORAL_TABLET | Freq: Every day | ORAL | Status: DC
  Administered 2013-02-10 – 2013-02-12 (×3): 100 mg via ORAL
  Filled 2013-02-09 (×3): qty 1

## 2013-02-09 MED ORDER — GUAIFENESIN 100 MG/5ML PO SYRP
100.0000 mg | ORAL_SOLUTION | ORAL | Status: DC | PRN
Start: 2013-02-09 — End: 2013-02-12
  Filled 2013-02-09: qty 5

## 2013-02-09 MED ORDER — ASPIRIN EC 81 MG PO TBEC
81.0000 mg | DELAYED_RELEASE_TABLET | Freq: Every day | ORAL | Status: DC
  Administered 2013-02-10 – 2013-02-12 (×3): 81 mg via ORAL
  Filled 2013-02-09 (×3): qty 1

## 2013-02-09 MED ORDER — SODIUM CHLORIDE 0.9 % IV SOLN
INTRAVENOUS | Status: DC
  Administered 2013-02-09 – 2013-02-10 (×4): via INTRAVENOUS
  Administered 2013-02-10: 100 mL/h via INTRAVENOUS

## 2013-02-09 MED ORDER — SODIUM CHLORIDE 0.9 % IJ SOLN
3.0000 mL | Freq: Two times a day (BID) | INTRAMUSCULAR | Status: DC
  Administered 2013-02-09 – 2013-02-11 (×4): 3 mL via INTRAVENOUS

## 2013-02-09 MED ORDER — PANTOPRAZOLE SODIUM 40 MG PO TBEC
40.0000 mg | DELAYED_RELEASE_TABLET | Freq: Every day | ORAL | Status: DC
  Administered 2013-02-10 – 2013-02-12 (×3): 40 mg via ORAL
  Filled 2013-02-09 (×3): qty 1

## 2013-02-09 MED ORDER — ERGOCALCIFEROL 1.25 MG (50000 UT) PO CAPS: 50000.0000 [IU] | ORAL_CAPSULE | ORAL | Status: DC

## 2013-02-09 MED ORDER — GUAIFENESIN 100 MG/5ML PO LIQD: 100.0000 mg | ORAL | Status: DC | PRN

## 2013-02-09 MED ORDER — HYDROCERIN EX CREA
TOPICAL_CREAM | Freq: Two times a day (BID) | CUTANEOUS | Status: DC
  Administered 2013-02-09 – 2013-02-12 (×6): via TOPICAL
  Filled 2013-02-09: qty 113

## 2013-02-09 MED ORDER — GABAPENTIN 300 MG PO CAPS
300.0000 mg | ORAL_CAPSULE | Freq: Every day | ORAL | Status: DC
  Filled 2013-02-09 (×2): qty 1

## 2013-02-09 MED ORDER — SODIUM CHLORIDE 0.9 % IJ SOLN: 3.0000 mL | INTRAMUSCULAR | Status: DC | PRN

## 2013-02-09 MED ORDER — HEPARIN SODIUM (PORCINE) 5000 UNIT/ML IJ SOLN
5000.0000 [IU] | Freq: Three times a day (TID) | INTRAMUSCULAR | Status: DC
  Administered 2013-02-09 – 2013-02-12 (×8): 5000 [IU] via SUBCUTANEOUS
  Filled 2013-02-09 (×11): qty 1

## 2013-02-09 MED ORDER — NITROGLYCERIN 0.4 MG SL SUBL: 0.4000 mg | SUBLINGUAL_TABLET | SUBLINGUAL | Status: DC | PRN

## 2013-02-09 MED ORDER — ISOSORBIDE MONONITRATE ER 60 MG PO TB24
60.0000 mg | ORAL_TABLET | Freq: Every day | ORAL | Status: DC
  Administered 2013-02-10 – 2013-02-12 (×3): 60 mg via ORAL
  Filled 2013-02-09 (×3): qty 1

## 2013-02-09 MED ORDER — SENNA 8.6 MG PO TABS
1.0000 | ORAL_TABLET | Freq: Every day | ORAL | Status: DC
  Administered 2013-02-09 – 2013-02-11 (×3): 8.6 mg via ORAL
  Filled 2013-02-09 (×4): qty 1

## 2013-02-09 MED ORDER — SODIUM CHLORIDE 0.9 % IJ SOLN: 3.0000 mL | Freq: Two times a day (BID) | INTRAMUSCULAR | Status: DC

## 2013-02-09 MED ORDER — EUCERIN EX CREA: 1.0000 "application " | TOPICAL_CREAM | Freq: Two times a day (BID) | CUTANEOUS | Status: DC

## 2013-02-09 MED ORDER — INSULIN ASPART 100 UNIT/ML ~~LOC~~ SOLN
0.0000 [IU] | Freq: Three times a day (TID) | SUBCUTANEOUS | Status: DC
  Administered 2013-02-10 (×2): 5 [IU] via SUBCUTANEOUS
  Administered 2013-02-11: 3 [IU] via SUBCUTANEOUS
  Administered 2013-02-11 – 2013-02-12 (×3): 2 [IU] via SUBCUTANEOUS

## 2013-02-09 MED ORDER — DOCUSATE SODIUM 100 MG PO CAPS
100.0000 mg | ORAL_CAPSULE | Freq: Every day | ORAL | Status: DC
  Administered 2013-02-10 – 2013-02-12 (×3): 100 mg via ORAL
  Filled 2013-02-09 (×3): qty 1

## 2013-02-09 MED ORDER — METOPROLOL TARTRATE 25 MG PO TABS
25.0000 mg | ORAL_TABLET | Freq: Every day | ORAL | Status: DC
Start: 2013-02-10 — End: 2013-02-12
  Administered 2013-02-10 – 2013-02-12 (×3): 25 mg via ORAL
  Filled 2013-02-09 (×3): qty 1

## 2013-02-09 MED ORDER — ASPIRIN 81 MG PO TBEC: 81.0000 mg | DELAYED_RELEASE_TABLET | Freq: Every day | ORAL | Status: DC

## 2013-02-09 NOTE — H&P (Signed)
Date: 02/09/2013               Patient Name:  [name] MRN: [id number]  DOB: 1922/05/05 Age / Sex: 79 y.o., female   PCP: No primary provider on file.         Medical Service: Internal Medicine Teaching Service         Attending Physician: Dr. Farley Ly, MD    First Contact: Dr. Evelena Peat Pager: [phone]  Second Contact: Dr. Lorretta Harp Pager: [phone]       After Hours (After 5p/  First Contact Pager: [phone]  weekends / holidays): Second Contact Pager: [phone]   Chief Complaint: Recurrent falls  History of Present Illness: [name] is and 79 year old woman with a PMH of DM type 2, HTN, CAD and MI, macular degeneration, vertigo and frequent falls.  She presents to the ED today after two falls this weekend.  She says she took a medication that made her "feel drunk."  The first fall occurred early on Saturday morning when she got up to go to the bathroom.  She denies CP/palpitations/presyncope of syncope.  She denies tripping on anything or hitting her head.  She is not on anticoagulants.  The second fall occurred late last night.  She got up from watching tv and fell onto the carpet.  She said she attempted to grab the back of her living room chair as she fell.  After this second fall she pressed her LifeAlert button and EMS came to take her to the ED.  She says she has been falling for years.  She lives alone and uses a walker to ambulate.  An aide comes to the home several times per week.   Meds: Current Facility-Administered Medications  Medication Dose Route Frequency Provider Last Rate Last Dose  . 0.9 %  sodium chloride infusion  250 mL Intravenous PRN Lorretta Harp, MD      . 0.9 %  sodium chloride infusion   Intravenous Continuous Lorretta Harp, MD 100 mL/hr at 02/09/13 1946    . acetaminophen (TYLENOL) tablet 650 mg  650 mg Oral Q6H PRN Lorretta Harp, MD      . Melene Muller ON 02/10/2013] aspirin EC tablet 81 mg  81 mg Oral Daily Farley Ly, MD      . Melene Muller ON 02/10/2013]  docusate sodium (COLACE) capsule 100 mg  100 mg Oral Daily Lorretta Harp, MD      . gabapentin (NEURONTIN) capsule 300 mg  300 mg Oral QHS Lorretta Harp, MD      . guaifenesin (ROBITUSSIN) 100 MG/5ML syrup 100 mg  100 mg Oral Q4H PRN Farley Ly, MD      . heparin injection 5,000 Units  5,000 Units Subcutaneous Q8H Lorretta Harp, MD      . hydrocerin (EUCERIN) cream   Topical BID Farley Ly, MD      . Melene Muller ON 02/10/2013] insulin aspart (novoLOG) injection 0-9 Units  0-9 Units Subcutaneous TID WC Lorretta Harp, MD      . Melene Muller ON 02/10/2013] isosorbide mononitrate (IMDUR) 24 hr tablet 60 mg  60 mg Oral Daily Lorretta Harp, MD      . Melene Muller ON 02/10/2013] metoprolol tartrate (LOPRESSOR) tablet 25 mg  25 mg Oral Daily Lorretta Harp, MD      . nitroGLYCERIN (NITROSTAT) SL tablet 0.4 mg  0.4 mg Sublingual Q5 min PRN Lorretta Harp, MD      . Melene Muller ON 02/10/2013] pantoprazole (  PROTONIX) EC tablet 40 mg  40 mg Oral QAC breakfast Lorretta Harp, MD      . senna [facility]) tablet 8.6 mg  1 tablet Oral QHS Lorretta Harp, MD      . Melene Muller ON 02/10/2013] sertraline (ZOLOFT) tablet 100 mg  100 mg Oral Daily Lorretta Harp, MD      . sodium chloride 0.9 % injection 3 mL  3 mL Intravenous Q12H Lorretta Harp, MD      . sodium chloride 0.9 % injection 3 mL  3 mL Intravenous PRN Lorretta Harp, MD      . Melene Muller ON 02/13/2013] Vitamin D (Ergocalciferol) (DRISDOL) capsule 50,000 Units  50,000 Units Oral Q Fri Farley Ly, MD        Allergies: Allergies as of 02/09/2013 - Review Complete 02/09/2013  Allergen Reaction Noted  . Fluticasone-salmeterol Other (See Comments) 07/07/2009   Past Medical History  Diagnosis Date  . Vertigo   . Diabetes mellitus without complication   . Myocardial infarction   . Stroke   . Hernia    Past Surgical History  Procedure Laterality Date  . Cholecystectomy    . Abdominal surgery     History reviewed. No pertinent family history. History   Social History  . Marital Status: Widowed    Spouse Name: N/A      Number of Children: N/A  . Years of Education: N/A   Occupational History  . Not on file.   Social History Main Topics  . Smoking status: Former Games developer  . Smokeless tobacco: Not on file  . Alcohol Use: No  . Drug Use: No  . Sexual Activity: Not on file   Other Topics Concern  . Not on file   Social History Narrative  . No narrative on file    Review of Systems: Pertinent items are noted in HPI.  Physical Exam: Blood pressure 159/78, pulse 90, temperature 98 F (36.7 C), temperature source Oral, resp. rate 18, height 5\' 1"  (1.549 m), weight 71.215 kg (157 lb), SpO2 90.00%. General: resting in bed in NAD HEENT: PERRL, EOMI, no scleral icterus, R cataract Cardiac: Distant heart sounds, RRR, no rubs, murmurs or gallops Pulm: clear to auscultation bilaterally, moving normal volumes of air Abd: soft, nondistended, BS present, +mildly tender lower abdomen Ext: warm and well perfused, no pedal edema Neuro: alert and oriented X3, cranial nerves II-XII grossly intact, 5/5 MMS, negative Babinski  Lab results: Basic Metabolic Panel:  Recent Labs  16/10/96 1100  NA 141  K 4.4  CL 99  CO2 33*  GLUCOSE 286*  BUN 30*  CREATININE 1.32*  CALCIUM 9.5   CBC:  Recent Labs  02/09/13 1100  WBC 8.4  NEUTROABS 6.3  HGB 13.0  HCT 40.0  MCV 95.9  PLT 142*   Cardiac Enzymes:  Recent Labs  02/09/13 1100  TROPONINI <0.30   BNP:  Recent Labs  02/09/13 1800  PROBNP 318.7    Urinalysis:  Recent Labs  02/09/13 1050  COLORURINE YELLOW  LABSPEC 1.015  PHURINE 7.5  GLUCOSEU 250*  HGBUR MODERATE*  BILIRUBINUR NEGATIVE  KETONESUR NEGATIVE  PROTEINUR NEGATIVE  UROBILINOGEN 1.0  NITRITE NEGATIVE  LEUKOCYTESUR TRACE*   Imaging results:  Dg Chest 2 View  02/09/2013   CLINICAL DATA:  Confusion. Recent fall.  EXAM: CHEST  2 VIEW  COMPARISON:  09/28/2012  FINDINGS: Heart size remains within normal limits. Left lung scarring is stable. No evidence of acute  infiltrate or pulmonary edema. No evidence of  pleural effusion. No mass or lymphadenopathy identified.  IMPRESSION: Stable left lung scarring. No active disease.   Electronically Signed   By: Myles Rosenthal   On: 02/09/2013 12:02   Ct Head Wo Contrast  02/09/2013   *RADIOLOGY REPORT*  Clinical Data: Trauma from a fall.  CT HEAD WITHOUT CONTRAST  Technique:  Contiguous axial images were obtained from the base of the skull through the vertex without contrast.  Comparison: Head CT 09/28/2012.  Findings: Mild cerebral and cerebellar atrophy.  Extensive patchy and confluent areas of decreased attenuation throughout the deep and periventricular white matter of the cerebral hemispheres bilaterally, compatible with advanced chronic microvascular ischemic disease. No acute displaced skull fractures are identified.  No acute intracranial abnormality.  Specifically, no evidence of acute post-traumatic intracranial hemorrhage, no definite regions of acute/subacute cerebral ischemia, no focal mass, mass effect, hydrocephalus or abnormal intra or extra-axial fluid collections.  The visualized paranasal sinuses and mastoids are well pneumatized.  IMPRESSION: 1.  No acute displaced skull fractures or acute post-traumatic intracranial abnormalities. 2.  Mild cerebral and cerebellar atrophy with extensive chronic microvascular ischemic changes throughout the cerebral white matter, as above.   Original Report Authenticated By: Trudie Reed, M.D.   Other results: EKG: normal sinus rhythm.  Assessment & Plan by Problem: 79 year old female with DM type 2, HTN, CAD and MI, macular degeneration, vertigo and frequent falls.  #Fall - Unclear etiology.  Likely orthostatic 2/2 to dehydration. BUN/Cr ratio > 20 consistent with pre-renal state.  + orthostatic BP.  Differential includes cardiac or neurologic etiology, however troponin is negative, no ishcemic changes on EKG and she denies CP/palpitations/pre-syncope or syncope making  cardiac cause less likely.  Also, CT head negative, no weakness/numbness or focal deficits making stroke unlikely. - admit to IMTS with telemetry - hold Lasix  - hold Miralax - give IV NS 100cc/hr - Repeat troponin tomorrow AM  #CAD - stable, no chest pain - initial troponin negative - continue 81mg  ASA, metoprolol  #DM type 2 - Hgb A1c 8.4 on 11/28/2010; on glimepiride 4mg  at home - check Hgb A1c - hold glimepiride - SSI  #CKD3 - Cr. 1.32; prior values have ranged 1.00-1.37, most likely elevated due to pre-renal state. - continue NS at 100cc/hr  Dispo: Disposition is deferred at this time, awaiting improvement of current medical problems. Anticipated discharge in approximately 1-2 day(s).   The patient does not have a current PCP (No primary provider on file.) and does need an [facility] hospital follow-up appointment after discharge.  The patient does not know have transportation limitations that hinder transportation to clinic appointments.  Signed: Evelena Peat, DO 02/09/2013, 8:07 PM

## 2013-02-09 NOTE — ED Notes (Signed)
Per PTAR, pt has neuropathy for which she takes neurontin for but was told to stop taking it because it makes her hallucinate. Uses a walker at home. Lives by herself in a apt. Had fallen this past weekend but did not come in to be checked out for it. Has a hematoma to the back of her head. Pt is alert and oriented.

## 2013-02-09 NOTE — ED Provider Notes (Signed)
Pt is an 79 y/o female, has had frequent falls recently, they appear to be mechanical the patient is unsure exactly why she is falling. On exam she is clear heart and lung sounds, strong pulses at the radial arteries, soft abdomen and no peripheral edema. She appears to have normal strength and sensation of all 4 extremities and follows commands without difficulty. There is some concern for the patient's safety she continues to have stools at home and is in an unmonitored and environment without any assistance or aid.  I saw and evaluated the patient, reviewed the resident's note and I agree with the findings and plan.  Please see my separate note regarding my evaluation of the patient.  I have personally seen and interpret the EKG and agree with the resident's interpretation  Clinical Impression:  Frequent falls.   Vida Roller, MD 02/09/13 [phone]

## 2013-02-09 NOTE — ED Provider Notes (Signed)
CSN: [id number]     Arrival date & time 02/09/13  1031 History     First MD Initiated Contact with Patient 02/09/13 1032     Chief Complaint  Patient presents with  . Fall   (Consider location/radiation/quality/duration/timing/severity/associated sxs/prior Treatment) Patient is a 79 y.o. female presenting with fall. The history is provided by the patient and the EMS personnel.  Fall This is a recurrent problem. Episode onset: once today and once yesterday. The problem occurs every several days. The problem has been unchanged. Associated symptoms include numbness (chronic numbness in the feet). Pertinent negatives include no abdominal pain, chest pain, chills, congestion, coughing, diaphoresis, fatigue, fever, headaches, nausea, neck pain, rash, sore throat or vomiting. Nothing aggravates the symptoms. She has tried nothing for the symptoms. The treatment provided no relief.    Past Medical History  Diagnosis Date  . Vertigo   . Diabetes mellitus without complication   . Myocardial infarction   . Stroke   . Hernia    Past Surgical History  Procedure Laterality Date  . Cholecystectomy    . Abdominal surgery     History reviewed. No pertinent family history. History  Substance Use Topics  . Smoking status: Former Games developer  . Smokeless tobacco: Not on file  . Alcohol Use: No   OB History   Grav Para Term Preterm Abortions TAB SAB Ect Mult Living                 Review of Systems  Constitutional: Negative for fever, chills, diaphoresis and fatigue.  HENT: Negative for ear pain, congestion, sore throat, facial swelling, mouth sores, trouble swallowing, neck pain and neck stiffness.   Eyes: Negative.   Respiratory: Negative for apnea, cough, chest tightness, shortness of breath and wheezing.   Cardiovascular: Negative for chest pain, palpitations and leg swelling.  Gastrointestinal: Negative for nausea, vomiting, abdominal pain, diarrhea and abdominal distention.    Genitourinary: Negative for hematuria, flank pain, vaginal discharge, difficulty urinating and menstrual problem.  Musculoskeletal: Negative for back pain and gait problem.  Skin: Negative for rash and wound.  Neurological: Positive for numbness (chronic numbness in the feet). Negative for dizziness, tremors, seizures, syncope, facial asymmetry and headaches.  Psychiatric/Behavioral: Negative.   All other systems reviewed and are negative.    Allergies  Fluticasone-salmeterol  Home Medications   Current Outpatient Rx  Name  Route  Sig  Dispense  Refill  . acetaminophen (TYLENOL) 325 MG tablet   Oral   Take 650 mg by mouth every 6 (six) hours as needed. Pain          . aspirin 81 MG EC tablet   Oral   Take 81 mg by mouth daily.           Marland Kitchen docusate sodium (COLACE) 100 MG capsule   Oral   Take 100 mg by mouth daily.          . ergocalciferol (VITAMIN D2) 50000 UNITS capsule   Oral   Take 50,000 Units by mouth once a week.         . furosemide (LASIX) 20 MG tablet   Oral   Take 20 mg by mouth daily.         Marland Kitchen gabapentin (NEURONTIN) 300 MG capsule   Oral   Take 300 mg by mouth at bedtime.         Marland Kitchen glimepiride (AMARYL) 4 MG tablet   Oral   Take 4 mg by mouth daily before breakfast.         .  guaiFENesin (ROBITUSSIN) 100 MG/5ML liquid   Oral   Take 100 mg by mouth every 4 (four) hours as needed for cough or congestion.         . isosorbide mononitrate (IMDUR) 60 MG 24 hr tablet   Oral   Take 60 mg by mouth daily.          . metoprolol tartrate (LOPRESSOR) 25 MG tablet   Oral   Take 25 mg by mouth daily.         . nitroGLYCERIN (NITROSTAT) 0.4 MG SL tablet   Sublingual   Place 0.4 mg under the tongue every 5 (five) minutes as needed for chest pain.          . pantoprazole (PROTONIX) 40 MG tablet   Oral   Take 40 mg by mouth daily.           . polyethylene glycol (MIRALAX / GLYCOLAX) packet   Oral   Take 17 g by mouth 2 (two) times  daily.         Marland Kitchen senna (SENOKOT) 8.6 MG TABS   Oral   Take 1 tablet by mouth at bedtime. Sunday         . sertraline (ZOLOFT) 100 MG tablet   Oral   Take 100 mg by mouth daily.           . Skin Protectants, Misc. (EUCERIN) cream   Topical   Apply 1 application topically 2 (two) times daily.          BP 132/73  Pulse 99  Temp(Src) 98.2 F (36.8 C) (Oral)  Resp 18  SpO2 94% Physical Exam  Nursing note and vitals reviewed. Constitutional: She is oriented to person, place, and time. She appears well-developed and well-nourished. No distress.  HENT:  Head: Normocephalic. Head is with contusion.    Right Ear: External ear normal.  Left Ear: External ear normal.  Nose: Nose normal.  Mouth/Throat: Oropharynx is clear and moist. No oropharyngeal exudate.  Eyes: Conjunctivae and EOM are normal. Pupils are equal, round, and reactive to light. Right eye exhibits no discharge. Left eye exhibits no discharge.  Neck: Normal range of motion. Neck supple. No JVD present. No tracheal deviation present. No thyromegaly present.  Cardiovascular: Normal rate, regular rhythm, normal heart sounds and intact distal pulses.  Exam reveals no gallop and no friction rub.   No murmur heard. Pulmonary/Chest: Effort normal and breath sounds normal. No respiratory distress. She has no wheezes. She has no rales. She exhibits no tenderness.  Abdominal: Soft. Bowel sounds are normal. She exhibits no distension. There is no tenderness. There is no rebound and no guarding.  Musculoskeletal: Normal range of motion.  Lymphadenopathy:    She has no cervical adenopathy.  Neurological: She is alert and oriented to person, place, and time. She has normal strength. No cranial nerve deficit. Coordination and gait normal. GCS eye subscore is 4. GCS verbal subscore is 5. GCS motor subscore is 6.  Patient with decreased sensation to feet  Skin: Skin is warm. No rash noted. She is not diaphoretic.  Psychiatric:  She has a normal mood and affect. Her behavior is normal. Judgment and thought content normal.    ED Course   Procedures (including critical care time)  Labs Reviewed  CBC WITH DIFFERENTIAL - Abnormal; Notable for the following:    Platelets 142 (*)    All other components within normal limits  BASIC METABOLIC PANEL - Abnormal; Notable for the following:  CO2 33 (*)    Glucose, Bld 286 (*)    BUN 30 (*)    Creatinine, Ser 1.32 (*)    GFR calc non Af Amer 35 (*)    GFR calc Af Amer 40 (*)    All other components within normal limits  URINALYSIS, ROUTINE W REFLEX MICROSCOPIC - Abnormal; Notable for the following:    APPearance CLOUDY (*)    Glucose, UA 250 (*)    Hgb urine dipstick MODERATE (*)    Leukocytes, UA TRACE (*)    All other components within normal limits  URINE MICROSCOPIC-ADD ON - Abnormal; Notable for the following:    Squamous Epithelial / LPF FEW (*)    Bacteria, UA MANY (*)    All other components within normal limits  URINE CULTURE  TROPONIN I   Dg Chest 2 View  02/09/2013   CLINICAL DATA:  Confusion. Recent fall.  EXAM: CHEST  2 VIEW  COMPARISON:  09/28/2012  FINDINGS: Heart size remains within normal limits. Left lung scarring is stable. No evidence of acute infiltrate or pulmonary edema. No evidence of pleural effusion. No mass or lymphadenopathy identified.  IMPRESSION: Stable left lung scarring. No active disease.   Electronically Signed   By: Myles Rosenthal   On: 02/09/2013 12:02   Ct Head Wo Contrast  02/09/2013   *RADIOLOGY REPORT*  Clinical Data: Trauma from a fall.  CT HEAD WITHOUT CONTRAST  Technique:  Contiguous axial images were obtained from the base of the skull through the vertex without contrast.  Comparison: Head CT 09/28/2012.  Findings: Mild cerebral and cerebellar atrophy.  Extensive patchy and confluent areas of decreased attenuation throughout the deep and periventricular white matter of the cerebral hemispheres bilaterally, compatible with  advanced chronic microvascular ischemic disease. No acute displaced skull fractures are identified.  No acute intracranial abnormality.  Specifically, no evidence of acute post-traumatic intracranial hemorrhage, no definite regions of acute/subacute cerebral ischemia, no focal mass, mass effect, hydrocephalus or abnormal intra or extra-axial fluid collections.  The visualized paranasal sinuses and mastoids are well pneumatized.  IMPRESSION: 1.  No acute displaced skull fractures or acute post-traumatic intracranial abnormalities. 2.  Mild cerebral and cerebellar atrophy with extensive chronic microvascular ischemic changes throughout the cerebral white matter, as above.   Original Report Authenticated By: Trudie Reed, M.D.   1. Falls, initial encounter     MDM  79 yr old F pt with PMH of CVA, DM, and diabetic neuropathy here after multiple falls over the past couple of days. She says yesterday she lost her balance. A fireman cam and broke her window to help her get up. She says she felt fine and chose not to come to the ED. Today she fell again when a mechanic was there to help her. This time he helped her up but then she decided to come to ED. No CP, SOB, abdominal pain, HA, neck pain. Patient only with small contusion to the occiput. Given her age and multiple comorbidities she is high risk. Will get EKG, CXR, troponin and labs to explore why she might be having increased frequency of falls but it could be do to neuropathy issues. Will admit given the increase frequency of falls but no UTI on labs, normal CXR and baseline labs   Date: 02/09/2013  Rate: 90  Rhythm: normal sinus rhythm  QRS Axis: normal  Intervals: normal  ST/T Wave abnormalities: nonspecific T wave changes  Conduction Disutrbances:none  Narrative Interpretation:   Old EKG  Reviewed: unchanged  Case discussed with Dr. Erin Hearing, MD 02/09/13 [phone]

## 2013-02-09 NOTE — ED Notes (Signed)
Returned from xray

## 2013-02-09 NOTE — ED Notes (Signed)
Dr. Lew Dawes unable to reach the daughter by the number she gave [facility]. Attempted to call the other number on the demographic form and unable to reach the daughter.

## 2013-02-09 NOTE — ED Provider Notes (Signed)
I saw and evaluated the patient, reviewed the resident's note and I agree with the findings and plan.  Please see my separate note regarding my evaluation of the patient.  Clinical Impression:  Frequent Falls   Vida Roller, MD 02/09/13 [phone]

## 2013-02-09 NOTE — Progress Notes (Signed)
Patient arrived to floor from ED.  Alert and oriented, VSS, no complaints of pain.  Oriented patient to unit, bed in low position, call bell in reach, bed alarm on.  See doc flowsheets for full assessment.

## 2013-02-09 NOTE — ED Notes (Signed)
Daughter's number Harriett Sine [phone]

## 2013-02-09 NOTE — ED Notes (Signed)
Dr. Lew Dawes back at the bedside.

## 2013-02-10 LAB — COMPREHENSIVE METABOLIC PANEL
Albumin: 3.2 g/dL — ABNORMAL LOW (ref 3.5–5.2)
Alkaline Phosphatase: 99 U/L (ref 39–117)
BUN: 22 mg/dL (ref 6–23)
Calcium: 9.4 mg/dL (ref 8.4–10.5)
GFR calc Af Amer: 47 mL/min — ABNORMAL LOW (ref >90)
Glucose, Bld: 214 mg/dL — ABNORMAL HIGH (ref 70–99)
Potassium: 4.8 mEq/L (ref 3.5–5.1)
Sodium: 142 mEq/L (ref 135–145)
Total Protein: 6.7 g/dL (ref 6.0–8.3)

## 2013-02-10 LAB — HEMOGLOBIN A1C: Hgb A1c MFr Bld: 9.3 % — ABNORMAL HIGH (ref <5.7)

## 2013-02-10 LAB — TROPONIN I: Troponin I: <0.3 ng/mL (ref <0.30)

## 2013-02-10 LAB — PROTIME-INR: Prothrombin Time: 12.5 seconds (ref 11.6–15.2)

## 2013-02-10 LAB — LIPID PANEL
Cholesterol: 207 mg/dL — ABNORMAL HIGH (ref 0–200)
HDL: 49 mg/dL (ref >39)
LDL Cholesterol: 131 mg/dL — ABNORMAL HIGH (ref 0–99)
Triglycerides: 135 mg/dL (ref <150)

## 2013-02-10 LAB — GLUCOSE, CAPILLARY: Glucose-Capillary: 116 mg/dL — ABNORMAL HIGH (ref 70–99)

## 2013-02-10 NOTE — Evaluation (Signed)
Physical Therapy Evaluation Patient Details Name: [name] MRN: [id number] DOB: May 23, 1922 Today's Date: 02/10/2013 Time: 1017-1050 PT Time Calculation (min): 33 min  PT Assessment / Plan / Recommendation History of Present Illness  79 year old woman with history of type 2 diabetes mellitus, hypertension, coronary artery disease, MI, diastolic heart failure, GERD, hyperlipidemia, and other problems as outlined in the medical history now admitted following recurrent falls at home.  Patient is not sure why she fell; she denies presyncope/syncope, focal weakness, chest pain, palpitations, or other associated symptoms; she does report some past episodes of dizziness but these did not appear to be related to her recent falls.  Patient [facility] the [facility] and lives alone; she uses a walker to ambulate.  Clinical Impression  Pt admitted with recurrent falls. Pt currently with functional limitations due to the deficits listed below (see PT Problem List).  Pt will benefit from skilled PT to increase their independence and safety with mobility to allow discharge to the venue listed below. Pt states her increased falls started after she started taking a certain medication.  Pt lives alone and has HH aide 3x/week.  Recommend 24/7 assist upon d/c.  Pt would like to return to baseline and would benefit from ST-SNF prior to home.  Pt also states her walker at home has 3 wheels however one wheel was caught under her hospital bed and smashed so if she returns home, recommend RW.     PT Assessment  Patient needs continued PT services    Follow Up Recommendations  SNF;Supervision/Assistance - 24 hour    Does the patient have the potential to tolerate intense rehabilitation      Barriers to Discharge        Equipment Recommendations  Rolling walker with 5" wheels    Recommendations for Other Services     Frequency Min 3X/week    Precautions / Restrictions Precautions Precautions:  Fall Restrictions Weight Bearing Restrictions: No   Pertinent Vitals/Pain n/a      Mobility  Bed Mobility Bed Mobility: Supine to Sit Supine to Sit: 4: Min assist;HOB elevated Details for Bed Mobility Assistance: verbal cues for technique, assist for upper body upright Transfers Transfers: Sit to Stand;Stand to Sit Sit to Stand: 4: Min assist;With upper extremity assist;From bed;From toilet Stand to Sit: 4: Min assist;With upper extremity assist;To chair/3-in-1;To toilet Details for Transfer Assistance: verbal cues for hand placement, assist to rise and control descent Ambulation/Gait Ambulation/Gait Assistance: 4: Min guard Ambulation Distance (Feet): 160 Feet Assistive device: Rolling walker Ambulation/Gait Assistance Details: no unsteady gait or LOB observed, very slow pace, only c/o LE weakness but glad to be up walking Gait Pattern: Step-through pattern;Decreased stride length;Trunk flexed Gait velocity: decreased    Exercises     PT Diagnosis: Difficulty walking  PT Problem List: Decreased strength;Decreased activity tolerance;Decreased mobility;Decreased balance;Decreased knowledge of precautions PT Treatment Interventions: DME instruction;Gait training;Neuromuscular re-education;Balance training;Functional mobility training;Therapeutic activities;Therapeutic exercise;Patient/family education     PT Goals(Current goals can be found in the care plan section) Acute Rehab PT Goals PT Goal Formulation: With patient Time For Goal Achievement: 02/24/13 Potential to Achieve Goals: Good  Visit Information  Last PT Received On: 02/10/13 Assistance Needed: +1 History of Present Illness: 79 year old woman with history of type 2 diabetes mellitus, hypertension, coronary artery disease, MI, diastolic heart failure, GERD, hyperlipidemia, and other problems as outlined in the medical history now admitted following recurrent falls at home.  Patient is not sure why she fell; she  denies presyncope/syncope, focal weakness, chest pain, palpitations, or other associated symptoms; she does report some past episodes of dizziness but these did not appear to be related to her recent falls.  Patient is [facility] in the [facility] and lives alone; she uses a walker to ambulate.       Prior Functioning  Home Living Family/patient expects to be discharged to:: Private residence Living Arrangements: Alone Available Help at Discharge: Personal care attendant;Other (Comment) (aide 3x/week) Type of Home: House Home Access: Stairs to enter [employer] of Steps: 1 Entrance Stairs-Rails: None Home Layout: One level Home Equipment: Other (comment);Hospital bed (3 wheeled walker per pt report) Prior Function Level of Independence: Independent with assistive device(s) Communication Communication: No difficulties    Cognition  Cognition Arousal/Alertness: Awake/alert Behavior During Therapy: WFL for tasks assessed/performed Overall Cognitive Status: Within Functional Limits for tasks assessed    Extremity/Trunk Assessment Lower Extremity Assessment Lower Extremity Assessment: Generalized weakness  Pt able to feel light touch on bilateral feet identifying location correctly with eyes closed.  Balance    End of Session PT - End of Session Equipment Utilized During Treatment: Gait belt Activity Tolerance: Patient tolerated treatment well Patient left: in chair;with call bell/phone within reach Nurse Communication: Mobility status (nsg tech aware pt up in chair)  GP     Digby Groeneveld,KATHrine E 02/10/2013, 11:36 AM Zenovia Jarred, PT, DPT 02/10/2013 Pager: [phone]

## 2013-02-10 NOTE — Progress Notes (Signed)
Subjective: [name] was seen and examined by me this morning.  She denies CP, SOB and reports eating breakfast this morning.   Objective: Vital signs in last 24 hours: Filed Vitals:   02/09/13 1652 02/09/13 2039 02/10/13 0527 02/10/13 0926  BP: 159/78 148/62 150/83 143/75  Pulse: 90 89 88 82  Temp: 98 F (36.7 C) 98.4 F (36.9 C) 98 F (36.7 C) 98.2 F (36.8 C)  TempSrc: Oral Oral Oral Oral  Resp: 18 18 18 18   Height: 5\' 1"  (1.549 m) 5\' 1"  (1.549 m)    Weight: 71.215 kg (157 lb) 71.215 kg (157 lb)    SpO2: 90% 92% 93% 96%   Weight change:   Intake/Output Summary (Last 24 hours) at 02/10/13 1101 Last data filed at 02/10/13 0901  Gross per 24 hour  Intake 1518.33 ml  Output      0 ml  Net 1518.33 ml   General: resting in bed in NAD HEENT: Mucous membranes moist Cardiac: RRR, no rubs, murmurs or gallops Pulm: clear to auscultation bilaterally, moving normal volumes of air Abd: soft, nontender, nondistended, BS present Ext: warm and well perfused, no pedal edema Neuro: alert and oriented X3  Lab Results: Basic Metabolic Panel:  Recent Labs Lab 02/09/13 1100 02/10/13 0545  NA 141 142  K 4.4 4.8  CL 99 103  CO2 33* 33*  GLUCOSE 286* 214*  BUN 30* 22  CREATININE 1.32* 1.16*  CALCIUM 9.5 9.4   Liver Function Tests:  Recent Labs Lab 02/10/13 0545  AST 19  ALT 15  ALKPHOS 99  BILITOT 0.3  PROT 6.7  ALBUMIN 3.2*   CBC:  Recent Labs Lab 02/09/13 1100  WBC 8.4  NEUTROABS 6.3  HGB 13.0  HCT 40.0  MCV 95.9  PLT 142*   Cardiac Enzymes:  Recent Labs Lab 02/09/13 1100 02/10/13 0555  TROPONINI <0.30 <0.30   BNP:  Recent Labs Lab 02/09/13 1800  PROBNP 318.7   CBG:  Recent Labs Lab 02/09/13 2044  GLUCAP 205*   Hemoglobin A1C:  Recent Labs Lab 02/09/13 1702  HGBA1C 9.3*   Fasting Lipid Panel:  Recent Labs Lab 02/10/13 0545  CHOL 207*  HDL 49  LDLCALC 131*  TRIG 135  CHOLHDL 4.2   Coagulation:  Recent Labs Lab  02/10/13 0545  LABPROT 12.5  INR 0.95   Urinalysis:  Recent Labs Lab 02/09/13 1050  COLORURINE YELLOW  LABSPEC 1.015  PHURINE 7.5  GLUCOSEU 250*  HGBUR MODERATE*  BILIRUBINUR NEGATIVE  KETONESUR NEGATIVE  PROTEINUR NEGATIVE  UROBILINOGEN 1.0  NITRITE NEGATIVE  LEUKOCYTESUR TRACE*    Micro Results: Recent Results (from the past 240 hour(s))  URINE CULTURE     Status: None   Collection Time    02/09/13 10:50 AM      Result Value Range Status   Specimen Description URINE, RANDOM   Final   Special Requests NONE   Final   Culture  Setup Time     Final   Value: 02/09/2013 15:11     Performed at [facility] Count     Final   Value: >=100,000 COLONIES/ML     Performed at [facility]   Culture     Final   Value: GRAM NEGATIVE RODS     Performed at [facility]   Report Status PENDING   Incomplete   Studies/Results: Dg Chest 2 View  02/09/2013   CLINICAL DATA:  Confusion. Recent fall.  EXAM:  CHEST  2 VIEW  COMPARISON:  09/28/2012  FINDINGS: Heart size remains within normal limits. Left lung scarring is stable. No evidence of acute infiltrate or pulmonary edema. No evidence of pleural effusion. No mass or lymphadenopathy identified.  IMPRESSION: Stable left lung scarring. No active disease.   Electronically Signed   By: Myles Rosenthal   On: 02/09/2013 12:02   Ct Head Wo Contrast  02/09/2013   *RADIOLOGY REPORT*  Clinical Data: Trauma from a fall.  CT HEAD WITHOUT CONTRAST  Technique:  Contiguous axial images were obtained from the base of the skull through the vertex without contrast.  Comparison: Head CT 09/28/2012.  Findings: Mild cerebral and cerebellar atrophy.  Extensive patchy and confluent areas of decreased attenuation throughout the deep and periventricular white matter of the cerebral hemispheres bilaterally, compatible with advanced chronic microvascular ischemic disease. No acute displaced skull fractures are identified.  No acute  intracranial abnormality.  Specifically, no evidence of acute post-traumatic intracranial hemorrhage, no definite regions of acute/subacute cerebral ischemia, no focal mass, mass effect, hydrocephalus or abnormal intra or extra-axial fluid collections.  The visualized paranasal sinuses and mastoids are well pneumatized.  IMPRESSION: 1.  No acute displaced skull fractures or acute post-traumatic intracranial abnormalities. 2.  Mild cerebral and cerebellar atrophy with extensive chronic microvascular ischemic changes throughout the cerebral white matter, as above.   Original Report Authenticated By: Trudie Reed, JoannaD.   Medications: I have reviewed the patient's current medications. Scheduled Meds: . aspirin EC  81 mg Oral Daily  . docusate sodium  100 mg Oral Daily  . heparin  5,000 Units Subcutaneous Q8H  . hydrocerin   Topical BID  . insulin aspart  0-9 Units Subcutaneous TID WC  . isosorbide mononitrate  60 mg Oral Daily  . metoprolol tartrate  25 mg Oral Daily  . pantoprazole  40 mg Oral QAC breakfast  . senna  1 tablet Oral QHS  . sertraline  100 mg Oral Daily  . sodium chloride  3 mL Intravenous Q12H  . [START ON 02/13/2013] Vitamin D (Ergocalciferol)  50,000 Units Oral Q Fri   Continuous Infusions: . sodium chloride 100 mL/hr at 02/10/13 0622   PRN Meds:.sodium chloride, acetaminophen, guaifenesin, nitroGLYCERIN, sodium chloride  Assessment/Plan: 79 year old female with DM type 2, HTN, CAD and MI, macular degeneration, vertigo and frequent falls.   #Fall - Unclear etiology. Likely orthostatic 2/2 to dehydration. BUN/Cr ratio > 20 consistent with pre-renal state. + orthostatic BP.  Differential includes cardiac or neurologic etiology, however troponin is negative, no ishcemic changes on EKG and she denies CP/palpitations/pre-syncope or syncope making cardiac cause less likely.  Also, CT head negative, no weakness/numbness or focal deficits making stroke unlikely.  - Ms. Bailey is a  [facility] participant and appreciate Dr. Malon Kindle recommendations; Gabapentin was recently started for peripheral neuropathy and may have contributed to fall; will d/c this med and others that may increase her fall risk. - continue telemetry monitoring - hold Lasix and miralax - discontinue Gabapentin - give IV NS 100cc/hr  - PT/OT eval and recommendations appreciated; recommend SNF/24 hour assistance - [facility] social worker will be in contact with case manager regarding dispo  #CAD - stable, no chest pain  - initial troponin negative  - continue 81mg  ASA, metoprolol   #DM type 2 - Hgb A1c 9.3; on glimepiride 4mg  at home   - hold glimepiride  - SSI   #CKD3 - Cr. 1.32-->1.16 today; prior values have ranged 1.00-1.37, most likely elevated  due to pre-renal state.  - continue NS at 100cc/hr  Dispo: Disposition is deferred at this time, awaiting improvement of current medical problems.  Anticipated discharge in approximately 1-2 day(s).   The patient does not have a current PCP (No primary provider on file.) and does need an [facility] hospital follow-up appointment after discharge.  The patient does not know have transportation limitations that hinder transportation to clinic appointments.  .Services Needed at time of discharge: Y = Yes, Blank = No PT:   OT:   RN:   Equipment:   Other:     LOS: 1 day   Evelena Peat, DO 02/10/2013, 11:01 AM

## 2013-02-10 NOTE — Progress Notes (Signed)
Patient ID: [name], female   DOB: 20-Jan-1922, 79 y.o.   MRN: [id number]  [name] is an 79 yo female very well known to our [facility]. The following is a copy of some of my notes dated 12/05/2012:   ------------------------------------------------------   "[name] is an 79 year old female who was one of our original participants. She has been with [facility] since December 16, 2009. She has a variety of medical problems that have been stable. She is legally blind. She has a history of type 2 diabetes, coronary artery disease, and hypercholesterolemia, hypertension, COPD, stage III CKD status post left nephrectomy, macular degeneration and essentially blind, GERD, and quite hard of hearing. Depression has been under good control....  [name] has been relatively stable.  She has scored better with her MMSE.  She may have some mild dementia but she tends to do whatever she wants to do.  She makes her own decisions regarding medications and it seems to be impossible to convince her to make changes.  Therefore we will meet her where she is at and encourage her to take the medications that she is willing to take.  Her depression is doing well.  Diabetes not under control but it will have to do.  Coronary artery disease continues to have stable angina.  Her stage III CKD is fairly stable.  She does have some vision but it's fairly minimal.  She is due for an eye exam and we can schedule this.  I would like to check an A1c in September.  Flu shot in the fall. We do not need to repeat blood work until May 2015."   -------------------------------------------------------  I have just spoken with Dr. Andrey Campanile about our program and her care with [place]. I noted that we started gabapentin on 01/15/2013 to address some neuropathy type complaints.  [name] is prone to falls and is frail. In retrospect, adding the gabapentin increased her risk of falls and was probably, at least in part, responsible for  this. I recommend stopping this and, in general, avoid medications that can increase her risk of falls.  Our social worker will be in contact with your case manager. I believe she is more of a disposition problem than a medical mystery. Please do not hesitate to contact our office at [phone] or my back line number of [phone]. My cell is [phone].  Thank you for your excellent care.  [name], Medical Director of [facility]

## 2013-02-10 NOTE — Progress Notes (Signed)
Patient active with [facility] program prior to admission. PT, OT at the center Home health aide at home  Contact person: Eliberto Ivory SW [phone]  NCM to continue to follow for discharge planning needs.

## 2013-02-10 NOTE — Progress Notes (Signed)
Utilization review completed.  

## 2013-02-10 NOTE — Progress Notes (Signed)
Inpatient Diabetes Program Recommendations  AACE/ADA: New Consensus Statement on Inpatient Glycemic Control (2013)  Target Ranges:  Prepandial:   less than 140 mg/dL      Peak postprandial:   less than 180 mg/dL (1-2 hours)      Critically ill patients:  140 - 180 mg/dL     Results for [name], [name] (MRN [id number]) as of 02/10/2013 12:07  Ref. Range 02/10/2013 11:47  Glucose-Capillary Latest Range: 70-99 mg/dL 409 (H)    Results for [name], [name] (MRN [id number]) as of 02/10/2013 12:07  Ref. Range 02/09/2013 17:02  Hemoglobin A1C Latest Range: <5.7 % 9.3 (H)    **Noted patient admitted with recurrent falls.  Per notes, patient is currently enrolled in the [facility] for the [facility].    **Noted from Dr. Malon Kindle notes that patient "makes her own decisions regarding medications and it seems to be impossible to convince her to make changes. Therefore we will meet her where she is at and encourage her to take the medications that she is willing to take."    **A1c suboptimal, however, unsure if patient would be willing to take insulin at home.  Additionally, patient lives by herself and only has a HH aide 3x per week.  Not sure if attempts at tight glucose control are best.  Per ADA standards, patients >73 years of age and patients at high risk for hypoglycemia may be served better by an A1c of 8%.    **MD- If better control desired in hospital, may want to add basal insulin to patient's in-hospital regimen.  Could start with Levemir 10 units QHS (this is less than 0.2 units/kg dosing)     Will follow. Ambrose Finland RN, MSN, CDE Diabetes Coordinator Inpatient Diabetes Program [phone]

## 2013-02-10 NOTE — H&P (Signed)
Internal Medicine Attending Admission Note Date: 02/10/2013  Patient name: [name] Medical record number: [id number] Date of birth: May 01, 1922 Age: 79 y.o. Gender: female  I saw and evaluated the patient. I reviewed the resident's note and I agree with the resident's findings and plan as documented in the resident's note, with the following additional comments.  Chief Complaint(s): Recurrent falls  History - key components related to admission: Patient is an 79 year old woman with history of type 2 diabetes mellitus, hypertension, coronary artery disease, MI, diastolic heart failure, GERD, hyperlipidemia, and other problems as outlined in the medical history now admitted following recurrent falls at home.  Patient is not sure why she fell; she denies presyncope/syncope, focal weakness, chest pain, palpitations, or other associated symptoms; she does report some past episodes of dizziness but these did not appear to be related to her recent falls.  Patient is followed in the [facility] and lives alone; she uses a walker to ambulate.   Physical Exam - key components related to admission:  Filed Vitals:   02/09/13 1652 02/09/13 2039 02/10/13 0527 02/10/13 0926  BP: 159/78 148/62 150/83 143/75  Pulse: 90 89 88 82  Temp: 98 F (36.7 C) 98.4 F (36.9 C) 98 F (36.7 C) 98.2 F (36.8 C)  TempSrc: Oral Oral Oral Oral  Resp: 18 18 18 18   Height: 5\' 1"  (1.549 m) 5\' 1"  (1.549 m)    Weight: 157 lb (71.215 kg) 157 lb (71.215 kg)    SpO2: 90% 92% 93% 96%    General: Alert, no distress Lungs: Clear Heart: Regular; no extra sounds or murmurs Abdomen: Bowel sounds present, soft, nontender Extremities: No edema Neurologic: Alert and oriented; cranial nerves intact; motor intact.  Lab results:   Basic Metabolic Panel:  Recent Labs  44/01/02 1100 02/10/13 0545  NA 141 142  K 4.4 4.8  CL 99 103  CO2 33* 33*  GLUCOSE 286* 214*  BUN 30* 22  CREATININE 1.32* 1.16*  CALCIUM 9.5  9.4    Liver Function Tests:  Recent Labs  02/10/13 0545  AST 19  ALT 15  ALKPHOS 99  BILITOT 0.3  PROT 6.7  ALBUMIN 3.2*     CBC:  Recent Labs  02/09/13 1100  WBC 8.4  HGB 13.0  HCT 40.0  MCV 95.9  PLT 142*     Recent Labs  02/09/13 1100  NEUTROABS 6.3  LYMPHSABS 1.3  MONOABS 0.6  EOSABS 0.2  BASOSABS 0.0    Cardiac Enzymes:  Recent Labs  02/09/13 1100 02/10/13 0555  TROPONINI <0.30 <0.30    BNP:  Recent Labs  02/09/13 1800  PROBNP 318.7     CBG:  Recent Labs  02/09/13 2044  GLUCAP 205*    Hemoglobin A1C:  Recent Labs  02/09/13 1702  HGBA1C 9.3*    Fasting Lipid Panel:  Recent Labs  02/10/13 0545  CHOL 207*  HDL 49  LDLCALC 131*  TRIG 135  CHOLHDL 4.2     Coagulation:  Recent Labs  02/10/13 0545  INR 0.95     Urinalysis    Component Value Date/Time   COLORURINE YELLOW 02/09/2013 1050   APPEARANCEUR CLOUDY* 02/09/2013 1050   LABSPEC 1.015 02/09/2013 1050   PHURINE 7.5 02/09/2013 1050   GLUCOSEU 250* 02/09/2013 1050   HGBUR MODERATE* 02/09/2013 1050   HGBUR negative 03/13/2007 1052   BILIRUBINUR NEGATIVE 02/09/2013 1050   KETONESUR NEGATIVE 02/09/2013 1050   PROTEINUR NEGATIVE 02/09/2013 1050   UROBILINOGEN 1.0 02/09/2013 1050  NITRITE NEGATIVE 02/09/2013 1050   LEUKOCYTESUR TRACE* 02/09/2013 1050    Urine microscopic:  Recent Labs  02/09/13 1050  EPIU FEW*  WBCU 3-6  RBCU 3-6  BACTERIA MANY*     Imaging results:  Dg Chest 2 View  02/09/2013   CLINICAL DATA:  Confusion. Recent fall.  EXAM: CHEST  2 VIEW  COMPARISON:  09/28/2012  FINDINGS: Heart size remains within normal limits. Left lung scarring is stable. No evidence of acute infiltrate or pulmonary edema. No evidence of pleural effusion. No mass or lymphadenopathy identified.  IMPRESSION: Stable left lung scarring. No active disease.   Electronically Signed   By: Myles Rosenthal   On: 02/09/2013 12:02   Ct Head Wo Contrast  02/09/2013   *RADIOLOGY  REPORT*  Clinical Data: Trauma from a fall.  CT HEAD WITHOUT CONTRAST  Technique:  Contiguous axial images were obtained from the base of the skull through the vertex without contrast.  Comparison: Head CT 09/28/2012.  Findings: Mild cerebral and cerebellar atrophy.  Extensive patchy and confluent areas of decreased attenuation throughout the deep and periventricular white matter of the cerebral hemispheres bilaterally, compatible with advanced chronic microvascular ischemic disease. No acute displaced skull fractures are identified.  No acute intracranial abnormality.  Specifically, no evidence of acute post-traumatic intracranial hemorrhage, no definite regions of acute/subacute cerebral ischemia, no focal mass, mass effect, hydrocephalus or abnormal intra or extra-axial fluid collections.  The visualized paranasal sinuses and mastoids are well pneumatized.  IMPRESSION: 1.  No acute displaced skull fractures or acute post-traumatic intracranial abnormalities. 2.  Mild cerebral and cerebellar atrophy with extensive chronic microvascular ischemic changes throughout the cerebral white matter, as above.   Original Report Authenticated By: Trudie Reed, M.D.    Other results: EKG: Sinus rhythm; right axis deviation; nonspecific T wave changes   Assessment & Plan by Problem:  1.  Recurrent falls.  It is unclear what is precipitating patient's falls.  The differential includes gabapentin effect as per Dr. Dorothe Pea, orthostatic hypotension, disequilibrium related to neuropathy, deconditioning, or other problems.  Plan is stop gabapentin; hold diuretic and give gentle IV volume replacement; OT/PT consults.    2.  Anticipated disposition.  The situation is obviously concerning for her home safety, but the patient is insistent that she does not want to go into a skilled nursing facility.  We should discuss this again after she has been evaluated by OT/PT.  Once patient is medically stable for discharge, will  coordinate planning with Dr. Dorothe Pea and the [facility] program.  3.  Other problems and plans as per the resident physician's note.

## 2013-02-10 NOTE — Progress Notes (Signed)
On call MD made aware of patient's hypotension.  Blood pressure has improved.  MD made aware of this value as well.  MD to review chart and advise as needed.  Patient without complaints of fatigue, dizziness, shortness of breath, pain, nausea or vertigo.  Will continue to monitor patient.

## 2013-02-11 DIAGNOSIS — I959 Hypotension, unspecified: Secondary | ICD-10-CM

## 2013-02-11 LAB — CBC WITH DIFFERENTIAL/PLATELET
Eosinophils Absolute: 0.1 10*3/uL (ref 0.0–0.7)
HCT: 40.4 % (ref 36.0–46.0)
Hemoglobin: 12.7 g/dL (ref 12.0–15.0)
Lymphs Abs: 1.4 10*3/uL (ref 0.7–4.0)
MCH: 30.3 pg (ref 26.0–34.0)
MCHC: 31.4 g/dL (ref 30.0–36.0)
Monocytes Absolute: 0.5 10*3/uL (ref 0.1–1.0)
Monocytes Relative: 8 % (ref 3–12)
Neutrophils Relative %: 67 % (ref 43–77)
RBC: 4.19 MIL/uL (ref 3.87–5.11)

## 2013-02-11 LAB — URINE CULTURE: Colony Count: >=100000

## 2013-02-11 LAB — GLUCOSE, CAPILLARY
Glucose-Capillary: 172 mg/dL — ABNORMAL HIGH (ref 70–99)
Glucose-Capillary: 156 mg/dL — ABNORMAL HIGH (ref 70–99)
Glucose-Capillary: 192 mg/dL — ABNORMAL HIGH (ref 70–99)

## 2013-02-11 LAB — BASIC METABOLIC PANEL
BUN: 23 mg/dL (ref 6–23)
Chloride: 106 mEq/L (ref 96–112)
Creatinine, Ser: 1.1 mg/dL (ref 0.50–1.10)
GFR calc non Af Amer: 44 mL/min — ABNORMAL LOW (ref >90)
Glucose, Bld: 188 mg/dL — ABNORMAL HIGH (ref 70–99)
Potassium: 4 mEq/L (ref 3.5–5.1)

## 2013-02-11 NOTE — Clinical Social Work Placement (Signed)
Clinical Social Work Department CLINICAL SOCIAL WORK PLACEMENT NOTE 02/11/2013  Patient:  [name], [name]  Account Number:  [id number] Admit date:  02/09/2013  Clinical Social Worker:  Genelle Bal, LCSW  Date/time:  02/11/2013 01:07 AM  Clinical Social Work is seeking post-discharge placement for this patient at the following level of care:   SKILLED NURSING   (*CSW will update this form in Epic as items are completed)     Patient/family provided with [facility] Department of Clinical Social Work's list of facilities offering this level of care within the geographic area requested by the patient (or if unable, by the patient's family).  02/11/2013  Patient/family informed of their freedom to choose among providers that offer the needed level of care, that participate in Medicare, Medicaid or managed care program needed by the patient, have an available bed and are willing to accept the patient.    Patient/family informed of [facility]' ownership interest in [facility], as well as of the fact that they are under no obligation to receive care at this facility.  PASARR submitted to EDS on 11/28/2010 PASARR number received from [employer] on 11/28/2010 - [id number]  FL2 transmitted to all facilities in geographic area requested by pt/family on  02/11/2013 FL2 transmitted to all facilities within larger geographic area on   Patient informed that his/her managed care company has contracts with or will negotiate with  certain facilities, including the following:     Patient/family informed of bed offers received:   Patient chooses bed at  Physician recommends and patient chooses bed at    Patient to be transferred to  on   Patient to be transferred to facility by   The following physician request were entered in Epic:   Additional Comments:

## 2013-02-11 NOTE — Evaluation (Signed)
Occupational Therapy Evaluation Patient Details Name: [name] MRN: [id number] DOB: 18-Mar-1922 Today's Date: 02/11/2013 Time: 0815-0910 OT Time Calculation (min): 55 min  OT Assessment / Plan / Recommendation History of present illness 79 year old woman with history of type 2 diabetes mellitus, hypertension, coronary artery disease, MI, diastolic heart failure, GERD, hyperlipidemia, and other problems as outlined in the medical history now admitted following recurrent falls at home.  Patient is not sure why she fell; she denies presyncope/syncope, focal weakness, chest pain, palpitations, or other associated symptoms; she does report some past episodes of dizziness but these did not appear to be related to her recent falls.  Patient is [facility] in the [facility] and lives alone; she uses a walker to ambulate.   Clinical Impression   Pt overall at a min assist level for selfcare tasks and ADLs.  Feel she will benefit from acute care OT to help increase overall independence with selfcare tasks.  Pt with history of falls and needs 24 hour supervision for safety, which pt does not have.  She will benefit from SNF for follow-up therapy but feel she likely will not agree to this based on discussions.  Will continue to follow.    OT Assessment  Patient needs continued OT Services    Follow Up Recommendations  SNF;Supervision/Assistance - 24 hour    Barriers to Discharge Decreased caregiver support    Equipment Recommendations  3 in 1 bedside comode       Frequency  Min 2X/week    Precautions / Restrictions Precautions Precautions: Fall Restrictions Weight Bearing Restrictions: No   Pertinent Vitals/Pain Minimal pain in the right shoulder    ADL  Eating/Feeding: Performed;Independent Where Assessed - Eating/Feeding: Chair Grooming: Performed;Wash/dry hands;Wash/dry face;Min guard Where Assessed - Grooming: Supported standing Upper Body Bathing: Simulated;Set up Where Assessed  - Upper Body Bathing: Unsupported sitting Lower Body Bathing: Minimal assistance Where Assessed - Lower Body Bathing: Supported sit to stand Upper Body Dressing: Simulated;Set up Where Assessed - Upper Body Dressing: Unsupported sitting Lower Body Dressing: Performed;Minimal assistance Where Assessed - Lower Body Dressing: Supported sit to stand Toilet Transfer: Simulated;Minimal assistance Toilet Transfer Method: Other (comment) (ambulate with RW) Acupuncturist: Comfort height toilet Toileting - Clothing Manipulation and Hygiene: Simulated;Minimal assistance Where Assessed - Engineer, mining and Hygiene: Sit to stand from 3-in-1 or toilet Tub/Shower Transfer Method: Not assessed Equipment Used: Rolling walker Transfers/Ambulation Related to ADLs: Pt currently min assist to min guard assist for mobility using the RW. ADL Comments: Pt needing min assist to transfer from supine to sit EOB.  Demonstrates decreased ability to perform inital sit to stand.  Once standing moves very slowly with mobility, and pt reports this is slower than she did at home.    OT Diagnosis: Generalized weakness  OT Problem List: Decreased strength;Decreased range of motion;Impaired balance (sitting and/or standing);Decreased safety awareness;Decreased knowledge of use of DME or AE OT Treatment Interventions: Self-care/ADL training;Therapeutic activities;DME and/or AE instruction;Patient/family education;Balance training   OT Goals(Current goals can be found in the care plan section) Acute Rehab OT Goals Patient Stated Goal: To go back home OT Goal Formulation: With patient Time For Goal Achievement: 02/25/13 Potential to Achieve Goals: Good  Visit Information  Last OT Received On: 02/11/13 Assistance Needed: +1 History of Present Illness: 79 year old woman with history of type 2 diabetes mellitus, hypertension, coronary artery disease, MI, diastolic heart failure, GERD,  hyperlipidemia, and other problems as outlined in the medical history now admitted following recurrent falls  at home.  Patient is not sure why she fell; she denies presyncope/syncope, focal weakness, chest pain, palpitations, or other associated symptoms; she does report some past episodes of dizziness but these did not appear to be related to her recent falls.  Patient is followed in the [facility] and lives alone; she uses a walker to ambulate.       Prior Functioning     Home Living Family/patient expects to be discharged to:: Private residence Living Arrangements: Alone Available Help at Discharge: Personal care attendant;Other (Comment) (aide 3x/week) Type of Home: House Home Access: Stairs to enter [employer] of Steps: 1 Entrance Stairs-Rails: None Home Layout: One level Home Equipment: Other (comment);Hospital bed;Shower seat (3 wheeled walker per pt report) Additional Comments: Pt also has life-line. Prior Function Level of Independence: Independent with assistive device(s) Communication Communication: No difficulties Dominant Hand: Right         Vision/Perception Vision - History Baseline Vision: Other (comment) (Pt bline in the left eye but able to see some with the right) Patient Visual Report: No change from baseline Vision - Assessment Eye Alignment: Within Functional Limits Vision Assessment: Vision not tested Perception Perception: Within Functional Limits Praxis Praxis: Intact   Cognition  Cognition Arousal/Alertness: Awake/alert Behavior During Therapy: WFL for tasks assessed/performed Overall Cognitive Status: Within Functional Limits for tasks assessed    Extremity/Trunk Assessment Upper Extremity Assessment Upper Extremity Assessment: RUE deficits/detail;LUE deficits/detail RUE Deficits / Details: Pt with shoulder flexion 0-110 degrees, all other joints AROM WFLs, shoulder strength 3/5, all other joints 4/5. RUE Coordination:  decreased fine motor LUE Deficits / Details: Pt with shoulder flexion 0-110 degrees, all other joints AROM WFLs, shoulder strength 3/5, all other joints 4/5. LUE Coordination: decreased fine motor Lower Extremity Assessment Lower Extremity Assessment: Defer to PT evaluation Cervical / Trunk Assessment Cervical / Trunk Assessment: Normal     Mobility Bed Mobility Bed Mobility: Rolling Right;Right Sidelying to Sit Rolling Right: 4: Min assist;With rail Right Sidelying to Sit: 4: Min assist;HOB elevated Details for Bed Mobility Assistance: Pt needed assist to transition from sidelying to sitting. Transfers Transfers: Sit to Stand Sit to Stand: 4: Min assist;With upper extremity assist;From bed Stand to Sit: 4: Min assist;Without upper extremity assist;To chair/3-in-1        Balance Balance Balance Assessed: Yes Static Standing Balance Static Standing - Balance Support: No upper extremity supported Static Standing - Level of Assistance: 5: Stand by assistance;Other (comment) (min guard assist while standing at the sink)   End of Session OT - End of Session Equipment Utilized During Treatment: Rolling walker Activity Tolerance: Patient tolerated treatment well Patient left: in chair;with call bell/phone within reach;with nursing/sitter in room Nurse Communication: Mobility status     Orlo Brickle OTR/L Pager number [phone] 02/11/2013, 9:22 AM

## 2013-02-11 NOTE — Clinical Social Work Psychosocial (Signed)
Clinical Social Work Department BRIEF PSYCHOSOCIAL ASSESSMENT 02/11/2013  Patient:  [name], [name]     Account Number:  [id number]     Admit date:  02/09/2013  Clinical Social Worker:  Delmer Islam  Date/Time:  02/11/2013 12:56 PM  Referred by:  Physician  Date Referred:  02/11/2013 Referred for  SNF Placement   Other Referral:   Interview type:  Patient Other interview type:   CSW also spoke by phone with daughter Harriett Sine Via    PSYCHOSOCIAL DATA Living Status:  ALONE Admitted from facility:   Level of care:   Primary support name:   Primary support relationship to patient:  CHILD, ADULT Degree of support available:   Ms. Via [phone]) is the POA. Per daughter, she has an older sister who lives in [place] and has some health issues.    CURRENT CONCERNS Current Concerns  Post-Acute Placement   Other Concerns:    SOCIAL WORK ASSESSMENT / PLAN CSW and RN case manager Darlyne Russian talked with patient about discharge plans. Patient was alert/oriented and very talkative. Ms. Heminger reported that she does go to [facility] a few times a week and has help in the home, but only for a few hours in the afternoon. Patient stated that she needs more help during the day and on the weekends as she currently receives no aide assistance during these times. Initially patient adamant about going home at discharge, but as we continued to talk with patient she reports that her arm/shoulder is very painful and she is unable to do some things because of it. This was discussed in-depth and patient then became agreeable to short-term rehab at [facility]; as she is aware that [facility] uses this facility and she has been there before for rehab. Patient gave CSW permission to contact her daughter.    CSW spoke by phone with Ms. Via and updated her on conversation with patient and her agreement to go to [facility] for ST rehab. Daughter is fine with this decision and advised CSW that she will be out of town on  business on Thursday, 8/28. CSW informed daughter that she will be contacted when patient ready for discharge.   Assessment/plan status:  Psychosocial Support/Ongoing Assessment of Needs Other assessment/ plan:   Information/referral to community resources:    PATIENT'S/FAMILY'S RESPONSE TO PLAN OF CARE: Patient and daughter very pleasant and receptive to talking with CSW regarding discharge plans. Patient agreeable to ST rehab and daughter fine with this plan.

## 2013-02-11 NOTE — Progress Notes (Signed)
Internal Medicine Attending  Date: 02/11/2013  Patient name: [name] Medical record number: [id number] Date of birth: 07/08/1921 Age: 79 y.o. Gender: female  I saw and evaluated the patient on a.m. rounds with house staff. I reviewed the resident's note by Dr. Andrey Campanile and I agree with the resident's findings and plans as documented in her note, with the following additional comments.  I think we can discontinue IV fluids since patient is taking PO well and appears volume replete.

## 2013-02-11 NOTE — Progress Notes (Signed)
Subjective: [name] had an episode of hypotension last night (85/53) but was asymptomatic.  She had three telemetry alarms for non-sustained VT (about 1-1.5 seconds) during the early morning.  [name] was seen and examined by me this morning.  At the time, she was being evaluated by OT.  She denies CP/SOB, lightheadedness or dizziness.  She has been opposed to the idea of going to a SNF in the past but today she says she may be willing to try a short stay in SNF for further rehab.   Objective: Vital signs in last 24 hours: Filed Vitals:   02/10/13 2043 02/10/13 2045 02/10/13 2048 02/11/13 0607  BP: 108/81 107/66 111/61 149/78  Pulse: 80 85 89 93  Temp: 98.3 F (36.8 C)   98 F (36.7 C)  TempSrc: Oral   Oral  Resp: 20 20 20 20   Height: 5\' 1"  (1.549 m)     Weight: 71.215 kg (157 lb)     SpO2: 96% 95% 94% 93%   Weight change: 0 kg (0 lb)  Intake/Output Summary (Last 24 hours) at 02/11/13 0849 Last data filed at 02/11/13 0600  Gross per 24 hour  Intake   2480 ml  Output    100 ml  Net   2380 ml   General: sitting up in a chair, in NAD HEENT: Mucous membranes moist Cardiac: RRR, no rubs, murmurs or gallops Pulm: clear to auscultation bilaterally, moving normal volumes of air Abd: soft, nontender, nondistended, BS present Ext: warm and well perfused, no pedal edema Neuro: alert and oriented X3  Lab Results: Basic Metabolic Panel:  Recent Labs Lab 02/10/13 0545 02/11/13 0605  NA 142 141  K 4.8 4.0  CL 103 106  CO2 33* 27  GLUCOSE 214* 188*  BUN 22 23  CREATININE 1.16* 1.10  CALCIUM 9.4 8.7   Liver Function Tests:  Recent Labs Lab 02/10/13 0545  AST 19  ALT 15  ALKPHOS 99  BILITOT 0.3  PROT 6.7  ALBUMIN 3.2*   CBC:  Recent Labs Lab 02/09/13 1100 02/11/13 0605  WBC 8.4 6.1  NEUTROABS 6.3 4.1  HGB 13.0 12.7  HCT 40.0 40.4  MCV 95.9 96.4  PLT 142* 126*   Cardiac Enzymes:  Recent Labs Lab 02/09/13 1100 02/10/13 0555  TROPONINI <0.30 <0.30     BNP:  Recent Labs Lab 02/09/13 1800  PROBNP 318.7   CBG:  Recent Labs Lab 02/09/13 2044 02/10/13 1147 02/10/13 1650 02/10/13 2059 02/11/13 0832  GLUCAP 205* 282* 265* 116* 172*   Hemoglobin A1C:  Recent Labs Lab 02/09/13 1702  HGBA1C 9.3*   Fasting Lipid Panel:  Recent Labs Lab 02/10/13 0545  CHOL 207*  HDL 49  LDLCALC 131*  TRIG 135  CHOLHDL 4.2   Coagulation:  Recent Labs Lab 02/10/13 0545  LABPROT 12.5  INR 0.95   Urinalysis:  Recent Labs Lab 02/09/13 1050  COLORURINE YELLOW  LABSPEC 1.015  PHURINE 7.5  GLUCOSEU 250*  HGBUR MODERATE*  BILIRUBINUR NEGATIVE  KETONESUR NEGATIVE  PROTEINUR NEGATIVE  UROBILINOGEN 1.0  NITRITE NEGATIVE  LEUKOCYTESUR TRACE*    Micro Results: Recent Results (from the past 240 hour(s))  URINE CULTURE     Status: None   Collection Time    02/09/13 10:50 AM      Result Value Range Status   Specimen Description URINE, RANDOM   Final   Special Requests NONE   Final   Culture  Setup Time     Final  Value: 02/09/2013 15:11     Performed at [facility] Count     Final   Value: >=100,000 COLONIES/ML     Performed at [facility]   Culture     Final   Value: ESCHERICHIA COLI     Performed at [facility]   Report Status 02/11/2013 FINAL   Final   Organism ID, Bacteria ESCHERICHIA COLI   Final   Studies/Results: Dg Chest 2 View  02/09/2013   CLINICAL DATA:  Confusion. Recent fall.  EXAM: CHEST  2 VIEW  COMPARISON:  09/28/2012  FINDINGS: Heart size remains within normal limits. Left lung scarring is stable. No evidence of acute infiltrate or pulmonary edema. No evidence of pleural effusion. No mass or lymphadenopathy identified.  IMPRESSION: Stable left lung scarring. No active disease.   Electronically Signed   By: Myles Rosenthal   On: 02/09/2013 12:02   Ct Head Wo Contrast  02/09/2013   *RADIOLOGY REPORT*  Clinical Data: Trauma from a fall.  CT HEAD WITHOUT CONTRAST   Technique:  Contiguous axial images were obtained from the base of the skull through the vertex without contrast.  Comparison: Head CT 09/28/2012.  Findings: Mild cerebral and cerebellar atrophy.  Extensive patchy and confluent areas of decreased attenuation throughout the deep and periventricular white matter of the cerebral hemispheres bilaterally, compatible with advanced chronic microvascular ischemic disease. No acute displaced skull fractures are identified.  No acute intracranial abnormality.  Specifically, no evidence of acute post-traumatic intracranial hemorrhage, no definite regions of acute/subacute cerebral ischemia, no focal mass, mass effect, hydrocephalus or abnormal intra or extra-axial fluid collections.  The visualized paranasal sinuses and mastoids are well pneumatized.  IMPRESSION: 1.  No acute displaced skull fractures or acute post-traumatic intracranial abnormalities. 2.  Mild cerebral and cerebellar atrophy with extensive chronic microvascular ischemic changes throughout the cerebral white matter, as above.   Original Report Authenticated By: Trudie Reed, M.D.   Medications: I have reviewed the patient's current medications. Scheduled Meds: . aspirin EC  81 mg Oral Daily  . docusate sodium  100 mg Oral Daily  . heparin  5,000 Units Subcutaneous Q8H  . hydrocerin   Topical BID  . insulin aspart  0-9 Units Subcutaneous TID WC  . isosorbide mononitrate  60 mg Oral Daily  . metoprolol tartrate  25 mg Oral Daily  . pantoprazole  40 mg Oral QAC breakfast  . senna  1 tablet Oral QHS  . sertraline  100 mg Oral Daily  . sodium chloride  3 mL Intravenous Q12H  . [START ON 02/13/2013] Vitamin D (Ergocalciferol)  50,000 Units Oral Q Fri   Continuous Infusions: . sodium chloride 100 mL/hr at 02/10/13 2330   PRN Meds:.sodium chloride, acetaminophen, guaifenesin, nitroGLYCERIN, sodium chloride  Assessment/Plan: 79 year old female with DM type 2, HTN, CAD and MI, macular  degeneration, vertigo and frequent falls.   #Fall - Unclear etiology. Likely orthostatic 2/2 to dehydration. BUN/Cr ratio > 20 consistent with pre-renal state. + orthostatic vitals.  Differential includes cardiac or neurologic etiology, however troponin is negative, no ishcemic changes on EKG and she denies CP/palpitations/pre-syncope or syncope making cardiac cause less likely.  Also, CT head negative, no weakness/numbness or focal deficits making stroke unlikely.  [name] is a [facility] participant and appreciate Dr. Malon Kindle recommendations; Gabapentin was recently started for peripheral neuropathy and may have contributed to fall; will d/c this med and others that may increase her fall risk. - continue  to hold Lasix and miralax - continue IV NS 100cc/hr  - PT/OT eval and recommendations appreciated; recommend SNF/24 hour assistance - Will speak with Dr. Dorothe Pea and patient's daughter regarding short-term SNF stay vs. 24 hour assistance in home (i.e. Living with a family member).    #CAD - stable, no chest pain  - continue telemetry monitoring - troponin negative  - continue 81mg  ASA, metoprolol   #DM type 2 - Hgb A1c 9.3; on glimepiride 4mg  at home   - hold glimepiride  - SSI   #CKD3 - Initial Cr 1.32; Cr. 1.10 today; prior values have ranged 1.00-1.37, most likely elevated due to pre-renal state.  - continue NS at 100cc/hr  Dispo: Disposition is deferred at this time, awaiting improvement of current medical problems.  Anticipated discharge in approximately 1 day(s).   The patient does not have a current PCP (No primary provider on file.) and does need an [facility] hospital follow-up appointment after discharge.  The patient does not know have transportation limitations that hinder transportation to clinic appointments.  .Services Needed at time of discharge: Y = Yes, Blank = No PT:   OT:   RN:   Equipment:   Other:     LOS: 2 days   Evelena Peat, DO 02/11/2013, 8:49 AM

## 2013-02-12 LAB — GLUCOSE, CAPILLARY
Glucose-Capillary: 165 mg/dL — ABNORMAL HIGH (ref 70–99)
Glucose-Capillary: 189 mg/dL — ABNORMAL HIGH (ref 70–99)

## 2013-02-12 LAB — CBC WITH DIFFERENTIAL/PLATELET
Basophils Absolute: 0 10*3/uL (ref 0.0–0.1)
Basophils Relative: 0 % (ref 0–1)
MCHC: 32.4 g/dL (ref 30.0–36.0)
Neutro Abs: 4.9 10*3/uL (ref 1.7–7.7)
Neutrophils Relative %: 73 % (ref 43–77)
RDW: 15.1 % (ref 11.5–15.5)
WBC: 6.7 10*3/uL (ref 4.0–10.5)

## 2013-02-12 LAB — BASIC METABOLIC PANEL
Chloride: 104 mEq/L (ref 96–112)
Creatinine, Ser: 0.98 mg/dL (ref 0.50–1.10)
GFR calc Af Amer: 58 mL/min — ABNORMAL LOW (ref >90)
Potassium: 4.4 mEq/L (ref 3.5–5.1)
Sodium: 139 mEq/L (ref 135–145)

## 2013-02-12 MED ORDER — LEVOFLOXACIN 250 MG PO TABS: 250.0000 mg | ORAL_TABLET | Freq: Every day | ORAL | Status: DC

## 2013-02-12 NOTE — Progress Notes (Signed)
Physical Therapy Treatment Patient Details Name: [name] MRN: [id number] DOB: 1922/02/16 Today's Date: 02/12/2013 Time: 1028-1100 PT Time Calculation (min): 32 min  PT Assessment / Plan / Recommendation  History of Present Illness 79 year old woman with history of type 2 diabetes mellitus, hypertension, coronary artery disease, MI, diastolic heart failure, GERD, hyperlipidemia, and other problems as outlined in the medical history now admitted following recurrent falls at home.  Patient is not sure why she fell; she denies presyncope/syncope, focal weakness, chest pain, palpitations, or other associated symptoms; she does report some past episodes of dizziness but these did not appear to be related to her recent falls.  [facility] in the [facility] and lives alone; she uses a walker to ambulate.   PT Comments   Pt assisted with donning shoes and then to bathroom (also changed brief) prior to ambulating in hallway.    Follow Up Recommendations  SNF;Supervision/Assistance - 24 hour     Does the patient have the potential to tolerate intense rehabilitation     Barriers to Discharge        Equipment Recommendations  Rolling walker with 5" wheels    Recommendations for Other Services    Frequency     Progress towards PT Goals Progress towards PT goals: Progressing toward goals  Plan Current plan remains appropriate    Precautions / Restrictions Precautions Precautions: Fall   Pertinent Vitals/Pain n/a    Mobility  Transfers Transfers: Sit to Stand;Stand to Sit Sit to Stand: With upper extremity assist;From toilet;4: Min assist;From chair/3-in-1 Stand to Sit: With upper extremity assist;To chair/3-in-1;To toilet;4: Min guard Details for Transfer Assistance: verbal cues for hand placement, assist to rise from toilet Ambulation/Gait Ambulation/Gait Assistance: 4: Min guard Ambulation Distance (Feet): 350 Feet Assistive device: Rolling walker Ambulation/Gait  Assistance Details: very slow pace, had pt perform weaving around floor tiles for 40 feet, verbal cues for RW distance Gait Pattern: Step-through pattern;Decreased stride length;Trunk flexed Gait velocity: decreased    Exercises     PT Diagnosis:    PT Problem List:   PT Treatment Interventions:     PT Goals (current goals can now be found in the care plan section)    Visit Information  Last PT Received On: 02/12/13 Assistance Needed: +1 History of Present Illness: 79 year old woman with history of type 2 diabetes mellitus, hypertension, coronary artery disease, MI, diastolic heart failure, GERD, hyperlipidemia, and other problems as outlined in the medical history now admitted following recurrent falls at home.  Patient is not sure why she fell; she denies presyncope/syncope, focal weakness, chest pain, palpitations, or other associated symptoms; she does report some past episodes of dizziness but these did not appear to be related to her recent falls.  Patient is followed in the [facility] and lives alone; she uses a walker to ambulate.    Subjective Data      Cognition  Cognition Arousal/Alertness: Awake/alert Behavior During Therapy: WFL for tasks assessed/performed Overall Cognitive Status: Within Functional Limits for tasks assessed    Balance     End of Session PT - End of Session Equipment Utilized During Treatment: Gait belt Activity Tolerance: Patient tolerated treatment well Patient left: in chair;with call bell/phone within reach;with chair alarm set   GP     [name],[name] 02/12/2013, 1:27 PM Zenovia Jarred, PT, DPT 02/12/2013 Pager: [phone]

## 2013-02-12 NOTE — Progress Notes (Signed)
Internal Medicine Attending  Date: 02/12/2013  Patient name: [name] Medical record number: [id number] Date of birth: 04-09-1922 Age: 79 y.o. Gender: female  I saw and evaluated the patient in AM, and I discussed her care with house staff. I reviewed the resident's note by Dr. Andrey Campanile and I agree with the resident's findings and plans as documented in her note, with the following additional comments.  Urinalysis from 02/09/2013 is growing a pansensitive Escherichia coli; agree with plan to treat with a short course of oral antibiotic.  Anticipate discharge to rehabilitation facility today.  Dr. Josem Kaufmann will take over as attending physician on our service tomorrow 02/13/2013.

## 2013-02-12 NOTE — Progress Notes (Signed)
Subjective:   [name] was seen and examined by me this morning.  She is feeling well and denies CP/SOB or other complaints.  The patient spoke with social work yesterday and is agreeable to short term placement in a SNF.  Objective: Vital signs in last 24 hours: Filed Vitals:   02/11/13 1406 02/11/13 1533 02/11/13 2048 02/12/13 0618  BP: 142/81 89/47 130/65 151/72  Pulse: 87 80 85 86  Temp: 98 F (36.7 C) 97.5 F (36.4 C) 98.4 F (36.9 C) 98.6 F (37 C)  TempSrc: Oral Oral Oral Oral  Resp: 20 18 18 18   Height:   5\' 1"  (1.549 m)   Weight:   71.215 kg (157 lb)   SpO2: 94% 92% 96% 93%   Weight change: 0 kg (0 lb)  Intake/Output Summary (Last 24 hours) at 02/12/13 7829 Last data filed at 02/12/13 5621  Gross per 24 hour  Intake    600 ml  Output    200 ml  Net    400 ml   General: sitting up in a chair, in NAD HEENT: Mucous membranes moist Cardiac: RRR, no rubs, murmurs or gallops Pulm: clear to auscultation bilaterally, moving normal volumes of air Abd: soft, nontender, nondistended, BS present Neuro: alert and oriented X3  Lab Results: Basic Metabolic Panel:  Recent Labs Lab 02/10/13 0545 02/11/13 0605  NA 142 141  K 4.8 4.0  CL 103 106  CO2 33* 27  GLUCOSE 214* 188*  BUN 22 23  CREATININE 1.16* 1.10  CALCIUM 9.4 8.7   Liver Function Tests:  Recent Labs Lab 02/10/13 0545  AST 19  ALT 15  ALKPHOS 99  BILITOT 0.3  PROT 6.7  ALBUMIN 3.2*   CBC:  Recent Labs Lab 02/09/13 1100 02/11/13 0605  WBC 8.4 6.1  NEUTROABS 6.3 4.1  HGB 13.0 12.7  HCT 40.0 40.4  MCV 95.9 96.4  PLT 142* 126*   Cardiac Enzymes:  Recent Labs Lab 02/09/13 1100 02/10/13 0555  TROPONINI <0.30 <0.30   BNP:  Recent Labs Lab 02/09/13 1800  PROBNP 318.7   CBG:  Recent Labs Lab 02/10/13 2059 02/11/13 0832 02/11/13 1120 02/11/13 1648 02/11/13 2055 02/12/13 0720  GLUCAP 116* 172* 241* 156* 192* 165*   Hemoglobin A1C:  Recent Labs Lab 02/09/13 1702    HGBA1C 9.3*   Fasting Lipid Panel:  Recent Labs Lab 02/10/13 0545  CHOL 207*  HDL 49  LDLCALC 131*  TRIG 135  CHOLHDL 4.2   Coagulation:  Recent Labs Lab 02/10/13 0545  LABPROT 12.5  INR 0.95   Urinalysis:  Recent Labs Lab 02/09/13 1050  COLORURINE YELLOW  LABSPEC 1.015  PHURINE 7.5  GLUCOSEU 250*  HGBUR MODERATE*  BILIRUBINUR NEGATIVE  KETONESUR NEGATIVE  PROTEINUR NEGATIVE  UROBILINOGEN 1.0  NITRITE NEGATIVE  LEUKOCYTESUR TRACE*    Micro Results: Recent Results (from the past 240 hour(s))  URINE CULTURE     Status: None   Collection Time    02/09/13 10:50 AM      Result Value Range Status   Specimen Description URINE, RANDOM   Final   Special Requests NONE   Final   Culture  Setup Time     Final   Value: 02/09/2013 15:11     Performed at [facility] Count     Final   Value: >=100,000 COLONIES/ML     Performed at [facility]   Culture     Final   Value: ESCHERICHIA  COLI     Performed at [facility]   Report Status 02/11/2013 FINAL   Final   Organism ID, Bacteria ESCHERICHIA COLI   Final   Studies/Results: No results found. Medications: I have reviewed the patient's current medications. Scheduled Meds: . aspirin EC  81 mg Oral Daily  . docusate sodium  100 mg Oral Daily  . heparin  5,000 Units Subcutaneous Q8H  . hydrocerin   Topical BID  . insulin aspart  0-9 Units Subcutaneous TID WC  . isosorbide mononitrate  60 mg Oral Daily  . metoprolol tartrate  25 mg Oral Daily  . pantoprazole  40 mg Oral QAC breakfast  . senna  1 tablet Oral QHS  . sertraline  100 mg Oral Daily  . sodium chloride  3 mL Intravenous Q12H  . [START ON 02/13/2013] Vitamin D (Ergocalciferol)  50,000 Units Oral Q Fri   Continuous Infusions:  None   PRN Meds:.sodium chloride, acetaminophen, guaifenesin, nitroGLYCERIN, sodium chloride  Assessment/Plan: 79 year old female with DM type 2, HTN, CAD and MI, macular degeneration, vertigo  and frequent falls.   #Fall - Unclear etiology. Likely orthostatic 2/2 to dehydration. BUN/Cr ratio > 20 consistent with pre-renal state. + orthostatic vitals.  Differential includes cardiac or neurologic etiology, however troponin is negative, no ishcemic changes on EKG and she denies CP/palpitations/pre-syncope or syncope making cardiac cause less likely.  Also, CT head negative, no weakness/numbness or focal deficits making stroke unlikely.  [name] is a [facility] participant and appreciate Dr. Malon Kindle recommendations; Gabapentin was recently started for peripheral neuropathy and may have contributed to fall; will d/c this med and others that may increase her fall risk. - PT/OT eval and recommendations appreciated; recommend SNF/24 hour assistance - Plan for patient discharge to SNF today    #CAD - stable, no chest pain  - troponin negative  - continue 81mg  ASA, metoprolol   #DM type 2 - Hgb A1c 9.3; on glimepiride 4mg  at home   - SSI  - resume glimepiride at discharge  #CKD3 - Imrproving 1.32-->1.16-->1.10; prior values have ranged 1.00-1.37, most likely elevated due to pre-renal state.   Dispo: The patient is feeling well but with her fall history and episodes of dizziness she will require 24 hour supervision to help prevent future falls.  The patient is resistant to long-term SNF, but has agreed to short tem SNF for rehab.  Plan is for discharge to SNF today for short-term stay.  The patient does not have a current PCP (No primary provider on file.) and does need an [facility] hospital follow-up appointment after discharge.  The patient does not know have transportation limitations that hinder transportation to clinic appointments.  .Services Needed at time of discharge: Y = Yes, Blank = No PT:   OT:   RN:   Equipment:   Other:     LOS: 3 days   Evelena Peat, DO 02/12/2013, 8:21 AM

## 2013-02-12 NOTE — Progress Notes (Signed)
Report given to receiving RN at facility.  All questions answered to RN's satisfaction.  IV is out.  [facility].

## 2013-02-12 NOTE — Discharge Summary (Signed)
Patient Name:  [name]  MRN: [id number]  PCP: No primary provider on file.  DOB:  20-Jan-1922       Date of Admission:  02/09/2013  Date of Discharge:  02/12/2013      Attending Physician: Dr. Farley Ly, MD         DISCHARGE DIAGNOSES: 1.   Fall 2.   DIABETES MELLITUS, II, COMPLICATIONS 3.   DEPRESSION, MAJOR, RECURRENT 4.   Essential hypertension, benign 5.   CORONARY, ARTERIOSCLEROSIS 6.   CLAUDICATION, INTERMITTENT 7.   GASTROESOPHAGEAL REFLUX, NO ESOPHAGITIS 8.   VERTIGO   DISPOSITION AND FOLLOW-UP: [name] is being discharged to SNF.  The patient is a participant in the [facility] so her discharge from SNF to home will need to be coordinated with [facility] and Dr. [name].  [facility] [place] [phone].  DISCHARGE MEDICATIONS:   Medication List    ASK your doctor about these medications       acetaminophen 325 MG tablet  Commonly known as:  TYLENOL  Take 650 mg by mouth every 6 (six) hours as needed. Pain     aspirin 81 MG EC tablet  Take 81 mg by mouth daily.     docusate sodium 100 MG capsule  Commonly known as:  COLACE  Take 100 mg by mouth daily.     ergocalciferol 50000 UNITS capsule  Commonly known as:  VITAMIN D2  Take 50,000 Units by mouth once a week.     eucerin cream  Apply 1 application topically 2 (two) times daily.     furosemide 20 MG tablet  Commonly known as:  LASIX  Take 20 mg by mouth daily.     gabapentin 300 MG capsule  Commonly known as:  NEURONTIN  Take 300 mg by mouth at bedtime.     glimepiride 4 MG tablet  Commonly known as:  AMARYL  Take 4 mg by mouth daily before breakfast.     guaiFENesin 100 MG/5ML liquid  Commonly known as:  ROBITUSSIN  Take 100 mg by mouth every 4 (four) hours as needed for cough or congestion.     isosorbide mononitrate 60 MG 24 hr tablet  Commonly known as:  IMDUR  Take 60 mg by mouth daily.     metoprolol tartrate 25 MG tablet  Commonly known as:   LOPRESSOR  Take 25 mg by mouth daily.     nitroGLYCERIN 0.4 MG SL tablet  Commonly known as:  NITROSTAT  Place 0.4 mg under the tongue every 5 (five) minutes as needed for chest pain.     pantoprazole 40 MG tablet  Commonly known as:  PROTONIX  Take 40 mg by mouth daily.     polyethylene glycol packet  Commonly known as:  MIRALAX / GLYCOLAX  Take 17 g by mouth 2 (two) times daily.     senna 8.6 MG Tabs tablet  Commonly known as:  SENOKOT  Take 1 tablet by mouth at bedtime. Sunday     sertraline 100 MG tablet  Commonly known as:  ZOLOFT  Take 100 mg by mouth daily.        CONSULTS:   PT/OT   PROCEDURES PERFORMED:  Dg Chest 2 View  02/09/2013   CLINICAL DATA:  Confusion. Recent fall.  EXAM: CHEST  2 VIEW  COMPARISON:  09/28/2012  FINDINGS: Heart size remains within normal limits. Left lung scarring is stable. No evidence of acute infiltrate or pulmonary edema. No  evidence of pleural effusion. No mass or lymphadenopathy identified.  IMPRESSION: Stable left lung scarring. No active disease.   Electronically Signed   By: Myles Rosenthal   On: 02/09/2013 12:02   Ct Head Wo Contrast  02/09/2013   *RADIOLOGY REPORT*  Clinical Data: Trauma from a fall.  CT HEAD WITHOUT CONTRAST  Technique:  Contiguous axial images were obtained from the base of the skull through the vertex without contrast.  Comparison: Head CT 09/28/2012.  Findings: Mild cerebral and cerebellar atrophy.  Extensive patchy and confluent areas of decreased attenuation throughout the deep and periventricular white matter of the cerebral hemispheres bilaterally, compatible with advanced chronic microvascular ischemic disease. No acute displaced skull fractures are identified.  No acute intracranial abnormality.  Specifically, no evidence of acute post-traumatic intracranial hemorrhage, no definite regions of acute/subacute cerebral ischemia, no focal mass, mass effect, hydrocephalus or abnormal intra or extra-axial fluid  collections.  The visualized paranasal sinuses and mastoids are well pneumatized.  IMPRESSION: 1.  No acute displaced skull fractures or acute post-traumatic intracranial abnormalities. 2.  Mild cerebral and cerebellar atrophy with extensive chronic microvascular ischemic changes throughout the cerebral white matter, as above.   Original Report Authenticated By: Trudie Reed, M.D.      ADMISSION DATA: H&P: [name] is and 79 year old woman with a PMH of DM type 2, HTN, CAD and MI, macular degeneration, vertigo and frequent falls. She presents to the ED today after two falls this weekend. She says she took a medication that made her "feel drunk." The first fall occurred early on Saturday morning when she got up to go to the bathroom. She denies CP/palpitations/presyncope of syncope. She denies tripping on anything or hitting her head. She is not on anticoagulants. The second fall occurred late last night. She got up from watching tv and fell onto the carpet. She said she attempted to grab the back of her living room chair as she fell. After this second fall she pressed her LifeAlert button and EMS came to take her to the ED.  She says she has been falling for years. She lives alone and uses a walker to ambulate. An aide comes to the home several times per week.  Physical Exam: Blood pressure 159/78, pulse 90, temperature 98 F (36.7 C), temperature source Oral, resp. rate 18, height 5\' 1"  (1.549 m), weight 71.215 kg (157 lb), SpO2 90.00%.  General: resting in bed in NAD  HEENT: PERRL, EOMI, no scleral icterus, R cataract  Cardiac: Distant heart sounds, RRR, no rubs, murmurs or gallops  Pulm: clear to auscultation bilaterally, moving normal volumes of air  Abd: soft, nondistended, BS present, +mildly tender lower abdomen  Ext: warm and well perfused, no pedal edema  Neuro: alert and oriented X3, cranial nerves II-XII grossly intact, 5/5 MMS, negative Babinski  Labs: Basic Metabolic Panel:    Recent Labs   02/09/13 1100  02/10/13 0545   NA  141  142   K  4.4  4.8   CL  99  103   CO2  33*  33*   GLUCOSE  286*  214*   BUN  30*  22   CREATININE  1.32*  1.16*   CALCIUM  9.5  9.4    Liver Function Tests:   Recent Labs   02/10/13 0545   AST  19   ALT  15   ALKPHOS  99   BILITOT  0.3   PROT  6.7   ALBUMIN  3.2*    CBC:   Recent Labs   02/09/13 1100   WBC  8.4   HGB  13.0   HCT  40.0   MCV  95.9   PLT  142*     Recent Labs   02/09/13 1100   NEUTROABS  6.3   LYMPHSABS  1.3   MONOABS  0.6   EOSABS  0.2   BASOSABS  0.0    Cardiac Enzymes:   Recent Labs   02/09/13 1100  02/10/13 0555   TROPONINI  <0.30  <0.30    BNP:   Recent Labs   02/09/13 1800   PROBNP  318.7    CBG:   Recent Labs   02/09/13 2044   GLUCAP  205*    Hemoglobin A1C:   Recent Labs   02/09/13 1702   HGBA1C  9.3*    Fasting Lipid Panel:   Recent Labs   02/10/13 0545   CHOL  207*   HDL  49   LDLCALC  131*   TRIG  135   CHOLHDL  4.2    Coagulation:   Recent Labs   02/10/13 0545   INR  0.95    Urinalysis    Component  Value  Date/Time    COLORURINE  YELLOW  02/09/2013 1050    APPEARANCEUR  CLOUDY*  02/09/2013 1050    LABSPEC  1.015  02/09/2013 1050    PHURINE  7.5  02/09/2013 1050    GLUCOSEU  250*  02/09/2013 1050    HGBUR  MODERATE*  02/09/2013 1050    HGBUR  negative  03/13/2007 1052    BILIRUBINUR  NEGATIVE  02/09/2013 1050    KETONESUR  NEGATIVE  02/09/2013 1050    PROTEINUR  NEGATIVE  02/09/2013 1050    UROBILINOGEN  1.0  02/09/2013 1050    NITRITE  NEGATIVE  02/09/2013 1050    LEUKOCYTESUR  TRACE*  02/09/2013 1050    Urine microscopic:   Recent Labs   02/09/13 1050   EPIU  FEW*   WBCU  3-6   RBCU  3-6   BACTERIA  MANY*    HOSPITAL COURSE: #Fall -  Cardiac and neurological causes were ruled out.  Her fall was likely multifactorial.  She was orthostatic likely secondary to dehydration and Lasix.  Also, she recently started Gabapentin which  has the potential to cause dizziness and ataxia.  Peripheral neuropathy, poor vision and deconditioning are also potentially playing a role.  We stopped Gabapentin, held Lasix and treated her with IV fluids.  PT and OT evaluated the patient and recommended SNF/24 hour supervision for patient safety and continued therapy for improvement of gait difficulties.  Ms. Ruotolo is a member of the [facility] and has been reluctant to go to SNF in the past, however she agreed to short-term SNF placement for rehab.  The patient did well during her hospital stay and was discharged to SNF in good condition.  The patient is a participant in the [facility] program so her discharge from SNF to home will need to be coordinated with [facility] and Dr. Dorothe Pea.   #CAD - Ms. Ilagan had no chest pain or palpitations during hospitalization.  She was monitored on telemetry and we continued aspirin and metoprolol.  #DM type 2 - Ms. Pestka is on glimepiride 4mg  at home.  Glimepiride was held and she received sliding scale insulin in the hospital with elevated blood sugars (150s-280s).  Her Hgb A1c was 9.3 this admission  which may be due in part to questionable medication compliance.  #AKI on CKD3 - Her creatinine was 1.32 at admission.  Prior values have ranged 1.00-1.37.  This elevation was most likely due to pre-renal state and her creatinine improved to 0.98 with IV fluids.   DISCHARGE DATA: Vital Signs: BP 151/72  Pulse 86  Temp(Src) 98.6 F (37 C) (Oral)  Resp 18  Ht 5\' 1"  (1.549 m)  Wt 71.215 kg (157 lb)  BMI 29.68 kg/m2  SpO2 93%  Labs: Results for orders placed during the hospital encounter of 02/09/13 (from the past 24 hour(s))  GLUCOSE, CAPILLARY     Status: Abnormal   Collection Time    02/11/13 11:20 AM      Result Value Range   Glucose-Capillary 241 (*) 70 - 99 mg/dL  GLUCOSE, CAPILLARY     Status: Abnormal   Collection Time    02/11/13  4:48 PM      Result Value Range   Glucose-Capillary 156 (*) 70 - 99 mg/dL    Comment 1 Notify RN    GLUCOSE, CAPILLARY     Status: Abnormal   Collection Time    02/11/13  8:55 PM      Result Value Range   Glucose-Capillary 192 (*) 70 - 99 mg/dL  GLUCOSE, CAPILLARY     Status: Abnormal   Collection Time    02/12/13  7:20 AM      Result Value Range   Glucose-Capillary 165 (*) 70 - 99 mg/dL     Services Ordered on Discharge: Y = Yes; Blank = No PT:   OT:   RN:   Equipment:   Other:      Time Spent on Discharge: 35 min   Signed: Evelena Peat PGY 1, Internal Medicine Resident 02/12/2013, 8:49 AM

## 2013-04-23 ENCOUNTER — Other Ambulatory Visit: Payer: Self-pay

## 2014-01-15 ENCOUNTER — Inpatient Hospital Stay (HOSPITAL_COMMUNITY)
Admission: EM | Admit: 2014-01-15 | Discharge: 2014-01-20 | DRG: 250 | Disposition: A | Discharge status: Home / Self Care | Payer: Medicare (Managed Care) | Attending: Cardiology | Admitting: Cardiology

## 2014-01-15 ENCOUNTER — Encounter (HOSPITAL_COMMUNITY): Payer: Self-pay | Admitting: Emergency Medicine

## 2014-01-15 ENCOUNTER — Encounter (HOSPITAL_COMMUNITY): Payer: Medicare (Managed Care) | Admitting: Anesthesiology

## 2014-01-15 ENCOUNTER — Encounter (HOSPITAL_COMMUNITY): Payer: Self-pay | Admitting: Anesthesiology

## 2014-01-15 ENCOUNTER — Ambulatory Visit (HOSPITAL_COMMUNITY): Admit: 2014-01-15 | Payer: Self-pay | Admitting: Cardiology

## 2014-01-15 ENCOUNTER — Encounter (HOSPITAL_COMMUNITY)
Admission: EM | Disposition: A | Discharge status: Home / Self Care | Payer: Medicare (Managed Care) | Source: Home / Self Care | Attending: Cardiology

## 2014-01-15 DIAGNOSIS — W19XXXA Unspecified fall, initial encounter: Secondary | ICD-10-CM | POA: Diagnosis present

## 2014-01-15 DIAGNOSIS — H409 Unspecified glaucoma: Secondary | ICD-10-CM | POA: Diagnosis present

## 2014-01-15 DIAGNOSIS — Z888 Allergy status to other drugs, medicaments and biological substances status: Secondary | ICD-10-CM | POA: Diagnosis not present

## 2014-01-15 DIAGNOSIS — F3289 Other specified depressive episodes: Secondary | ICD-10-CM | POA: Diagnosis present

## 2014-01-15 DIAGNOSIS — J4489 Other specified chronic obstructive pulmonary disease: Secondary | ICD-10-CM | POA: Diagnosis present

## 2014-01-15 DIAGNOSIS — I2582 Chronic total occlusion of coronary artery: Secondary | ICD-10-CM | POA: Diagnosis present

## 2014-01-15 DIAGNOSIS — N183 Chronic kidney disease, stage 3 unspecified: Secondary | ICD-10-CM | POA: Diagnosis present

## 2014-01-15 DIAGNOSIS — I10 Essential (primary) hypertension: Secondary | ICD-10-CM

## 2014-01-15 DIAGNOSIS — F0390 Unspecified dementia without behavioral disturbance: Secondary | ICD-10-CM | POA: Diagnosis present

## 2014-01-15 DIAGNOSIS — I2119 ST elevation (STEMI) myocardial infarction involving other coronary artery of inferior wall: Principal | ICD-10-CM

## 2014-01-15 DIAGNOSIS — I2111 ST elevation (STEMI) myocardial infarction involving right coronary artery: Secondary | ICD-10-CM | POA: Diagnosis present

## 2014-01-15 DIAGNOSIS — I252 Old myocardial infarction: Secondary | ICD-10-CM | POA: Diagnosis not present

## 2014-01-15 DIAGNOSIS — E119 Type 2 diabetes mellitus without complications: Secondary | ICD-10-CM | POA: Diagnosis present

## 2014-01-15 DIAGNOSIS — H9190 Unspecified hearing loss, unspecified ear: Secondary | ICD-10-CM | POA: Diagnosis present

## 2014-01-15 DIAGNOSIS — E785 Hyperlipidemia, unspecified: Secondary | ICD-10-CM | POA: Diagnosis present

## 2014-01-15 DIAGNOSIS — Z87891 Personal history of nicotine dependence: Secondary | ICD-10-CM

## 2014-01-15 DIAGNOSIS — I498 Other specified cardiac arrhythmias: Secondary | ICD-10-CM | POA: Diagnosis present

## 2014-01-15 DIAGNOSIS — Z7982 Long term (current) use of aspirin: Secondary | ICD-10-CM | POA: Diagnosis not present

## 2014-01-15 DIAGNOSIS — Z85828 Personal history of other malignant neoplasm of skin: Secondary | ICD-10-CM | POA: Diagnosis not present

## 2014-01-15 DIAGNOSIS — I2510 Atherosclerotic heart disease of native coronary artery without angina pectoris: Secondary | ICD-10-CM | POA: Diagnosis present

## 2014-01-15 DIAGNOSIS — I129 Hypertensive chronic kidney disease with stage 1 through stage 4 chronic kidney disease, or unspecified chronic kidney disease: Secondary | ICD-10-CM | POA: Diagnosis present

## 2014-01-15 DIAGNOSIS — I4430 Unspecified atrioventricular block: Secondary | ICD-10-CM | POA: Diagnosis present

## 2014-01-15 DIAGNOSIS — E7800 Pure hypercholesterolemia, unspecified: Secondary | ICD-10-CM | POA: Diagnosis present

## 2014-01-15 DIAGNOSIS — I440 Atrioventricular block, first degree: Secondary | ICD-10-CM | POA: Diagnosis present

## 2014-01-15 DIAGNOSIS — J449 Chronic obstructive pulmonary disease, unspecified: Secondary | ICD-10-CM | POA: Diagnosis present

## 2014-01-15 DIAGNOSIS — R079 Chest pain, unspecified: Secondary | ICD-10-CM | POA: Diagnosis present

## 2014-01-15 DIAGNOSIS — D649 Anemia, unspecified: Secondary | ICD-10-CM | POA: Diagnosis not present

## 2014-01-15 DIAGNOSIS — M129 Arthropathy, unspecified: Secondary | ICD-10-CM | POA: Diagnosis present

## 2014-01-15 DIAGNOSIS — R570 Cardiogenic shock: Secondary | ICD-10-CM | POA: Diagnosis present

## 2014-01-15 DIAGNOSIS — H3530 Unspecified macular degeneration: Secondary | ICD-10-CM | POA: Diagnosis present

## 2014-01-15 DIAGNOSIS — F329 Major depressive disorder, single episode, unspecified: Secondary | ICD-10-CM | POA: Diagnosis present

## 2014-01-15 DIAGNOSIS — E1165 Type 2 diabetes mellitus with hyperglycemia: Secondary | ICD-10-CM | POA: Diagnosis present

## 2014-01-15 DIAGNOSIS — D696 Thrombocytopenia, unspecified: Secondary | ICD-10-CM | POA: Diagnosis not present

## 2014-01-15 DIAGNOSIS — R42 Dizziness and giddiness: Secondary | ICD-10-CM

## 2014-01-15 DIAGNOSIS — K219 Gastro-esophageal reflux disease without esophagitis: Secondary | ICD-10-CM | POA: Diagnosis present

## 2014-01-15 DIAGNOSIS — I219 Acute myocardial infarction, unspecified: Secondary | ICD-10-CM

## 2014-01-15 DIAGNOSIS — IMO0002 Reserved for concepts with insufficient information to code with codable children: Secondary | ICD-10-CM | POA: Diagnosis present

## 2014-01-15 DIAGNOSIS — E118 Type 2 diabetes mellitus with unspecified complications: Secondary | ICD-10-CM

## 2014-01-15 HISTORY — PX: LEFT HEART CATHETERIZATION WITH CORONARY ANGIOGRAM: SHX5451

## 2014-01-15 HISTORY — DX: Anemia, unspecified: D64.9

## 2014-01-15 HISTORY — DX: Cardiogenic shock: R57.0

## 2014-01-15 HISTORY — DX: Unspecified dementia, mild, without behavioral disturbance, psychotic disturbance, mood disturbance, and anxiety: F03.A0

## 2014-01-15 HISTORY — DX: Chronic kidney disease, stage 3 unspecified: N18.30

## 2014-01-15 HISTORY — DX: Essential (primary) hypertension: I10

## 2014-01-15 HISTORY — DX: Atherosclerotic heart disease of native coronary artery without angina pectoris: I25.10

## 2014-01-15 HISTORY — DX: Hyperlipidemia, unspecified: E78.5

## 2014-01-15 HISTORY — DX: Unspecified dementia without behavioral disturbance: F03.90

## 2014-01-15 HISTORY — DX: Chronic kidney disease, stage 3 (moderate): N18.3

## 2014-01-15 HISTORY — DX: Thrombocytopenia, unspecified: D69.6

## 2014-01-15 LAB — I-STAT TROPONIN, ED: Troponin i, poc: 0.59 ng/mL (ref 0.00–0.08)

## 2014-01-15 LAB — DIFFERENTIAL
BASOS ABS: 0 10*3/uL (ref 0.0–0.1)
BASOS PCT: 0 % (ref 0–1)
Eosinophils Absolute: 0.1 10*3/uL (ref 0.0–0.7)
Eosinophils Relative: 1 % (ref 0–5)
Lymphocytes Relative: 14 % (ref 12–46)
Lymphs Abs: 1.6 10*3/uL (ref 0.7–4.0)
Monocytes Absolute: 0.8 10*3/uL (ref 0.1–1.0)
Monocytes Relative: 7 % (ref 3–12)
NEUTROS ABS: 8.8 10*3/uL — AB (ref 1.7–7.7)
NEUTROS PCT: 78 % — AB (ref 43–77)

## 2014-01-15 LAB — CBC
HCT: 42 % (ref 36.0–46.0)
Hemoglobin: 13.8 g/dL (ref 12.0–15.0)
MCH: 32.1 pg (ref 26.0–34.0)
MCHC: 32.9 g/dL (ref 30.0–36.0)
MCV: 97.7 fL (ref 78.0–100.0)
Platelets: 162 10*3/uL (ref 150–400)
RBC: 4.3 MIL/uL (ref 3.87–5.11)
RDW: 14.9 % (ref 11.5–15.5)
WBC: 11.4 10*3/uL — ABNORMAL HIGH (ref 4.0–10.5)

## 2014-01-15 LAB — I-STAT CHEM 8, ED
BUN: 36 mg/dL — AB (ref 6–23)
CHLORIDE: 98 meq/L (ref 96–112)
CREATININE: 1.4 mg/dL — AB (ref 0.50–1.10)
Calcium, Ion: 1.11 mmol/L — ABNORMAL LOW (ref 1.13–1.30)
Glucose, Bld: 433 mg/dL — ABNORMAL HIGH (ref 70–99)
HCT: 45 % (ref 36.0–46.0)
Hemoglobin: 15.3 g/dL — ABNORMAL HIGH (ref 12.0–15.0)
Potassium: 4.4 mEq/L (ref 3.7–5.3)
SODIUM: 137 meq/L (ref 137–147)
TCO2: 30 mmol/L (ref 0–100)

## 2014-01-15 LAB — TROPONIN I
TROPONIN I: 15.13 ng/mL — AB (ref <0.30)
Troponin I: >20 ng/mL (ref <0.30)

## 2014-01-15 LAB — PROTIME-INR
INR: 0.98 (ref 0.00–1.49)
Prothrombin Time: 13 seconds (ref 11.6–15.2)

## 2014-01-15 LAB — POCT ACTIVATED CLOTTING TIME
Activated Clotting Time: 754 seconds
Activated Clotting Time: 185 seconds
Activated Clotting Time: 157 seconds

## 2014-01-15 LAB — MRSA PCR SCREENING: MRSA by PCR: NEGATIVE

## 2014-01-15 LAB — BASIC METABOLIC PANEL
ANION GAP: 18 — AB (ref 5–15)
BUN: 23 mg/dL (ref 6–23)
CALCIUM: 9.2 mg/dL (ref 8.4–10.5)
CO2: 26 meq/L (ref 19–32)
CREATININE: 1.26 mg/dL — AB (ref 0.50–1.10)
Chloride: 94 mEq/L — ABNORMAL LOW (ref 96–112)
GFR calc Af Amer: 42 mL/min — ABNORMAL LOW (ref >90)
GFR calc non Af Amer: 37 mL/min — ABNORMAL LOW (ref >90)
Glucose, Bld: 421 mg/dL — ABNORMAL HIGH (ref 70–99)
Potassium: 4.6 mEq/L (ref 3.7–5.3)
Sodium: 138 mEq/L (ref 137–147)

## 2014-01-15 LAB — GLUCOSE, CAPILLARY
Glucose-Capillary: 358 mg/dL — ABNORMAL HIGH (ref 70–99)
Glucose-Capillary: 310 mg/dL — ABNORMAL HIGH (ref 70–99)

## 2014-01-15 LAB — APTT: APTT: 24 s (ref 24–37)

## 2014-01-15 SURGERY — LEFT HEART CATHETERIZATION WITH CORONARY ANGIOGRAM
Anesthesia: Choice | Laterality: Bilateral

## 2014-01-15 MED ORDER — BIVALIRUDIN 250 MG IV SOLR
INTRAVENOUS | Status: AC
  Filled 2014-01-15: qty 250

## 2014-01-15 MED ORDER — SODIUM CHLORIDE 0.9 % IV SOLN: 1.0000 mL/kg/h | INTRAVENOUS | Status: AC

## 2014-01-15 MED ORDER — VERAPAMIL HCL 2.5 MG/ML IV SOLN
INTRAVENOUS | Status: AC
  Filled 2014-01-15: qty 2

## 2014-01-15 MED ORDER — ONDANSETRON HCL 4 MG/2ML IJ SOLN
4.0000 mg | Freq: Four times a day (QID) | INTRAMUSCULAR | Status: DC | PRN
  Administered 2014-01-15: 4 mg via INTRAVENOUS
  Filled 2014-01-15: qty 2

## 2014-01-15 MED ORDER — HEPARIN SODIUM (PORCINE) 5000 UNIT/ML IJ SOLN
5000.0000 [IU] | Freq: Three times a day (TID) | INTRAMUSCULAR | Status: DC
  Administered 2014-01-15 – 2014-01-20 (×14): 5000 [IU] via SUBCUTANEOUS
  Filled 2014-01-15 (×17): qty 1

## 2014-01-15 MED ORDER — HEPARIN (PORCINE) IN NACL 2-0.9 UNIT/ML-% IJ SOLN
INTRAMUSCULAR | Status: AC
  Filled 2014-01-15: qty 500

## 2014-01-15 MED ORDER — INSULIN ASPART 100 UNIT/ML ~~LOC~~ SOLN: 0.0000 [IU] | Freq: Three times a day (TID) | SUBCUTANEOUS | Status: DC

## 2014-01-15 MED ORDER — PANTOPRAZOLE SODIUM 40 MG PO TBEC
40.0000 mg | DELAYED_RELEASE_TABLET | Freq: Every day | ORAL | Status: DC
  Administered 2014-01-15 – 2014-01-20 (×6): 40 mg via ORAL
  Filled 2014-01-15 (×5): qty 1

## 2014-01-15 MED ORDER — NOREPINEPHRINE BITARTRATE 1 MG/ML IV SOLN
INTRAVENOUS | Status: AC
  Filled 2014-01-15: qty 4

## 2014-01-15 MED ORDER — NITROGLYCERIN 0.4 MG SL SUBL: 0.4000 mg | SUBLINGUAL_TABLET | SUBLINGUAL | Status: DC | PRN

## 2014-01-15 MED ORDER — HEPARIN SODIUM (PORCINE) 5000 UNIT/ML IJ SOLN: 60.0000 [IU]/kg | Freq: Once | INTRAMUSCULAR | Status: DC

## 2014-01-15 MED ORDER — GLIMEPIRIDE 4 MG PO TABS
4.0000 mg | ORAL_TABLET | Freq: Every day | ORAL | Status: DC
  Administered 2014-01-15 – 2014-01-20 (×5): 4 mg via ORAL
  Filled 2014-01-15 (×8): qty 1

## 2014-01-15 MED ORDER — ATROPINE SULFATE 0.1 MG/ML IJ SOLN
INTRAMUSCULAR | Status: AC
  Filled 2014-01-15: qty 10

## 2014-01-15 MED ORDER — TIROFIBAN HCL IV 5 MG/100ML
0.0750 ug/kg/min | INTRAVENOUS | Status: AC
  Administered 2014-01-15: 0.075 ug/kg/min via INTRAVENOUS
  Filled 2014-01-15 (×2): qty 100

## 2014-01-15 MED ORDER — HEPARIN SODIUM (PORCINE) 5000 UNIT/ML IJ SOLN
4000.0000 [IU] | Freq: Once | INTRAMUSCULAR | Status: AC
  Administered 2014-01-15: 4000 [IU] via INTRAVENOUS

## 2014-01-15 MED ORDER — ASPIRIN 81 MG PO CHEW: 324.0000 mg | CHEWABLE_TABLET | Freq: Once | ORAL | Status: DC

## 2014-01-15 MED ORDER — DIAZEPAM 2 MG PO TABS
2.0000 mg | ORAL_TABLET | Freq: Three times a day (TID) | ORAL | Status: DC | PRN
  Administered 2014-01-15: 2 mg via ORAL
  Filled 2014-01-15: qty 1

## 2014-01-15 MED ORDER — LIDOCAINE HCL (PF) 1 % IJ SOLN
INTRAMUSCULAR | Status: AC
  Filled 2014-01-15: qty 30

## 2014-01-15 MED ORDER — ACETAMINOPHEN 325 MG PO TABS: 650.0000 mg | ORAL_TABLET | ORAL | Status: DC | PRN

## 2014-01-15 MED ORDER — TIROFIBAN HCL IV 12.5 MG/250 ML
INTRAVENOUS | Status: AC
  Filled 2014-01-15: qty 250

## 2014-01-15 MED ORDER — FENTANYL CITRATE 0.05 MG/ML IJ SOLN
25.0000 ug | INTRAMUSCULAR | Status: DC | PRN
  Administered 2014-01-15: 25 ug via INTRAVENOUS
  Filled 2014-01-15: qty 2

## 2014-01-15 MED ORDER — NITROGLYCERIN 1 MG/10 ML FOR IR/CATH LAB
INTRA_ARTERIAL | Status: AC
  Filled 2014-01-15: qty 10

## 2014-01-15 MED ORDER — ASPIRIN 81 MG PO CHEW
81.0000 mg | CHEWABLE_TABLET | Freq: Every day | ORAL | Status: DC
  Administered 2014-01-15 – 2014-01-20 (×6): 81 mg via ORAL
  Filled 2014-01-15 (×5): qty 1

## 2014-01-15 MED ORDER — ATORVASTATIN CALCIUM 80 MG PO TABS
80.0000 mg | ORAL_TABLET | Freq: Every day | ORAL | Status: DC
  Administered 2014-01-15 – 2014-01-19 (×5): 80 mg via ORAL
  Filled 2014-01-15 (×6): qty 1

## 2014-01-15 MED ORDER — SERTRALINE HCL 100 MG PO TABS
100.0000 mg | ORAL_TABLET | Freq: Every day | ORAL | Status: DC
  Administered 2014-01-15 – 2014-01-20 (×6): 100 mg via ORAL
  Filled 2014-01-15 (×6): qty 1

## 2014-01-15 MED ORDER — TICAGRELOR 90 MG PO TABS
ORAL_TABLET | ORAL | Status: AC
  Filled 2014-01-15: qty 1

## 2014-01-15 MED ORDER — TICAGRELOR 90 MG PO TABS
90.0000 mg | ORAL_TABLET | Freq: Two times a day (BID) | ORAL | Status: DC
  Administered 2014-01-15 – 2014-01-20 (×11): 90 mg via ORAL
  Filled 2014-01-15 (×12): qty 1

## 2014-01-15 MED ORDER — INSULIN ASPART 100 UNIT/ML ~~LOC~~ SOLN
0.0000 [IU] | Freq: Every day | SUBCUTANEOUS | Status: DC
  Administered 2014-01-19: 3 [IU] via SUBCUTANEOUS

## 2014-01-15 MED ORDER — DOCUSATE SODIUM 100 MG PO CAPS
100.0000 mg | ORAL_CAPSULE | Freq: Every day | ORAL | Status: DC
  Administered 2014-01-15 – 2014-01-18 (×4): 100 mg via ORAL
  Filled 2014-01-15 (×7): qty 1

## 2014-01-15 MED ORDER — INSULIN ASPART 100 UNIT/ML ~~LOC~~ SOLN
0.0000 [IU] | Freq: Three times a day (TID) | SUBCUTANEOUS | Status: DC
  Administered 2014-01-15: 15 [IU] via SUBCUTANEOUS
  Administered 2014-01-15: 11 [IU] via SUBCUTANEOUS
  Administered 2014-01-16 (×2): 3 [IU] via SUBCUTANEOUS
  Administered 2014-01-16 – 2014-01-17 (×3): 2 [IU] via SUBCUTANEOUS
  Administered 2014-01-18: 3 [IU] via SUBCUTANEOUS

## 2014-01-15 MED ORDER — ASPIRIN 300 MG RE SUPP
300.0000 mg | Freq: Once | RECTAL | Status: AC
  Administered 2014-01-15: 300 mg via RECTAL

## 2014-01-15 NOTE — Progress Notes (Signed)
UR Completed.  [name] [phone] 01/15/2014

## 2014-01-15 NOTE — Progress Notes (Signed)
Blood sugar was 433 mg/dl at 0557 today.  Patient needs to have CBGs checked AC & HS if eating and every 4 hours if NPO.  Recommend starting Novolog SENSITIVE correction scale TID & HS if eating and every 4 hours if NPO.  May need basal insulin if CBGs continue greater than 180 mg/dl.  Will continue to follow while in hospital.  Harvel Ricks RN BSN CDE

## 2014-01-15 NOTE — Progress Notes (Addendum)
CRITICAL VALUE ALERT  Critical value received: trop 15.13  Date of notification:  01/15/2014  Time of notification:  0940  Critical value read back:Yes.    Nurse who received alert:  Deberah Castle   MD notified (1st page):    Time of first page:    MD notified (2nd page):  Time of second page:  Responding MD:   Time MD responded:  Dr. Sung Amabile notified

## 2014-01-15 NOTE — CV Procedure (Signed)
Cardiac Catheterization Procedure Note  Name: [name] MRN: [id number] DOB: 03/25/1922  Procedure: Left Heart Cath, Selective Coronary Angiography, LV pressure measurement,  PTCA of the RCA, temporary transvenous pacemaker  Indication: 80 yo WF with history of remote stenting of the proximal RCA presents with an inferior STEMI associated with cardiogenic shock.    Diagnostic Procedure Details: The right groin was prepped, draped, and anesthetized with 1% lidocaine. Using the modified Seldinger technique, a 6 French sheath was introduced into the right femoral artery. Standard Judkins catheters were used for selective coronary angiography and left ventricular pressures. Catheter exchanges were performed over a wire.  The diagnostic procedure was well-tolerated without immediate complications.  PROCEDURAL FINDINGS Hemodynamics: at the end of procedure AO 124/72 mean 97 mm Hg LV 118/28 mm Hg  Coronary angiography: Coronary dominance: right  Left mainstem: Normal.  Left anterior descending (LAD): The LAD is small in caliber throughout the mid to distal vessel. The distal LAD is occluded and supplied by left to left collaterals. The first diagonal has 80% stenosis proximally.   Left circumflex (LCx): The first OM is occluded proximally with left to left collaterals. The remainder of the LCx has mild nonobstructive disease.  Right coronary artery (RCA): 100% occlusion proximally.  Left ventriculography: Not done.  PCI Procedure Note:  Following the diagnostic procedure, the decision was made to proceed with PCI of the RCA. The patient was in cardiogenic shock with initial BP in the 60A systolic. HR was in the 40s with AV block. She was given IV atropine and started on IV Levophed at 10 mics. She was bolused with NS.  Weight-based bivalirudin was given for anticoagulation. Brilinta 180 mg was given orally.  Once a therapeutic ACT was achieved, a 6 [place] FR4 guide catheter was  inserted.  A prowater coronary guidewire was used to cross the lesion.  The lesion was predilated with a 2.0 mm balloon. The patient remained bradycardic and a 5 Fr venous sheath was inserted in the right femoral vein.  A temporary transvenous pacemaker was positioned in the RV apex and the patient was paced.  The lesion was then Ballooned with a 2.5 mm noncompliant balloon. There was extensive disease throughout the RCA with thrombus. There was also extensive calcification. The proximal to mid RCA was then ballooned with a 3.0 mm noncompliant balloon. With the heavy calcification there was incomplete balloon expansion but we were able to achieve a good angioplasty result with less than 30% residual and TIMI 3 flow. Given extensive disease and heavy calcification I did not think that stenting was appropriate. This would require over 50 mm of stent and I was concerned that the stent could not be fully expanded.  With reperfusion and restoration of good flow the patient's BP improved and she was weaned off IV Levophed. Her HR improved with return of AV conduction and her temporary pacing wire was removed. She was started on Aggrastat due to heavy thrombus burden.  Femoral hemostasis was planned with Manual compression.  The patient tolerated the PCI procedure well. There were no immediate procedural complications.  The patient was transferred to the ICU for further monitoring.  PCI Data: Vessel - RCA/Segment - proximal Percent Stenosis (pre)  100% TIMI-flow 0 Balloon angioplasty only Percent Stenosis (post) <30% TIMI-flow (post) 3  Final Conclusions:   1. Severe 3 vessel obstructive CAD. Chronic occlusion of distal LAD, OM1. 80% first diagonal. 100% acute occlusion of RCA 2. Elevated EDP 3. Successful PTCA only  of the RCA 4. Cardiogenic shock- resolved with reperfusion  Recommendations: Continue DAPT. Continue Aggrastat for 18 hours. Will hold beta blocker until BP stabilized.   Peter Martinique,  [place]  01/15/2014, 7:02 AM

## 2014-01-15 NOTE — ED Provider Notes (Signed)
CSN: [id number]     Arrival date & time 01/15/14  0534 History   First MD Initiated Contact with Patient 01/15/14 0550     Chief Complaint  Patient presents with  . Code STEMI     (Consider location/radiation/quality/duration/timing/severity/associated sxs/prior Treatment) HPI Patient presents as a code STEMI, via EMS. The patient has very poor hearing which limits the history of present illness. Patient has history of coronary disease.  She states that earlier this evening she had one episode of chest pain, that resulted.  However, approximately 2 hours ago, patient developed left-sided chest pain radiating inferiorly. There is associated nausea, but no vomiting. No relief with anything. Per EMS, on their arrival the patient was bradycardic, diaphoretic, uncomfortable appearing.  Patient received IV fluids, with some improvement in her vital signs.  Past Medical History  Diagnosis Date  . Vertigo   . Hernia   . Chronic kidney disease     "one kidney rotted" (02/09/2013)  . Myocardial infarction ?1963    "I've had 2" (02/09/2013)  . Glaucoma, right eye   . Complication of anesthesia     "had delusions" (02/09/2013)  . GERD (gastroesophageal reflux disease)   . Asthma   . Seasonal allergies   . Shortness of breath     "all the time now" (02/09/2013)  . Type II diabetes mellitus   . Blood transfusion reaction   . Arthritis     "all over" (02/09/2013)  . Depression   . Skin cancer of nose     "left side" (02/09/2013)  . Stroke 1960's    "a light one" (02/09/2013)  . Falls frequently    Past Surgical History  Procedure Laterality Date  . Appendectomy    . Umbilical hernia repair    . Tubal ligation    . Nephrectomy Left     Archie Endo 02/27/2005 (02/09/2013)  . Eye surgery Left   . Coronary angioplasty with stent placement    . Cardiac catheterization  2005    Archie Endo 05/07/2006 (02/09/2013)  . Vitrectomy Right     Archie Endo 01/19/2002  (02/09/2013)  . Cholecystectomy  02/2005     Archie Endo 02/28/2005 (02/09/2013)   No family history on file. History  Substance Use Topics  . Smoking status: Former Smoker -- 3.00 packs/day for 32 years    Types: Cigarettes    Quit date: 06/18/1973  . Smokeless tobacco: Never Used  . Alcohol Use: No     Comment: 02/09/2013 "haven't had a drink in 41 yr; used to have a beer once in awhile"   OB History   Grav Para Term Preterm Abortions TAB SAB Ect Mult Living                 Review of Systems  Unable to perform ROS: Acuity of condition      Allergies  Fluticasone-salmeterol  Home Medications   Prior to Admission medications   Medication Sig Start Date End Date Taking? Authorizing Provider  acetaminophen (TYLENOL) 325 MG tablet Take 650 mg by mouth every 6 (six) hours as needed. Pain     Historical Provider, MD  aspirin 81 MG EC tablet Take 81 mg by mouth daily.      Historical Provider, MD  docusate sodium (COLACE) 100 MG capsule Take 100 mg by mouth daily.     Historical Provider, MD  ergocalciferol (VITAMIN D2) 50000 UNITS capsule Take 50,000 Units by mouth once a week.    Historical Provider, MD  furosemide (LASIX) 20 MG  tablet Take 20 mg by mouth daily.    Historical Provider, MD  glimepiride (AMARYL) 4 MG tablet Take 4 mg by mouth daily before breakfast.    Historical Provider, MD  guaiFENesin (ROBITUSSIN) 100 MG/5ML liquid Take 100 mg by mouth every 4 (four) hours as needed for cough or congestion.    Historical Provider, MD  isosorbide mononitrate (IMDUR) 60 MG 24 hr tablet Take 60 mg by mouth daily.     Historical Provider, MD  levofloxacin (LEVAQUIN) 250 MG tablet Take 1 tablet (250 mg total) by mouth daily. 02/12/13   Duwaine Maxin, DO  metoprolol tartrate (LOPRESSOR) 25 MG tablet Take 25 mg by mouth daily.    Historical Provider, MD  nitroGLYCERIN (NITROSTAT) 0.4 MG SL tablet Place 0.4 mg under the tongue every 5 (five) minutes as needed for chest pain.     Historical Provider, MD  pantoprazole (PROTONIX) 40 MG tablet  Take 40 mg by mouth daily.      Historical Provider, MD  polyethylene glycol (MIRALAX / GLYCOLAX) packet Take 17 g by mouth 2 (two) times daily.    Historical Provider, MD  senna (SENOKOT) 8.6 MG TABS Take 1 tablet by mouth at bedtime. Sunday    Historical Provider, MD  sertraline (ZOLOFT) 100 MG tablet Take 100 mg by mouth daily.      Historical Provider, MD  Skin Protectants, Misc. (EUCERIN) cream Apply 1 application topically 2 (two) times daily.    Historical Provider, MD   BP 112/87  Pulse 52  Temp(Src) 97.4 F (36.3 C) (Rectal)  Resp 17  Ht 5\' 4"  (1.626 m)  Wt 157 lb (71.215 kg)  BMI 26.94 kg/m2  SpO2 94%   Physical Exam  Nursing note and vitals reviewed. Constitutional: She is oriented to person, place, and time. She appears well-developed and well-nourished. No distress.  HENT:  Head: Normocephalic and atraumatic.  Eyes: Conjunctivae and EOM are normal.  Cardiovascular: Regular rhythm.  Bradycardia present.   Murmur heard. Pulmonary/Chest: Effort normal and breath sounds normal. No stridor. No respiratory distress.  Abdominal: She exhibits no distension.  Musculoskeletal: She exhibits no edema.  Neurological: She is alert and oriented to person, place, and time.  Very poor hearing. No facial asymmetry. Speech is clear, brief period   Skin: Skin is warm and dry.  Psychiatric: She has a normal mood and affect.    ED Course  Procedures (including critical care time) Labs Review Labs Reviewed  CBC - Abnormal; Notable for the following:    WBC 11.4 (*)    All other components within normal limits  DIFFERENTIAL - Abnormal; Notable for the following:    Neutrophils Relative % 78 (*)    Neutro Abs 8.8 (*)    All other components within normal limits  I-STAT TROPOININ, ED - Abnormal; Notable for the following:    Troponin i, poc 0.59 (*)    All other components within normal limits  I-STAT CHEM 8, ED - Abnormal; Notable for the following:    BUN 36 (*)     Creatinine, Ser 1.40 (*)    Glucose, Bld 433 (*)    Calcium, Ion 1.11 (*)    Hemoglobin 15.3 (*)    All other components within normal limits  PROTIME-INR  APTT  BASIC METABOLIC PANEL   EMS EKG shows ST elevations in inferior leads.  EKG here shows similar ST elevations in the inferior leads, rate 50, abnormal After my initial evaluation the patient was evaluated by cardiology.  Patient was taken to the catheterization lab for further evaluation and management.  I took multiple times to contact the patient's family members to discuss her care.  The patient has very poor hearing, she's capacity to make decisions, and eventually agreed to have catheterization.  I discussed the case at length with cardiology.  MDM   Final diagnoses:  ST elevation myocardial infarction (STEMI) of inferior wall    Patient presents as a STEMI, with ongoing chest pain, as he elevations in the inferior leads concerning for inferior ischemia. In the emergency department the patient had supplemental oxygen, and initiation of heparin, aspirin, and after discussion with our cardiology team, the patient was taken to the catheterization lab.  CRITICAL CARE Performed by: Carmin Muskrat Total critical care time: 30 Critical care time was exclusive of separately billable procedures and treating other patients. Critical care was necessary to treat or prevent imminent or life-threatening deterioration. Critical care was time spent personally by me on the following activities: development of treatment plan with patient and/or surrogate as well as nursing, discussions with consultants, evaluation of patient's response to treatment, examination of patient, obtaining history from patient or surrogate, ordering and performing treatments and interventions, ordering and review of laboratory studies, ordering and review of radiographic studies, pulse oximetry and re-evaluation of patient's condition.   Carmin Muskrat,  MD 01/15/14 [phone]

## 2014-01-15 NOTE — ED Notes (Signed)
Cath lab ready. Patient transported to cath lab.

## 2014-01-15 NOTE — H&P (Signed)
Physician History and Physical    [name] MRN: [id number] DOB/AGE: 01-01-1922 80 y.o. Admit date: 01/15/2014  Primary Care Physician: None Primary Cardiologist: Johnsie Cancel  HPI: 80 yo with history of CAD (see below) but no recent cardiology evaluation called EMS tonight with chest pain.  She had had substernal chest aching radiating up her neck earlier during the afternoon.  She had very severe pain again at 4 am and called EMS.  Currently in the ER, she is having active chest pain.  HR in the mid 40s with ECG suggestive of acute inferolateral MI.  BP stable.  We were unable to contact her daughter.  Patient wishes to proceed with emergent cardiac catheterization.   Review of systems complete and found to be negative unless listed above   PMH: 1. CAD: MI with pRCA stent in 1996, last cath in 2005 witih patent RCA stent, subtotaled OM. 2. ?mild dementia 3. Hyperlipidemia 4. HTN 5. COPD: Prior smoker 6. GERD 7. H/o CCY 8. Macular degeneration 9. Hard of hearing  10. Falls 11. Diabetes mellitus type II  No current facility-administered medications for this encounter.   Current Outpatient Prescriptions  Medication Sig Dispense Refill  . acetaminophen (TYLENOL) 325 MG tablet Take 650 mg by mouth every 6 (six) hours as needed. Pain       . aspirin 81 MG EC tablet Take 81 mg by mouth daily.        Marland Kitchen docusate sodium (COLACE) 100 MG capsule Take 100 mg by mouth daily.       . ergocalciferol (VITAMIN D2) 50000 UNITS capsule Take 50,000 Units by mouth once a week.      . furosemide (LASIX) 20 MG tablet Take 20 mg by mouth daily.      Marland Kitchen glimepiride (AMARYL) 4 MG tablet Take 4 mg by mouth daily before breakfast.      . guaiFENesin (ROBITUSSIN) 100 MG/5ML liquid Take 100 mg by mouth every 4 (four) hours as needed for cough or congestion.      . isosorbide mononitrate (IMDUR) 60 MG 24 hr tablet Take 60 mg by mouth daily.       Marland Kitchen levofloxacin (LEVAQUIN) 250 MG tablet Take 1 tablet (250  mg total) by mouth daily.  3 tablet  0  . metoprolol tartrate (LOPRESSOR) 25 MG tablet Take 25 mg by mouth daily.      . nitroGLYCERIN (NITROSTAT) 0.4 MG SL tablet Place 0.4 mg under the tongue every 5 (five) minutes as needed for chest pain.       . pantoprazole (PROTONIX) 40 MG tablet Take 40 mg by mouth daily.        . polyethylene glycol (MIRALAX / GLYCOLAX) packet Take 17 g by mouth 2 (two) times daily.      Marland Kitchen senna (SENOKOT) 8.6 MG TABS Take 1 tablet by mouth at bedtime. Sunday      . sertraline (ZOLOFT) 100 MG tablet Take 100 mg by mouth daily.        . Skin Protectants, Misc. (EUCERIN) cream Apply 1 application topically 2 (two) times daily.         No family history on file.  History   Social History  . Marital Status: Widowed    Spouse Name: N/A    Number of Children: N/A  . Years of Education: N/A   Occupational History  . Not on file.   Social History Main Topics  . Smoking status: Former Smoker -- 3.00 packs/day for  32 years    Types: Cigarettes    Quit date: 06/18/1973  . Smokeless tobacco: Never Used  . Alcohol Use: No     Comment: 02/09/2013 "haven't had a drink in 29 yr; used to have a beer once in awhile"  . Drug Use: No  . Sexual Activity: No   Other Topics Concern  . Not on file   Social History Narrative  . No narrative on file    Physical Exam: Blood pressure 112/87, pulse 52, resp. rate 17, height 5' 1.02" (1.55 m), weight 156 lb 15.5 oz (71.2 kg), SpO2 94.00%.  General: NAD Neck: No JVD, no thyromegaly or thyroid nodule.  Lungs: Clear to auscultation bilaterally with normal respiratory effort. CV: Nondisplaced PMI.  Heart regular S1/S2, no S3/S4, no murmur.  No peripheral edema.  No carotid bruit.  Normal pedal pulses.  Abdomen: Soft, nontender, no hepatosplenomegaly, no distention.  Skin: Intact without lesions or rashes.  Neurologic: Alert and oriented x 3.  Psych: Normal affect. Extremities: No clubbing or cyanosis.  HEENT: Normal.    Labs:   Lab Results  Component Value Date   WBC 6.7 02/12/2013   HGB 13.5 02/12/2013   HCT 41.7 02/12/2013   MCV 95.4 02/12/2013   PLT 139* 02/12/2013   EKG: sinus brady at 52, acute inferolateral MI  ASSESSMENT AND PLAN: Acute inferolateral MI with sinus bradycardia.  Patient has active chest pain.  She will be taken emergently to the cardiac catheterization lab.  Further recommendations based on findings.    Signed: Loralie Champagne 01/15/2014, 5:54 AM

## 2014-01-15 NOTE — ED Notes (Addendum)
Patient having central chest pain radiating to left shoulder into her neck and sown into stomach. Initially heart rate was 44 SB. Patient has not had asa. Nitro was in hand on scene, but unsure if she took any. Hx of MI x2 with stents in place. Patient was diaphoretic on scene and sats were 85% on RA. HR 52 at this time and BP 112/87 100% on RA.

## 2014-01-15 NOTE — Progress Notes (Addendum)
Right femoral sheaths removed at 72:15 by Elmo Putt (cath lab).  Hemostasis achieved after 20 minutes of manual pressure; groin level 0 with slight bruising anterior to stick site.  Right distal pulse +2.  Patient instructed to keep right leg straight, keep head on pillow and brace groin if she needs to cough/sneeze/laugh.  Will continue to monitor. Blodgett, Ardeth Sportsman

## 2014-01-15 NOTE — Progress Notes (Signed)
Echo Lab  2D Echocardiogram completed.  [place], RDCS 01/15/2014 11:33 AM

## 2014-01-15 NOTE — Progress Notes (Signed)
EKG CRITICAL VALUE     12 lead EKG performed.  Critical value noted.  Allena Katz, RN notified.   Laurell Josephs, [place] 01/15/2014 7:59 AM

## 2014-01-16 LAB — CBC
HCT: 38.3 % (ref 36.0–46.0)
HEMOGLOBIN: 12.4 g/dL (ref 12.0–15.0)
MCH: 32 pg (ref 26.0–34.0)
MCHC: 32.4 g/dL (ref 30.0–36.0)
MCV: 98.7 fL (ref 78.0–100.0)
Platelets: 128 10*3/uL — ABNORMAL LOW (ref 150–400)
RBC: 3.88 MIL/uL (ref 3.87–5.11)
RDW: 14.8 % (ref 11.5–15.5)
WBC: 10.7 10*3/uL — ABNORMAL HIGH (ref 4.0–10.5)

## 2014-01-16 LAB — BASIC METABOLIC PANEL
Anion gap: 11 (ref 5–15)
Anion gap: 10 (ref 5–15)
BUN: 21 mg/dL (ref 6–23)
BUN: 18 mg/dL (ref 6–23)
CHLORIDE: 104 meq/L (ref 96–112)
CHLORIDE: 101 meq/L (ref 96–112)
CO2: 25 meq/L (ref 19–32)
CO2: 24 mEq/L (ref 19–32)
CREATININE: 1.01 mg/dL (ref 0.50–1.10)
CREATININE: 0.9 mg/dL (ref 0.50–1.10)
Calcium: 8.3 mg/dL — ABNORMAL LOW (ref 8.4–10.5)
Calcium: 8.6 mg/dL (ref 8.4–10.5)
GFR calc Af Amer: 55 mL/min — ABNORMAL LOW (ref >90)
GFR calc non Af Amer: 48 mL/min — ABNORMAL LOW (ref >90)
GFR calc non Af Amer: 55 mL/min — ABNORMAL LOW (ref >90)
GFR, EST AFRICAN AMERICAN: 64 mL/min — AB (ref >90)
GLUCOSE: 148 mg/dL — AB (ref 70–99)
Glucose, Bld: 105 mg/dL — ABNORMAL HIGH (ref 70–99)
Potassium: 3.6 mEq/L — ABNORMAL LOW (ref 3.7–5.3)
Potassium: 4.5 mEq/L (ref 3.7–5.3)
Sodium: 140 mEq/L (ref 137–147)
Sodium: 135 mEq/L — ABNORMAL LOW (ref 137–147)

## 2014-01-16 LAB — GLUCOSE, CAPILLARY
GLUCOSE-CAPILLARY: 137 mg/dL — AB (ref 70–99)
Glucose-Capillary: 130 mg/dL — ABNORMAL HIGH (ref 70–99)
Glucose-Capillary: 154 mg/dL — ABNORMAL HIGH (ref 70–99)
Glucose-Capillary: 184 mg/dL — ABNORMAL HIGH (ref 70–99)
Glucose-Capillary: 118 mg/dL — ABNORMAL HIGH (ref 70–99)

## 2014-01-16 MED ORDER — FLEET ENEMA 7-19 GM/118ML RE ENEM
1.0000 | ENEMA | Freq: Every day | RECTAL | Status: DC | PRN
  Administered 2014-01-16: 1 via RECTAL
  Filled 2014-01-16 (×2): qty 1

## 2014-01-16 MED ORDER — BISACODYL 5 MG PO TBEC
5.0000 mg | DELAYED_RELEASE_TABLET | Freq: Every day | ORAL | Status: DC | PRN
  Administered 2014-01-16 – 2014-01-17 (×2): 5 mg via ORAL
  Filled 2014-01-16 (×2): qty 1

## 2014-01-16 NOTE — Progress Notes (Signed)
Patient ID: [name], female   DOB: 08-19-1921, 79 y.o.   MRN: [id number]   SUBJECTIVE: No chest pain. Seems to be doing well after PTCA yesterday.  Creatinine better, BP remains soft.   Scheduled Meds: . aspirin  81 mg Oral Daily  . atorvastatin  80 mg Oral q1800  . docusate sodium  100 mg Oral Daily  . glimepiride  4 mg Oral QAC breakfast  . heparin  5,000 Units Subcutaneous 3 times per day  . insulin aspart  0-15 Units Subcutaneous TID WC  . insulin aspart  0-5 Units Subcutaneous QHS  . pantoprazole  40 mg Oral Daily  . sertraline  100 mg Oral Daily  . ticagrelor  90 mg Oral BID   Continuous Infusions:  PRN Meds:.acetaminophen, diazepam, fentaNYL, nitroGLYCERIN, ondansetron (ZOFRAN) IV    Filed Vitals:   01/16/14 0200 01/16/14 0300 01/16/14 0400 01/16/14 0805  BP: 108/52 103/78 105/55   Pulse: 85 89 92   Temp:    98.7 F (37.1 C)  TempSrc:    Oral  Resp: 23 20 27    Height:      Weight:      SpO2: 97% 97% 98%     Intake/Output Summary (Last 24 hours) at 01/16/14 0919 Last data filed at 01/16/14 0500  Gross per 24 hour  Intake 1550.93 ml  Output    550 ml  Net 1000.93 ml    LABS: Basic Metabolic Panel:  Recent Labs  01/15/14 0545 01/15/14 0557 01/16/14 0230  NA 138 137 140  K 4.6 4.4 3.6*  CL 94* 98 104  CO2 26  --  25  GLUCOSE 421* 433* 105*  BUN 23 36* 21  CREATININE 1.26* 1.40* 1.01  CALCIUM 9.2  --  8.3*   Liver Function Tests: No results found for this basename: AST, ALT, ALKPHOS, BILITOT, PROT, ALBUMIN,  in the last 72 hours No results found for this basename: LIPASE, AMYLASE,  in the last 72 hours CBC:  Recent Labs  01/15/14 0545 01/15/14 0557 01/16/14 0230  WBC 11.4*  --  10.7*  NEUTROABS 8.8*  --   --   HGB 13.8 15.3* 12.4  HCT 42.0 45.0 38.3  MCV 97.7  --  98.7  PLT 162  --  128*   Cardiac Enzymes:  Recent Labs  01/15/14 0740 01/15/14 1340 01/15/14 2127  TROPONINI 15.13* >20.00* >20.00*   BNP: No components found  with this basename: POCBNP,  D-Dimer: No results found for this basename: DDIMER,  in the last 72 hours Hemoglobin A1C: No results found for this basename: HGBA1C,  in the last 72 hours Fasting Lipid Panel: No results found for this basename: CHOL, HDL, LDLCALC, TRIG, CHOLHDL, LDLDIRECT,  in the last 72 hours Thyroid Function Tests: No results found for this basename: TSH, T4TOTAL, FREET3, T3FREE, THYROIDAB,  in the last 72 hours Anemia Panel: No results found for this basename: VITAMINB12, FOLATE, FERRITIN, TIBC, IRON, RETICCTPCT,  in the last 72 hours  RADIOLOGY: No results found.  PHYSICAL EXAM General: NAD Neck: No JVD, no thyromegaly or thyroid nodule.  Lungs: Clear to auscultation bilaterally with normal respiratory effort. CV: Nondisplaced PMI.  Heart regular S1/S2, no S3/S4, no murmur.  No peripheral edema.   Abdomen: Soft, nontender, no hepatosplenomegaly, no distention.  Neurologic: Alert and oriented x 3.  Psych: Normal affect. Extremities: No clubbing or cyanosis.   TELEMETRY: Reviewed telemetry pt in NSR  ASSESSMENT AND PLAN: 80 yo with history of CAD presented  yesterday with inferior STEMI, s/p extensive PTCA of RCA. 1. CAD: Known CAD with prior RCA stent.  Admitted yesterday with inferior MI.  Had extensive PTCA of occluded RCA (no stents).  Distal LAD and OM1 were chronically occluded.  EF 55-60% on echo yesterday.  - Continue ASA, statin. - Holding off on ACEI and beta blocker for now with soft blood pressure.  - Ticagrelor 90 bid.  Would continue this for a month then transition to Plavix.  Would use Plavix long-term given significant vascular disease as long as no bleeding problems. 2. Disposition: Lives at home alone, probably has mild dementia.  Will have PT and cardiac rehab see. Will get social work involvement.   Loralie Champagne 01/16/2014

## 2014-01-16 NOTE — Progress Notes (Signed)
CARDIAC REHAB PHASE I   Pt asleep in bed upon arrival.  Pt stated that she did not walk to ambulate at this time due to back pain and because she needed to rest.  Pt also refused transfer to recliner.  Attempted a small portion of education, but pt also refused that.  Left MI book in room for pt to review.  Pt c/o of constipation.  Reported to RN of c/o.  Call bell and phone in reach.   Lillia Dallas MS, ACSM RCEP 2:50 PM 01/16/2014

## 2014-01-17 DIAGNOSIS — I2510 Atherosclerotic heart disease of native coronary artery without angina pectoris: Secondary | ICD-10-CM

## 2014-01-17 DIAGNOSIS — R42 Dizziness and giddiness: Secondary | ICD-10-CM

## 2014-01-17 LAB — GLUCOSE, CAPILLARY
GLUCOSE-CAPILLARY: 92 mg/dL (ref 70–99)
Glucose-Capillary: 148 mg/dL — ABNORMAL HIGH (ref 70–99)
Glucose-Capillary: 146 mg/dL — ABNORMAL HIGH (ref 70–99)
Glucose-Capillary: 114 mg/dL — ABNORMAL HIGH (ref 70–99)

## 2014-01-17 LAB — CBC
HCT: 36.4 % (ref 36.0–46.0)
HEMOGLOBIN: 11.6 g/dL — AB (ref 12.0–15.0)
MCH: 31 pg (ref 26.0–34.0)
MCHC: 31.9 g/dL (ref 30.0–36.0)
MCV: 97.3 fL (ref 78.0–100.0)
Platelets: 128 10*3/uL — ABNORMAL LOW (ref 150–400)
RBC: 3.74 MIL/uL — AB (ref 3.87–5.11)
RDW: 15 % (ref 11.5–15.5)
WBC: 9 10*3/uL (ref 4.0–10.5)

## 2014-01-17 NOTE — Evaluation (Signed)
Physical Therapy Evaluation Patient Details Name: [name] MRN: [id number] DOB: 07-23-1921 Today's Date: 01/17/2014   History of Present Illness  Adm 01/15/14 with chest pain and underwent cardiac cath with PTCA. Troponin remained elevated throughout 01/15/14 and has not been checked since. PMHx- CAD, MI, stent, CVA, DM, HOH, macular degeneration, ?dementia  Clinical Impression  Pt admitted with MI. Pt is fiercely independent and does not want to leave her apartment. She has assist intermittently/as needed for meal prep, grocery shopping, transportation. She mobilized today at min-guard assist level with no loss of balance or unsafe use of DME. Pt currently with functional limitations due to the deficits listed below (see PT Problem List).  Pt will benefit from skilled PT to increase their independence and safety with mobility to allow discharge to the venue listed below.       Follow Up Recommendations Home health PT;Supervision - Intermittent    Equipment Recommendations  ? Wheelchair--pt wants w/c to be able to get her own mail; may need electric scooter; to further assess   Recommendations for Other Services OT consult     Precautions / Restrictions Precautions Precautions: Fall      Mobility  Bed Mobility               General bed mobility comments: up on BSC   Transfers Overall transfer level: Needs assistance Equipment used: Rolling walker (2 wheeled) Transfers: Sit to/from Stand Sit to Stand: Supervision         General transfer comment: x 4; requires momentum to complete lift-off  Ambulation/Gait Ambulation/Gait assistance: Min guard Ambulation Distance (Feet): 80 Feet Assistive device: Rolling walker (2 wheeled) Gait Pattern/deviations: Step-through pattern;Decreased stride length Gait velocity: slow   General Gait Details: pt steady with RW; states she uses hers all the time due to h/o falls  Stairs            Wheelchair Mobility     Modified Rankin (Stroke Patients Only)       Balance                                             Pertinent Vitals/Pain HR 102-114 bpm SaO2 94-92% on RA while walking    Home Living Family/patient expects to be discharged to:: Private residence Living Arrangements: Alone Available Help at Discharge: Personal care attendant;Neighbor;Available PRN/intermittently Type of Home: Apartment (? Assisted Living apartment (pt unsure; website unclear)) Home Access: Level entry     Home Layout: One level Home Equipment: Walker - 2 wheels;Bedside commode;Shower seat Additional Comments: HH aide comes 5 days/week; neighbor brings groceries; Pt also has life-line.    Prior Function Level of Independence: Independent with assistive device(s)         Comments: reports she used to fall every day; now not falling (has been months);      Hand Dominance        Extremity/Trunk Assessment   Upper Extremity Assessment: Generalized weakness (arthritic joint changes in hands)     RUE Sensation: history of peripheral neuropathy     Lower Extremity Assessment: Generalized weakness      Cervical / Trunk Assessment: Kyphotic (mild)  Communication   Communication: HOH  Cognition Arousal/Alertness: Awake/alert Behavior During Therapy: WFL for tasks assessed/performed Overall Cognitive Status: Within Functional Limits for tasks assessed  General Comments      Exercises        Assessment/Plan    PT Assessment Patient needs continued PT services  PT Diagnosis Difficulty walking;Generalized weakness   PT Problem List Decreased strength;Decreased activity tolerance;Decreased balance;Decreased mobility;Impaired sensation  PT Treatment Interventions Gait training;Functional mobility training;Therapeutic activities;Therapeutic exercise;Balance training;Patient/family education   PT Goals (Current goals can be found in the Care Plan  section) Acute Rehab PT Goals Patient Stated Goal: to stay in her apartment; obtain a wheelchair for going to get her mail PT Goal Formulation: With patient Time For Goal Achievement: 01/24/14 Potential to Achieve Goals: Good    Frequency Min 3X/week   Barriers to discharge Decreased caregiver support good support of neighbor, [place], daughter in [place] During Treatment: Gait belt Activity Tolerance: Patient tolerated treatment well Patient left: in chair;with call bell/phone within reach;with chair alarm set Nurse Communication: Mobility status         Time: 1325-1402 PT Time Calculation (min): 37 min   Charges:   PT Evaluation $Initial PT Evaluation Tier I: 1 Procedure PT Treatments $Gait Training: 8-22 mins $Therapeutic Activity: 8-22 mins   PT G Codes:          Daishon Chui 01/21/14, 2:18 PM Pager [phone]

## 2014-01-17 NOTE — Progress Notes (Signed)
PT Cancellation Note  Patient Details Name: [name] MRN: [id number] DOB: 1922/05/26   Cancelled Treatment:    Reason Eval Not Completed: Patient unavailable--eating lunch. Will return later today. Noted Troponin >20 and per RN pt is not having chest pain, MD is aware, and pt is only going to receive medical treatment (non-invasive) moving forward.   Eiley Mcginnity 01/17/2014, 12:22 PM Pager [phone]

## 2014-01-17 NOTE — Progress Notes (Signed)
Patient ID: [name], female   DOB: 1921/08/16, 80 y.o.   MRN: [id number]    SUBJECTIVE: No chest pain. Baseline SOB.  Creatinine better, BP remains soft.   Scheduled Meds: . aspirin  81 mg Oral Daily  . atorvastatin  80 mg Oral q1800  . docusate sodium  100 mg Oral Daily  . glimepiride  4 mg Oral QAC breakfast  . heparin  5,000 Units Subcutaneous 3 times per day  . insulin aspart  0-15 Units Subcutaneous TID WC  . insulin aspart  0-5 Units Subcutaneous QHS  . pantoprazole  40 mg Oral Daily  . sertraline  100 mg Oral Daily  . ticagrelor  90 mg Oral BID   Continuous Infusions:  PRN Meds:.acetaminophen, bisacodyl, diazepam, fentaNYL, nitroGLYCERIN, ondansetron (ZOFRAN) IV, sodium phosphate  Filed Vitals:   01/16/14 2045 01/16/14 2050 01/16/14 2300 01/17/14 0345  BP: 92/50 100/52  107/56  Pulse: 100 100 97 100  Temp:    98.8 F (37.1 C)  TempSrc:    Oral  Resp:    20  Height:      Weight:      SpO2:    91%    Intake/Output Summary (Last 24 hours) at 01/17/14 1010 Last data filed at 01/17/14 0700  Gross per 24 hour  Intake    240 ml  Output    152 ml  Net     88 ml   LABS: Basic Metabolic Panel:  Recent Labs  01/16/14 0230 01/16/14 1020  NA 140 135*  K 3.6* 4.5  CL 104 101  CO2 25 24  GLUCOSE 105* 148*  BUN 21 18  CREATININE 1.01 0.90  CALCIUM 8.3* 8.6   CBC:  Recent Labs  01/15/14 0545  01/16/14 0230 01/17/14 0345  WBC 11.4*  --  10.7* 9.0  NEUTROABS 8.8*  --   --   --   HGB 13.8  < > 12.4 11.6*  HCT 42.0  < > 38.3 36.4  MCV 97.7  --  98.7 97.3  PLT 162  --  128* 128*  < > = values in this interval not displayed. Cardiac Enzymes:  Recent Labs  01/15/14 0740 01/15/14 1340 01/15/14 2127  TROPONINI 15.13* >20.00* >20.00*   RADIOLOGY: No results found.  PHYSICAL EXAM General: NAD Neck: No JVD, no thyromegaly or thyroid nodule.  Lungs: Clear to auscultation bilaterally with normal respiratory effort. CV: Nondisplaced PMI.  Heart  regular S1/S2, no S3/S4, no murmur.  No peripheral edema.   Abdomen: Soft, nontender, no hepatosplenomegaly, no distention.  Neurologic: Alert and oriented x 3.  Psych: Normal affect. Extremities: No clubbing or cyanosis.   TELEMETRY: Reviewed telemetry pt in NSR   ASSESSMENT AND PLAN:  80 yo with history of CAD presented yesterday with inferior STEMI, s/p extensive PTCA of RCA.  1. CAD: Known CAD with prior RCA stent.  Admitted on 01/15/14 with inferior MI.  Had extensive PTCA of occluded RCA (no stents).  Distal LAD and OM1 were chronically occluded.  EF 55-60% on echo yesterday.   - Continue ASA, statin. - Holding off on ACEI and beta blocker for now with soft blood pressure.  - Ticagrelor 90 bid.  Would continue this for a month then transition to Plavix.  Would use Plavix long-term given significant vascular disease as long as no bleeding problems.  2. Disposition: Lives at home alone, probably has mild dementia.  Cardiac rehab saw her, she refused to walk.  I don't think its  safe for her to go home, she states that she recently fell. Will have SW see her for placement to rehab facility.    Dorothy Spark 01/17/2014

## 2014-01-17 NOTE — Progress Notes (Signed)
Pt is currently enrolled in [facility]. [facility] currently suggesting pt go to [facility] for rehab. [facility]  NP states she is concerned about pt going home alone and states pt does not always take all medications. Social work notified and aware. Will continue to monitor.

## 2014-01-18 ENCOUNTER — Telehealth: Payer: Self-pay | Admitting: Cardiology

## 2014-01-18 LAB — GLUCOSE, CAPILLARY
GLUCOSE-CAPILLARY: 99 mg/dL (ref 70–99)
GLUCOSE-CAPILLARY: 154 mg/dL — AB (ref 70–99)
Glucose-Capillary: 81 mg/dL (ref 70–99)
Glucose-Capillary: 156 mg/dL — ABNORMAL HIGH (ref 70–99)

## 2014-01-18 MED FILL — Sodium Chloride IV Soln 0.9%: INTRAVENOUS | Qty: 50 | Status: AC

## 2014-01-18 NOTE — Progress Notes (Signed)
Subjective: Ambulated yesterday without difficulty  Objective: Vital signs in last 24 hours: Temp:  [98.1 F (36.7 C)-98.2 F (36.8 C)] 98.2 F (36.8 C) (08/02 1931) Pulse Rate:  [97-98] 97 (08/02 1931) Resp:  [16-18] 18 (08/02 1931) BP: (110-111)/(54-55) 111/54 mmHg (08/02 1931) SpO2:  [93 %-95 %] 95 % (08/02 1931) Last BM Date: 01/16/14  Intake/Output from previous day: 08/02 0701 - 08/03 0700 In: 120 [P.O.:120] Out: -  Intake/Output this shift:    Medications Current Facility-Administered Medications  Medication Dose Route Frequency Provider Last Rate Last Dose  . acetaminophen (TYLENOL) tablet 650 mg  650 mg Oral Q4H PRN Peter M Martinique, MD      . aspirin chewable tablet 81 mg  81 mg Oral Daily Peter M Martinique, MD   81 mg at 01/17/14 0739  . atorvastatin (LIPITOR) tablet 80 mg  80 mg Oral q1800 Peter M Martinique, MD   80 mg at 01/17/14 1712  . bisacodyl (DULCOLAX) EC tablet 5 mg  5 mg Oral Daily PRN Rogelia Mire, NP   5 mg at 01/17/14 0739  . diazepam (VALIUM) tablet 2 mg  2 mg Oral Q8H PRN Amy D Clegg, NP   2 mg at 01/15/14 1442  . docusate sodium (COLACE) capsule 100 mg  100 mg Oral Daily Peter M Martinique, MD   100 mg at 01/17/14 0739  . fentaNYL (SUBLIMAZE) injection 25 mcg  25 mcg Intravenous Q2H PRN Jolaine Artist, MD   25 mcg at 01/15/14 1130  . glimepiride (AMARYL) tablet 4 mg  4 mg Oral QAC breakfast Peter M Martinique, MD   4 mg at 01/17/14 1610  . heparin injection 5,000 Units  5,000 Units Subcutaneous 3 times per day Peter M Martinique, MD   5,000 Units at 01/18/14 [phone]  . insulin aspart (novoLOG) injection 0-15 Units  0-15 Units Subcutaneous TID WC Larey Dresser, MD   2 Units at 01/17/14 1712  . insulin aspart (novoLOG) injection 0-5 Units  0-5 Units Subcutaneous QHS Jolaine Artist, MD      . nitroGLYCERIN (NITROSTAT) SL tablet 0.4 mg  0.4 mg Sublingual Q5 min PRN Peter M Martinique, MD      . ondansetron [facility]) injection 4 mg  4 mg Intravenous Q6H PRN Peter  M Martinique, MD   4 mg at 01/15/14 1210  . pantoprazole (PROTONIX) EC tablet 40 mg  40 mg Oral Daily Peter M Martinique, MD   40 mg at 01/17/14 0739  . sertraline (ZOLOFT) tablet 100 mg  100 mg Oral Daily Peter M Martinique, MD   100 mg at 01/17/14 1027  . sodium phosphate (FLEET) 7-19 GM/118ML enema 1 enema  1 enema Rectal Daily PRN Rogelia Mire, NP   1 enema at 01/16/14 1720  . ticagrelor (BRILINTA) tablet 90 mg  90 mg Oral BID Peter M Martinique, MD   90 mg at 01/17/14 2127    PE: General appearance: alert, cooperative and no distress Lungs: clear to auscultation bilaterally Heart: regular rate and rhythm, S1, S2 normal, no murmur, click, rub or gallop Extremities: No LEE Pulses: 2+ radials and 1+ DPs Skin: Warm and dry Neurologic: Grossly normal  Lab Results:   Recent Labs  01/16/14 0230 01/17/14 0345  WBC 10.7* 9.0  HGB 12.4 11.6*  HCT 38.3 36.4  PLT 128* 128*   BMET  Recent Labs  01/16/14 0230 01/16/14 1020  NA 140 135*  K 3.6* 4.5  CL 104 101  CO2 25 24  GLUCOSE 105* 148*  BUN 21 18  CREATININE 1.01 0.90  CALCIUM 8.3* 8.6     Assessment/Plan   Active Problems:   ST elevation myocardial infarction (STEMI) involving right coronary artery with complication   s/p extensive PTCA of RCA( no stents).  ASA, Brilinta, statin.  NO ACE or BB due to low BP.  Brilinta recommended for a month then transition to Plavix.     HLD  Statin   CAD  See above   DM  On Amaryl, SS sulin.  CBG stable.   Disposition:  Seen by PT who recommends [facility] PT and intermittent supervision.  Ambulated again today. DC home tomorrow.    LOS: 3 days    HAGER, BRYAN PA-C 01/18/2014 8:21 AM  Agree with above assessment and plan.  The patient denies any chest discomfort.  She is ambulating short distances in the hall. Rhythm remains normal sinus rhythm. Anticipate home on 01/19/14 with home health nurse and physical therapy and occupational therapy.

## 2014-01-18 NOTE — Telephone Encounter (Signed)
error 

## 2014-01-18 NOTE — Progress Notes (Signed)
CARDIAC REHAB PHASE I   PRE:  Rate/Rhythm: 96 SR  BP:  Supine: 106/62  Sitting:   Standing:    SaO2: 94%RA  MODE:  Ambulation: 80 ft   POST:  Rate/Rhythm: 103  BP:  Supine: 100/60  Sitting:   Standing:    SaO2: 94%RA 1412-1435 Pt willing to walk again today. Walked 80 ft on RA with rolling walker and asst x 1. To bed after walk. No c/o CP. Pt knows how to take NTG and stated she would hit the life alert if she had to take a tablet. Tired by end of walk.   Graylon Good, RN BSN  01/18/2014 2:32 PM

## 2014-01-18 NOTE — Progress Notes (Signed)
Physical Therapy Treatment Patient Details Name: [name] MRN: [id number] DOB: [birth year] Today's Date: 01/18/2014    History of Present Illness Adm 01/15/14 with chest pain and underwent cardiac cath with PTCA. Troponin remained elevated throughout 01/15/14 and has not been checked since. PMHx- CAD, MI, stent, CVA, DM, HOH, macular degeneration, ?dementia    PT Comments    Pt admitted with MI. Pt currently with functional limitations due to weakness and decr endurance.  Pt will benefit from skilled PT to increase their independence and safety with mobility to allow discharge to the venue listed below.   Follow Up Recommendations  Home health PT;Supervision - Intermittent     Equipment Recommendations  Wheelchair (measurements PT) (Will need HH to measure and probably needs 16x18 and shorter seat to floor height.)    Recommendations for Other Services OT consult     Precautions / Restrictions Precautions Precautions: Fall Restrictions Weight Bearing Restrictions: No    Mobility  Bed Mobility   Bed Mobility: Supine to Sit     Supine to sit: Supervision        Transfers Overall transfer level: Needs assistance Equipment used: Rolling walker (2 wheeled) Transfers: Sit to/from [facility] Sit to Stand: Supervision Stand pivot transfers: Supervision       General transfer comment: x 4; requires momentum to complete lift-off.  Pt also required some cues to line herself up with 3N1 and recliner.  Pt ambulated and got to 3N1 with PT.  Total assist to clean pt bottom after she used potty.    Ambulation/Gait Ambulation/Gait assistance: Min guard Ambulation Distance (Feet): 100 Feet Assistive device: Rolling walker (2 wheeled) Gait Pattern/deviations: Step-through pattern;Decreased stride length Gait velocity: slow   General Gait Details: pt steady with RW; states she uses hers all the time due to h/o falls   Stairs            Wheelchair  Mobility    Modified Rankin (Stroke Patients Only)       Balance Overall balance assessment: Needs assistance;History of Falls Sitting-balance support: Bilateral upper extremity supported;Feet supported Sitting balance-Leahy Scale: Fair     Standing balance support: Bilateral upper extremity supported;During functional activity Standing balance-Leahy Scale: Poor Standing balance comment: Pt requires use of UEs for standing balance.                      Cognition Arousal/Alertness: Awake/alert Behavior During Therapy: WFL for tasks assessed/performed Overall Cognitive Status: Within Functional Limits for tasks assessed                      Exercises General Exercises - Lower Extremity Ankle Circles/Pumps: AROM;Both;10 reps;Seated Long Arc Quad: AROM;Both;10 reps;Seated Hip Flexion/Marching: AROM;Both;10 reps;Seated    General Comments        Pertinent Vitals/Pain VSS, No pain    Home Living                      Prior Function            PT Goals (current goals can now be found in the care plan section) Progress towards PT goals: Progressing toward goals    Frequency  Min 3X/week    PT Plan Current plan remains appropriate    Co-evaluation             End of Session Equipment Utilized During Treatment: Gait belt Activity Tolerance: Patient tolerated treatment well Patient left: in chair;with call  bell/phone within reach;with chair alarm set     Time: 1015-1040 PT Time Calculation (min): 25 min  Charges:  $Gait Training: 8-22 mins $Therapeutic Exercise: 8-22 mins                    G Codes:      INGOLD,Khalid Lacko 2014-01-31, 11:35 AM [facility] [phone] (pager)

## 2014-01-19 ENCOUNTER — Encounter (HOSPITAL_COMMUNITY): Payer: Self-pay | Admitting: Physician Assistant

## 2014-01-19 DIAGNOSIS — D649 Anemia, unspecified: Secondary | ICD-10-CM | POA: Diagnosis present

## 2014-01-19 DIAGNOSIS — I2510 Atherosclerotic heart disease of native coronary artery without angina pectoris: Secondary | ICD-10-CM | POA: Diagnosis present

## 2014-01-19 DIAGNOSIS — N183 Chronic kidney disease, stage 3 unspecified: Secondary | ICD-10-CM | POA: Diagnosis present

## 2014-01-19 DIAGNOSIS — R570 Cardiogenic shock: Secondary | ICD-10-CM | POA: Diagnosis present

## 2014-01-19 DIAGNOSIS — D696 Thrombocytopenia, unspecified: Secondary | ICD-10-CM | POA: Diagnosis present

## 2014-01-19 DIAGNOSIS — F0390 Unspecified dementia without behavioral disturbance: Secondary | ICD-10-CM | POA: Diagnosis present

## 2014-01-19 LAB — GLUCOSE, CAPILLARY
GLUCOSE-CAPILLARY: 91 mg/dL (ref 70–99)
GLUCOSE-CAPILLARY: 102 mg/dL — AB (ref 70–99)
Glucose-Capillary: 117 mg/dL — ABNORMAL HIGH (ref 70–99)
Glucose-Capillary: 187 mg/dL — ABNORMAL HIGH (ref 70–99)

## 2014-01-19 MED ORDER — CLOPIDOGREL BISULFATE 75 MG PO TABS: 75.0000 mg | ORAL_TABLET | Freq: Every day | ORAL | Status: DC

## 2014-01-19 MED ORDER — METOPROLOL SUCCINATE 12.5 MG HALF TABLET
12.5000 mg | ORAL_TABLET | Freq: Every day | ORAL | Status: DC
  Administered 2014-01-19 – 2014-01-20 (×2): 12.5 mg via ORAL
  Filled 2014-01-19 (×2): qty 1

## 2014-01-19 MED ORDER — TICAGRELOR 90 MG PO TABS: 90.0000 mg | ORAL_TABLET | Freq: Two times a day (BID) | ORAL | Status: DC

## 2014-01-19 MED ORDER — METOPROLOL SUCCINATE ER 25 MG PO TB24
12.5000 mg | ORAL_TABLET | Freq: Every day | ORAL | Status: DC
Start: 2014-01-19 — End: 2014-04-01

## 2014-01-19 MED ORDER — NITROGLYCERIN 0.4 MG SL SUBL: 0.4000 mg | SUBLINGUAL_TABLET | SUBLINGUAL | Status: AC | PRN

## 2014-01-19 MED ORDER — PANTOPRAZOLE SODIUM 40 MG PO TBEC: 40.0000 mg | DELAYED_RELEASE_TABLET | Freq: Every day | ORAL | Status: DC

## 2014-01-19 MED ORDER — ATORVASTATIN CALCIUM 80 MG PO TABS: 80.0000 mg | ORAL_TABLET | Freq: Every evening | ORAL | Status: DC

## 2014-01-19 NOTE — Discharge Summary (Addendum)
Discharge Summary   Patient ID: [name] MRN: [id number], DOB/AGE: 09-09-1921 80 y.o. Admit date: 01/15/2014 D/C date:     01/19/2014  Primary Care Provider: Sherian Maroon, MD Primary Cardiologist: First seen by Dr. Aundra Dubin, cath by Dr. Martinique  Primary Discharge Diagnoses:  1. Inferior STEMI/CAD  - prior hx of RCA stent in 1996 - this admission had extensive PTCA of occluded RCA (no stents); distal LAD and OM1 were chronically occluded; EF 55-60% on echo - plan is for Brilinta x 1 month then transition to Plavix 2. HTN 3. Hyperlipidemia 4. Diabetes mellitus 5. Mild anemia/thrombocytopenia 6. Possible mild dementia 7. CKD stage III 8. Cardiogenic shock, resolved with reperfusion during cath 9. Bradycardia with unspecified AV block, resolved with reperfusion  Secondary Discharge Diagnoses:  1. Vertigo 2. COPD: prior smoker 3. GERD 4. Macular degeneration 5. Hard of hearing 6. Falls 7. Hernia 8. Glaucoma, right eye 9. Asthma 10. Seasonal allergies 11. Blood transfusion reaction history 12. Arthritis 13. Depression 14. Skin cancer of nose 15. Reported history of light stroke 1960s without history of brain bleed (CT heads in the last 5 years did not substantiate prior stroke)  Hospital Course: [name] is an 80 y/o F with history of CAD s/p remote RCA stent 1996, suspected mild dementia, HTN, HL, prior tobacco abuse who presented to [facility] on 01/15/2014 with chest pain. She had substernal chest aching radiating up her neck earlier beginning the afternoon the day prior to admission. She had very severe pain again at 4 am and called EMS. In the ER, HR was in the mid 40s with ECG suggestive of acute inferolateral MI. She underwent emergent cardiac cath showing: 1. Severe 3 vessel obstructive CAD. Chronic occlusion of distal LAD, OM1. 80% first diagonal. 100% acute occlusion of RCA  2. Elevated EDP  3. Successful PTCA only of the RCA   4. Cardiogenic  shock- resolved with reperfusion As above, during cath, the patient was in cardiogenic shock with initial BP in the 13K systolic. HR was in the 40s with AV block. She was given IV atropine and started on IV Levophed at 10 mics, and bolused with NS. Given extensive disease and heavy calcification Dr. Martinique did not think that stenting was appropriate. This would require over 50 mm of stent and he was concerned that the stent could not be fully expanded. With reperfusion and restoration of good flow the patient's BP improved and she was weaned off IV Levophed. Her HR improved with return of AV conduction and her temporary pacing wire was removed. Dual antiplatelet therapy was recommended. 2D Echo showed preserved EF 55-60%. Troponnis were >20. Activity was progressed with cardiac rehab. There was coordination with [facility] who initially recommended rehab placement but the patient adamantly refused to do so and was felt to be in sound state of mind at present to decline. The [facility] program nurse practitioner has recommended she remain in their outpatient program with HHPT, OT and RN which we will arrange at discharge. We will continue home diabetic regimen. On day of d/c, we have added low dose beta blocker. Dr. Mare Ferrari has seen and examined the patient today and feels she is stable for discharge.  She will be given the 30 day Brilinta free card, then a prescription to start Plavix in 1 month. I wrote very clear instructions on her discharge paperwork. - Her last dose of Brilinta should be the evening 9/2. I specifically handwrote the 30  day free RX with 59 tablets to account for having gotten this morning's dose in the hospital. - First day of Plavix is 02/18/14. Dr. Aundra Dubin this admission felt he would use Plavix long-term given significant vascular disease as long as no bleeding problems. - She plans to have her aides help her with her pillboxes and set up the meds correctly.  F/u  issues: - Post cath she had mild anemia/thrombocytopenia but no evidence of bleeding. This can be further monitored as outpatient.  - Will likely need f/u lipids/LFTs in 6 weeks given statin initiation   Discharge Vitals: Blood pressure 118/58, pulse 94, temperature 98.4 F (36.9 C), temperature source Oral, resp. rate 18, height 5\' 1"  (1.549 m), weight 150 lb 9.2 oz (68.3 kg), SpO2 95.00%.  Labs: Lab Results  Component Value Date   WBC 9.0 01/17/2014   HGB 11.6* 01/17/2014   HCT 36.4 01/17/2014   MCV 97.3 01/17/2014   PLT 128* 01/17/2014     Recent Labs Lab 01/16/14 1020  NA 135*  K 4.5  CL 101  CO2 24  BUN 18  CREATININE 0.90  CALCIUM 8.6  GLUCOSE 148*    Lab Results  Component Value Date   CHOL 207* 02/10/2013   HDL 49 02/10/2013   LDLCALC 131* 02/10/2013   TRIG 135 02/10/2013     Diagnostic Studies/Procedures   Cardiac Cath 01/15/14 Cardiac Catheterization Procedure Note  Name: [name]  MRN: [id number]  DOB: 1922/03/16  Procedure: Left Heart Cath, Selective Coronary Angiography, LV pressure measurement, PTCA of the RCA, temporary transvenous pacemaker  Indication: 80 yo WF with history of remote stenting of the proximal RCA presents with an inferior STEMI associated with cardiogenic shock.  Diagnostic Procedure Details: The right groin was prepped, draped, and anesthetized with 1% lidocaine. Using the modified Seldinger technique, a 6 French sheath was introduced into the right femoral artery. Standard Judkins catheters were used for selective coronary angiography and left ventricular pressures. Catheter exchanges were performed over a wire. The diagnostic procedure was well-tolerated without immediate complications.  PROCEDURAL FINDINGS  Hemodynamics: at the end of procedure  AO 124/72 mean 97 mm Hg  LV 118/28 mm Hg  Coronary angiography:  Coronary dominance: right  Left mainstem: Normal.  Left anterior descending (LAD): The LAD is small in caliber throughout the  mid to distal vessel. The distal LAD is occluded and supplied by left to left collaterals. The first diagonal has 80% stenosis proximally.  Left circumflex (LCx): The first OM is occluded proximally with left to left collaterals. The remainder of the LCx has mild nonobstructive disease.  Right coronary artery (RCA): 100% occlusion proximally.  Left ventriculography: Not done.  PCI Procedure Note: Following the diagnostic procedure, the decision was made to proceed with PCI of the RCA. The patient was in cardiogenic shock with initial BP in the 40J systolic. HR was in the 40s with AV block. She was given IV atropine and started on IV Levophed at 10 mics. She was bolused with NS. Weight-based bivalirudin was given for anticoagulation. Brilinta 180 mg was given orally. Once a therapeutic ACT was achieved, a 6 [place] FR4 guide catheter was inserted. A prowater coronary guidewire was used to cross the lesion. The lesion was predilated with a 2.0 mm balloon. The patient remained bradycardic and a 5 Fr venous sheath was inserted in the right femoral vein. A temporary transvenous pacemaker was positioned in the RV apex and the patient was paced. The lesion was then [facility]  with a 2.5 mm noncompliant balloon. There was extensive disease throughout the RCA with thrombus. There was also extensive calcification. The proximal to mid RCA was then ballooned with a 3.0 mm noncompliant balloon. With the heavy calcification there was incomplete balloon expansion but we were able to achieve a good angioplasty result with less than 30% residual and TIMI 3 flow. Given extensive disease and heavy calcification I did not think that stenting was appropriate. This would require over 50 mm of stent and I was concerned that the stent could not be fully expanded. With reperfusion and restoration of good flow the patient's BP improved and she was weaned off IV Levophed. Her HR improved with return of AV conduction and her temporary pacing  wire was removed. She was started on Aggrastat due to heavy thrombus burden. Femoral hemostasis was planned with Manual compression. The patient tolerated the PCI procedure well. There were no immediate procedural complications. The patient was transferred to the ICU for further monitoring.  PCI Data:  Vessel - RCA/Segment - proximal  Percent Stenosis (pre) 100%  TIMI-flow 0  Balloon angioplasty only  Percent Stenosis (post) <30%  TIMI-flow (post) 3  Final Conclusions:  1. Severe 3 vessel obstructive CAD. Chronic occlusion of distal LAD, OM1. 80% first diagonal. 100% acute occlusion of RCA  2. Elevated EDP  3. Successful PTCA only of the RCA  4. Cardiogenic shock- resolved with reperfusion  Recommendations: Continue DAPT. Continue Aggrastat for 18 hours. Will hold beta blocker until BP stabilized.  Peter Martinique, [place]  01/15/2014, 7:02 AM  2D Echo 01/15/14 - Left ventricle: The cavity size was normal. Wall thickness was normal. Systolic function was normal. The estimated ejection fraction was in the range of 55% to 60%. Wall motion was normal; there were no regional wall motion abnormalities.   Discharge Medications   Current Discharge Medication List    START taking these medications   Details  atorvastatin (LIPITOR) 80 MG tablet Take 1 tablet (80 mg total) by mouth every evening. Qty: 30 tablet, Refills: 6    clopidogrel (PLAVIX) 75 MG tablet Take 1 tablet (75 mg total) by mouth daily. Start after you finish your 30 days of Brilinta - you will start taking this medicine on 02/18/14. Qty: 30 tablet, Refills: 6    metoprolol succinate (TOPROL XL) 25 MG 24 hr tablet Take 0.5 tablets (12.5 mg total) by mouth daily. Qty: 30 tablet, Refills: 6    ticagrelor (BRILINTA) 90 MG TABS tablet Take 1 tablet (90 mg total) by mouth 2 (two) times daily. Take for 30 DAYS ONLY - see handwritten prescription. Last dose will be the evening of 02/17/14.      CONTINUE these medications which have  CHANGED   Details  nitroGLYCERIN (NITROSTAT) 0.4 MG SL tablet Place 1 tablet (0.4 mg total) under the tongue every 5 (five) minutes as needed for chest pain (up to 3 doses). Qty: 25 tablet, Refills: 3    pantoprazole (PROTONIX) 40 MG tablet Take 1 tablet (40 mg total) by mouth daily. (has not changed - computer erroneously categorized it)      CONTINUE these medications which have NOT CHANGED   Details  acetaminophen (TYLENOL) 325 MG tablet Take 650 mg by mouth every 6 (six) hours as needed (pain).     aspirin 81 MG EC tablet Take 81 mg by mouth daily.      Cholecalciferol (VITAMIN D3) 2000 UNITS TABS Take 2,000 Units by mouth daily.    docusate sodium (COLACE)  100 MG capsule Take 100 mg by mouth daily.     GLUCERNA (GLUCERNA) LIQD Take 237 mLs by mouth daily. chocolate    glyBURIDE micronized (GLYNASE) 6 MG tablet Take 3 mg by mouth daily with breakfast.    sertraline (ZOLOFT) 100 MG tablet Take 100 mg by mouth daily.      Skin Protectants, Misc. (EUCERIN) cream Apply 1 application topically 2 (two) times daily.    triamcinolone cream (KENALOG) 0.1 % Apply 1 application topically 2 (two) times daily as needed (for legs).      STOP taking these medications     furosemide (LASIX) 20 MG tablet      isosorbide mononitrate (IMDUR) 60 MG 24 hr tablet      magnesium hydroxide (MILK OF MAGNESIA) 400 MG/5ML suspension      metoprolol tartrate (LOPRESSOR) 25 MG tablet      glimepiride (AMARYL) 4 MG tablet (we did not stop med - according to tech this was discontinued prior to admission)          Disposition   The patient will be discharged in stable condition to home. Discharge Instructions   Diet - low sodium heart healthy    Complete by:  As directed      Increase activity slowly    Complete by:  As directed   No driving until cleared by your doctor. No lifting over 10 lbs for 4 weeks. No sexual activity for 4 weeks. Keep procedure site clean & dry. If you notice  increased pain, swelling, bleeding or pus, call/return!  You may shower, but no soaking baths/hot tubs/pools for 1 week.   Regarding your blood thinners, the plan is to: - continue aspirin forever - take Brilinta for only 30 days - last dose will be the night of 02/17/14 - begin taking Plavix the morning of 02/18/14 and continue this until your doctor says to stop          Follow-up Information   Follow up with Richardson Dopp, PA-C. (01/28/14 at 12:10pm at [facility])    Specialty:  Physician Assistant   Contact information:   [place] [address] [place] [address] [phone]         Duration of Discharge Encounter: Greater than 30 minutes including physician and PA time.  Signed, Melina Copa PA-C 01/19/2014, 11:47 AM   The # of pills on the Brilinta prescription was decreased to maintain the original change-over date of 02/18/14 to Plavix.  Infiniti Hoefling 10:54 AM

## 2014-01-19 NOTE — Progress Notes (Signed)
CARDIAC REHAB PHASE I   PRE:  Rate/Rhythm: 95 SR  BP:  Supine:   Sitting: 98/48  Standing:    SaO2: 93%RA  MODE:  Ambulation: 136 ft   POST:  Rate/Rhythm: 112 ST  BP:  Supine:   Sitting: 98/50  Standing:    SaO2: 95%RA 0950-1017 Pt walked 136 ft on RA with rolling walker and asst x 1 with fairly steady gait. Tolerated well. Enjoys walking. To recliner with call bell. Chair alarm on.   Graylon Good, RN BSN  01/19/2014 10:13 AM

## 2014-01-19 NOTE — Care Management Note (Signed)
    Page 1 of 2   01/20/2014     4:45:45 PM CARE MANAGEMENT NOTE 01/20/2014  Patient:  [name], [name]   Account Number:  [id number]  Date Initiated:  01/15/2014  Documentation initiated by:  [facility]  Subjective/Objective Assessment:   Admitted with MI     Action/Plan:   Active with [facility] PTA.   Anticipated DC Date:  01/19/2014   Anticipated DC Plan:  [place]  CM consult      [facility] Choice  HOME HEALTH   Choice offered to / List presented to:  C-1 Patient        Page arranged  HH-1 RN  [place] agency  OTHER - SEE NOTE   Status of service:  Completed, signed off Medicare Important Message given?  YES (If response is "NO", the following Medicare IM given date fields will be blank) Date Medicare IM given:  01/18/2014 Medicare IM given by:  Shanik Brookshire Date Additional Medicare IM given:   Additional Medicare IM given by:    Discharge Disposition:  [place]  Per UR Regulation:  Reviewed for med. necessity/level of care/duration of stay  If discussed at [place] of Stay Meetings, dates discussed:    Comments:  ContactDorethea Clan Daughter   [phone]  01/20/14 Ellan Lambert, RN, BSN [phone] Faxed copy of dc summary to Raquel Sarna, Case Mgr with [facility], who states she will forward to MD,  home care coordinator, and those who arrange pill boxes.  01/19/14 Ellan Lambert, RN, BSN [phone] Pt for dc home today.  Spoke with Raquel Sarna, Case Mgr at [place] [phone]):  she states pt active with [facility] services for Tues -Sat  CNA, and [facility] center visits on Wed and Fri with physical therapist and nurse.  She also sees a physician at the [facility] center, and has a Home Care Coordinator visiting weekly to arrange pill box, assess for home needs.  All services to resume at dc.  Emphasized that pt needs assistance with medication compliance at home. Case Mgr states she will pass  this information to Home Care Coordinator.  Bedside nurse to call [facility] when pt ready for pickup; they can transport home.  Phone [phone].  01/18/14 Ellan Lambert, RN, BSN [phone] Spoke with pt regarding dc plans.  Pt states "I am not going to a nursing home no matter what!  I have been there twice and I won't go back!"  Pt active with [facility] for [facility] follow up and MD follow up.  Will attempt to reach [facility] case mgr to discuss coordination of dc services.

## 2014-01-19 NOTE — Significant Event (Signed)
Pt refusing to go home with [facility] program in place. They were supposed to come get her, help with picking up meds, & help with transportation and outpatient rehab. She has refused residential rehab placement. Daughter cannot pick the patient up until very late tonight after she gets off work from [employer] >8-9pm at which time the pharmacies are likely closed. We need to involve all parties here including the daughter and patient regarding home safety. Do not feel we can just send the patient home without some sort of home help resources in place, particularly with high risk medicines in play. Given lateness of discharge, will keep her for tonight and plan discharge in the morning once care management is able to meet with the patient/daughter to establish a safe plan for discharge. I sat down with the patient this morning to discuss her medication list with her and it was clear to me that she's not really capable of processing this information into real life action.  When she does go home tomorrow, that 30 day Brilinta handwritten script will need to be re-written. I had specifically written it today to prescribe 59 tablets to carry her through the evening of 9/2 (60 tablets minus the 1 she received this AM, under the pretense that she was going to be discharged early afternoon). The rounding team tomorrow will need to supply a new 30 day Brilinta RX adjusting the dispensed tablet amount based on how many doses she receives here in the meantime. I signed out to [facility] about this.   Allure Greaser PA-C

## 2014-01-19 NOTE — Progress Notes (Signed)
Patient: [name] / Admit Date: 01/15/2014 / Date of Encounter: 01/19/2014, 8:38 AM   Subjective: No CP, SOB. Feeling well overall.   Objective: Telemetry: NSR/SA Physical Exam: Blood pressure 118/58, pulse 94, temperature 98.4 F (36.9 C), temperature source Oral, resp. rate 18, height 5\' 1"  (1.549 m), weight 150 lb 9.2 oz (68.3 kg), SpO2 95.00%. General: Well developed, well nourished elderly WF in no acute distress. Head: Normocephalic, atraumatic, sclera non-icteric, no xanthomas, nares are without discharge. Neck: JVP not elevated. Lungs: Clear bilaterally to auscultation without wheezes, rales, or rhonchi. Breathing is unlabored. Heart: RRR S1 S2 without murmurs, rubs, or gallops.  Abdomen: Soft, non-tender, non-distended with normoactive bowel sounds. No rebound/guarding. Extremities: No clubbing or cyanosis. No edema. Distal pedal pulses in tact and equal bilaterally. R groin ecchymosis but no hematoma or bruit Neuro: Alert and oriented X 3. Moves all extremities spontaneously. Psych:  Responds to questions appropriately with a normal affect.   Intake/Output Summary (Last 24 hours) at 01/19/14 0838 Last data filed at 01/19/14 0600  Gross per 24 hour  Intake    600 ml  Output    201 ml  Net    399 ml    Inpatient Medications:  . aspirin  81 mg Oral Daily  . atorvastatin  80 mg Oral q1800  . docusate sodium  100 mg Oral Daily  . glimepiride  4 mg Oral QAC breakfast  . heparin  5,000 Units Subcutaneous 3 times per day  . insulin aspart  0-15 Units Subcutaneous TID WC  . insulin aspart  0-5 Units Subcutaneous QHS  . pantoprazole  40 mg Oral Daily  . sertraline  100 mg Oral Daily  . ticagrelor  90 mg Oral BID   Infusions:    Labs:  Recent Labs  01/16/14 1020  NA 135*  K 4.5  CL 101  CO2 24  GLUCOSE 148*  BUN 18  CREATININE 0.90  CALCIUM 8.6    Recent Labs  01/17/14 0345  WBC 9.0  HGB 11.6*  HCT 36.4  MCV 97.3  PLT 128*   Radiology/Studies:    See cath   Assessment and Plan  1. Inferior STEMI/CAD - prior hx of RCA stent. This admission had Had extensive PTCA of occluded RCA (no stents). Distal LAD and OM1 were chronically occluded. EF 55-60% on echo. Plan is for Brilinta x 1 month then transition to Plavix.  2. HTN - has not been started on BB or ACEI due to soft BPs. BP is stable this AM. Consider low dose Toprol. 3. Hyperlipidemia - will need f/u labs as OP. 4. Diabetes mellitus - continue current regimen. 5. Mild anemia/thrombocytopenia - can monitor as OP. No overt bleeding reported.  Dispo: as patient has probable mild dementia, we are awaiting social work for possible ALF placement. Nursing note from 8/2: "Pt is currently enrolled in [facility]. [facility] currently suggesting [facility] go to [facility] for rehab. [facility] NP states she is concerned about pt going home alone and states pt does not always take all medications. Social work notified and aware." The patient is extremely upset at the idea of not going home. Have asked nursing to please call SW to make sure they come by this morning as I have not seen any notes. Will also involve CM in case she needs to go home with home health.   Will also ask MD if pt needs a baseline CXR. Has not had one this admission, but wouldn't change  management at this time.  Signed, Melina Copa PA-C Patient is seen while walking in the hallway.  She is doing well without chest pain or significant dyspnea.  On examination her lungs are clear and the heart reveals no gallop rhythm. She has had a chest x-ray less than a year ago.  Does not need a repeat chest x-ray at this time. Okay for discharge home later today if arrangements for home health services.

## 2014-01-19 NOTE — Discharge Instructions (Signed)
Please note the multiple medication changes. We are stopping your Lasix and Imdur. We have changed your metoprolol to a different type - this will be a brand new prescription. Please see list for full details. Please have someone review your medications with you to make sure your pillboxes are set up correctly.

## 2014-01-20 LAB — GLUCOSE, CAPILLARY: Glucose-Capillary: 161 mg/dL — ABNORMAL HIGH (ref 70–99)

## 2014-01-20 MED ORDER — TICAGRELOR 90 MG PO TABS: 90.0000 mg | ORAL_TABLET | Freq: Two times a day (BID) | ORAL | Status: DC

## 2014-01-20 NOTE — Progress Notes (Signed)
Subjective: No complaints  Objective: Vital signs in last 24 hours: Temp:  [97.7 F (36.5 C)-97.9 F (36.6 C)] 97.8 F (36.6 C) (08/05 0603) Pulse Rate:  [85-94] 85 (08/05 0603) Resp:  [18-20] 18 (08/05 0603) BP: (114-120)/(57-69) 115/57 mmHg (08/05 0603) SpO2:  [95 %-99 %] 95 % (08/05 0603) Last BM Date: 01/19/14  Intake/Output from previous day: 08/04 0701 - 08/05 0700 In: 480 [P.O.:480] Out: 300 [Urine:300] Intake/Output this shift: Total I/O In: 240 [P.O.:240] Out: -   Medications Current Facility-Administered Medications  Medication Dose Route Frequency Provider Last Rate Last Dose  . acetaminophen (TYLENOL) tablet 650 mg  650 mg Oral Q4H PRN Peter M Martinique, MD      . aspirin chewable tablet 81 mg  81 mg Oral Daily Peter M Martinique, MD   81 mg at 01/19/14 1249  . atorvastatin (LIPITOR) tablet 80 mg  80 mg Oral q1800 Peter M Martinique, MD   80 mg at 01/19/14 1750  . bisacodyl (DULCOLAX) EC tablet 5 mg  5 mg Oral Daily PRN Rogelia Mire, NP   5 mg at 01/17/14 0739  . diazepam (VALIUM) tablet 2 mg  2 mg Oral Q8H PRN Amy D Clegg, NP   2 mg at 01/15/14 1442  . docusate sodium (COLACE) capsule 100 mg  100 mg Oral Daily Peter M Martinique, MD   100 mg at 01/18/14 1002  . fentaNYL (SUBLIMAZE) injection 25 mcg  25 mcg Intravenous Q2H PRN Jolaine Artist, MD   25 mcg at 01/15/14 1130  . glimepiride (AMARYL) tablet 4 mg  4 mg Oral QAC breakfast Peter M Martinique, MD   4 mg at 01/20/14 2979  . heparin injection 5,000 Units  5,000 Units Subcutaneous 3 times per day Peter M Martinique, MD   5,000 Units at 01/20/14 [phone]  . insulin aspart (novoLOG) injection 0-15 Units  0-15 Units Subcutaneous TID WC Larey Dresser, MD   3 Units at 01/18/14 1726  . insulin aspart (novoLOG) injection 0-5 Units  0-5 Units Subcutaneous QHS Jolaine Artist, MD   3 Units at 01/19/14 2240  . metoprolol succinate (TOPROL-XL) 24 hr tablet 12.5 mg  12.5 mg Oral Daily Dayna N Dunn, PA-C   12.5 mg at 01/19/14 1750   . nitroGLYCERIN (NITROSTAT) SL tablet 0.4 mg  0.4 mg Sublingual Q5 min PRN Peter M Martinique, MD      . ondansetron [facility]) injection 4 mg  4 mg Intravenous Q6H PRN Peter M Martinique, MD   4 mg at 01/15/14 1210  . pantoprazole (PROTONIX) EC tablet 40 mg  40 mg Oral Daily Peter M Martinique, MD   40 mg at 01/19/14 1249  . sertraline (ZOLOFT) tablet 100 mg  100 mg Oral Daily Peter M Martinique, MD   100 mg at 01/19/14 1249  . sodium phosphate (FLEET) 7-19 GM/118ML enema 1 enema  1 enema Rectal Daily PRN Rogelia Mire, NP   1 enema at 01/16/14 1720  . ticagrelor (BRILINTA) tablet 90 mg  90 mg Oral BID Peter M Martinique, MD   90 mg at 01/19/14 2239    PE: General appearance: alert, cooperative and no distress Lungs: clear to auscultation bilaterally Heart: regular rate and rhythm, S1, S2 normal, no murmur, click, rub or gallop Extremities: No LEE Pulses: 2+ and symmetric Skin: Warm and dry Neurologic: Grossly normal   Assessment/Plan    Principal Problem:   ST elevation myocardial infarction (STEMI) involving right coronary artery with complication  Active Problems:   DIABETES MELLITUS, II, COMPLICATIONS   HYPERCHOLESTEROLEMIA   Essential hypertension, benign   CAD (coronary artery disease)   CKD (chronic kidney disease), stage III   Mild dementia   Thrombocytopenia   Anemia   Cardiogenic shock  Plan:  [place] home today.  Brilinta script changed to reflect change over to plavix on 9/3.     LOS: 5 days    HAGER, BRYAN PA-C 01/20/2014 10:21 AM The patient is in good spirits today.  Her daughter is here to take her home.  She did well with ambulation in the hall. Plan: Discharge home today on Brilinta

## 2014-01-21 LAB — GLUCOSE, CAPILLARY: Glucose-Capillary: 69 mg/dL — ABNORMAL LOW (ref 70–99)

## 2014-01-28 ENCOUNTER — Ambulatory Visit (INDEPENDENT_AMBULATORY_CARE_PROVIDER_SITE_OTHER): Payer: Medicare (Managed Care) | Admitting: Physician Assistant

## 2014-01-28 ENCOUNTER — Encounter: Payer: Self-pay | Admitting: Physician Assistant

## 2014-01-28 VITALS — BP 110/60 | HR 88 | Ht 61.0 in | Wt 137.0 lb

## 2014-01-28 DIAGNOSIS — E785 Hyperlipidemia, unspecified: Secondary | ICD-10-CM

## 2014-01-28 DIAGNOSIS — R0602 Shortness of breath: Secondary | ICD-10-CM

## 2014-01-28 DIAGNOSIS — I10 Essential (primary) hypertension: Secondary | ICD-10-CM

## 2014-01-28 DIAGNOSIS — I2510 Atherosclerotic heart disease of native coronary artery without angina pectoris: Secondary | ICD-10-CM

## 2014-01-28 LAB — BASIC METABOLIC PANEL
BUN: 14 mg/dL (ref 6–23)
CO2: 31 mEq/L (ref 19–32)
CREATININE: 0.9 mg/dL (ref 0.4–1.2)
Calcium: 9.4 mg/dL (ref 8.4–10.5)
Chloride: 99 mEq/L (ref 96–112)
GFR: 60.23 mL/min (ref >60.00)
Glucose, Bld: 171 mg/dL — ABNORMAL HIGH (ref 70–99)
POTASSIUM: 4 meq/L (ref 3.5–5.1)
Sodium: 139 mEq/L (ref 135–145)

## 2014-01-28 LAB — BRAIN NATRIURETIC PEPTIDE: PRO B NATRI PEPTIDE: 271 pg/mL — AB (ref 0.0–100.0)

## 2014-01-28 MED ORDER — CLOPIDOGREL BISULFATE 75 MG PO TABS: 75.0000 mg | ORAL_TABLET | Freq: Every day | ORAL | Status: DC

## 2014-01-28 MED ORDER — ROSUVASTATIN CALCIUM 10 MG PO TABS: ORAL_TABLET | ORAL | Status: DC

## 2014-01-28 NOTE — Progress Notes (Signed)
Cardiology Office Note    Date:  01/28/2014   ID:  [name], DOB Apr 09, 1922, MRN [id number]  PCP:  Joanna Maroon, MD  Cardiologist:  Dr. Loralie Bailey      History of Present Illness: [name] is a 80 y.o. female with a hx of CAD s/p MI with stent to RCA in 1996, HL, HTN, COPD, GERD, DM2, possible dementia.  She was admitted 7/31-84 with acute inferolateral STEMI c/b cardiogenic shock and HR in the 40s.  Emergent cath demonstrated 3v CAD with CTO of the dist LAD and OM1, D1 80% and acute occlusion of the RCA.  RCA tx with POBA with resolution of shock and improved HR.  Echo demonstrated EF 55-60%.  It was recommended she remain on Brilinta x 30 days, then transition to Plavix long-term.    [facility].  However, she has not been able to travel there due to being weak.  She returns for FU with her daughter.  She notes that she has chronic DOE.  She does feel her breathing is somewhat worse.  She feels weak.  She thinks her legs are weak from the Lipitor.  She denies orthopnea, PND, edema.  She denies syncope.  She denies chest pain.    Studies:  - LHC (7/15):  Dist LAD occl (L-L collats), prox D1 80%, prox OM1 occl (L-L collats), prox RCA occl >>> PCI:  POBA to prox RCA  - Echo (7/15):  EF 55-60%, no RWMA  Recent Labs/Images: 02/09/2013: Pro B Natriuretic peptide (BNP) 318.7  02/10/2013: ALT 15; HDL Cholesterol by NMR 49; LDL (calc) 131*  01/16/2014: Creatinine 0.90; Potassium 4.5  01/17/2014: Hemoglobin 11.6*   No results found.   Wt Readings from Last 3 Encounters:  01/28/14 137 lb (62.143 kg)  01/15/14 150 lb 9.2 oz (68.3 kg)  01/15/14 150 lb 9.2 oz (68.3 kg)     Past Medical History  Diagnosis Date  . Vertigo   . Hernia   . CKD (chronic kidney disease), stage III     a. "one kidney rotted" (02/09/2013). (?) b. likely stage III by historical bloodwork.  . Glaucoma, right eye   . GERD (gastroesophageal reflux disease)   .  Asthma   . Seasonal allergies   . Type II diabetes mellitus   . Blood transfusion reaction   . Arthritis     "all over" (02/09/2013)  . Depression   . Skin cancer of nose     "left side" (02/09/2013)  . Stroke 1960's    "a light one" (02/09/2013)  . Falls frequently   . CAD (coronary artery disease)     a. prior hx of RCA stent in 1996. b. 12/2013: inferior STEMI s/p extensive PTCA only of occluded RCA (no stents); distal LAD and OM were chronically occluded, EF 55-60%.  Marland Kitchen HTN (hypertension)   . Hyperlipidemia   . Anemia     a. Mild, noted on bloodwork 12/2013.  Marland Kitchen Thrombocytopenia     a. Mild, noted on bloodwork 12/2013.  . Mild dementia   . Cardiogenic shock     a. 12/2013: during STEMI with SBP 60s, AV block/bradycardia, resolved with reperfusion.    Current Outpatient Prescriptions  Medication Sig Dispense Refill  . acetaminophen (TYLENOL) 325 MG tablet Take 650 mg by mouth every 6 (six) hours as needed (pain).       Marland Kitchen aspirin 81 MG EC tablet Take 81 mg by mouth daily.        Marland Kitchen  Cholecalciferol (VITAMIN D3) 2000 UNITS TABS Take 2,000 Units by mouth daily.      Derrill Memo ON 02/18/2014] clopidogrel (PLAVIX) 75 MG tablet Take 1 tablet (75 mg total) by mouth daily. Start after you finish your 30 days of Brilinta - you will start taking this medicine on 02/18/14.  30 tablet  6  . docusate sodium (COLACE) 100 MG capsule Take 100 mg by mouth daily.       Marland Kitchen GLUCERNA (GLUCERNA) LIQD Take 237 mLs by mouth daily. chocolate      . glyBURIDE micronized (GLYNASE) 6 MG tablet Take 3 mg by mouth daily with breakfast.      . metoprolol succinate (TOPROL XL) 25 MG 24 hr tablet Take 0.5 tablets (12.5 mg total) by mouth daily.  30 tablet  6  . nitroGLYCERIN (NITROSTAT) 0.4 MG SL tablet Place 1 tablet (0.4 mg total) under the tongue every 5 (five) minutes as needed for chest pain (up to 3 doses).  25 tablet  3  . pantoprazole (PROTONIX) 40 MG tablet Take 1 tablet (40 mg total) by mouth daily.      . sertraline  (ZOLOFT) 100 MG tablet Take 100 mg by mouth daily.        . Skin Protectants, Misc. (EUCERIN) cream Apply 1 application topically 2 (two) times daily.      . ticagrelor (BRILINTA) 90 MG TABS tablet Take 1 tablet (90 mg total) by mouth 2 (two) times daily. Take for 30 DAYS ONLY -  Last dose will be the evening of 02/17/14.  57 tablet  0  . triamcinolone cream (KENALOG) 0.1 % Apply 1 application topically 2 (two) times daily as needed (for legs).      Marland Kitchen atorvastatin (LIPITOR) 80 MG tablet Take 1 tablet (80 mg total) by mouth every evening.  30 tablet  6   No current facility-administered medications for this visit.     Allergies:   Fluticasone-salmeterol   Social History:  The patient  reports that she quit smoking about 40 years ago. Her smoking use included Cigarettes. She has a 96 pack-year smoking history. She has never used smokeless tobacco. She reports that she does not drink alcohol or use illicit drugs.   Family History:  The patient's family history includes Cancer in her father and mother; Diabetes in her brother and son; Stroke in her daughter and son. There is no history of Heart attack.   ROS:  Please see the history of present illness.   She has a NP cough.   All other systems reviewed and negative.   PHYSICAL EXAM: VS:  BP 110/60  Pulse 88  Ht 5\' 1"  (1.549 m)  Wt 137 lb (62.143 kg)  BMI 25.90 kg/m2 Well nourished, well developed, in no acute distress HEENT: normal Neck: no JVD Cardiac:  normal S1, S2; RRR; no murmur Lungs:  Decreased breath sounds bilaterally, no wheezing, rhonchi or rales Abd: soft, nontender, no hepatomegaly Ext: no edemaright groin without hematoma or bruit  Skin: warm and dry Neuro:  CNs 2-12 intact, no focal abnormalities noted  EKG:  NSR, HR 88, LAD, low voltage, inf-lat TWI     ASSESSMENT AND PLAN:  Coronary artery disease:    Doing ok after recent MI.  She has extensive CAD with CTO of LAD and OM.  RCA was the culprit for her MI and was  treated with POBA.  She is weak and is not able to get to the services provided by [facility].  We will contact the facility to see if there is a way to come to her house.  If not, we can check into HHPT to see if that is a possibility.  She needs to be more mobile.  Continue ASA, statin, beta blocker.    Shortness of breath:  I suspect this is related to the Brilinta.  I reviewed this with Dr. Loralie Bailey by phone.  I will DC Brilinta.  Start Plavix 75 QD.  Check BMET and BNP.  EF preserved at time of MI by echo.  She does not look volume overloaded. She has chronic dyspnea from probable COPD.    Essential hypertension, benign:  controlled  Other and unspecified hyperlipidemia:  She is c/o leg pain that she thinks is from the Lipitor.  She has had this in the past.  DC Lipitor.  Start Crestor 10 mg on Mon, Wed, Fri.    Disposition:  FU with Dr. Loralie Bailey in 4 weeks.    Signed, Versie Starks, MHS 01/28/2014 12:43 PM    [facility] [place], [place], [place]  [address] Phone: [phone]; Fax: [phone]

## 2014-01-28 NOTE — Patient Instructions (Addendum)
TAKE YOUR LAST DOSE OF BRILINTA TONIGHT 02/28/14  03/01/14 YOU WILL START ON PLAVIX 75 MG DAILY  STOP LIPITOR  AFTER ABOUT 1 WEEK OF BEING OFF THE LIPITOR YOU WILL NEED TO START CRESTOR 10 MG AT BEDTIME ONLY ON MON, WED, and FRI'S  LAB WORK TODAY; BMET, BNP  Your physician recommends that you schedule a follow-up appointment in: [place] DR. Aundra Dubin

## 2014-01-29 ENCOUNTER — Telehealth: Payer: Self-pay | Admitting: *Deleted

## 2014-01-29 NOTE — Telephone Encounter (Signed)
Daughter Izora Gala aware of lab results with verbal understanding to results and Plan of Care.

## 2014-03-15 ENCOUNTER — Telehealth: Payer: Self-pay | Admitting: Cardiology

## 2014-03-15 NOTE — Telephone Encounter (Signed)
Appointment:    Per pt's daughter pt is not feeling well 9/29 wants to reschedule please give her a call.  She does apologize.

## 2014-03-15 NOTE — Telephone Encounter (Signed)
Spoke with [name], appt rescheduled.

## 2014-03-16 ENCOUNTER — Ambulatory Visit: Payer: Medicare (Managed Care) | Admitting: Cardiology

## 2014-03-22 ENCOUNTER — Ambulatory Visit: Payer: Medicare (Managed Care) | Admitting: Cardiology

## 2014-04-01 ENCOUNTER — Encounter: Payer: Self-pay | Admitting: Physician Assistant

## 2014-04-01 ENCOUNTER — Ambulatory Visit (INDEPENDENT_AMBULATORY_CARE_PROVIDER_SITE_OTHER): Payer: Medicare (Managed Care) | Admitting: Physician Assistant

## 2014-04-01 VITALS — BP 127/70 | HR 95 | Ht 61.0 in | Wt 135.0 lb

## 2014-04-01 DIAGNOSIS — Z23 Encounter for immunization: Secondary | ICD-10-CM

## 2014-04-01 DIAGNOSIS — I10 Essential (primary) hypertension: Secondary | ICD-10-CM

## 2014-04-01 DIAGNOSIS — E7800 Pure hypercholesterolemia, unspecified: Secondary | ICD-10-CM

## 2014-04-01 DIAGNOSIS — I2510 Atherosclerotic heart disease of native coronary artery without angina pectoris: Secondary | ICD-10-CM

## 2014-04-01 LAB — LIPID PANEL
Cholesterol: 240 mg/dL — ABNORMAL HIGH (ref 0–200)
HDL: 42.6 mg/dL (ref >39.00)
LDL Cholesterol: 176 mg/dL — ABNORMAL HIGH (ref 0–99)
NONHDL: 197.4
Total CHOL/HDL Ratio: 6
Triglycerides: 107 mg/dL (ref 0.0–149.0)
VLDL: 21.4 mg/dL (ref 0.0–40.0)

## 2014-04-01 LAB — HEPATIC FUNCTION PANEL
ALK PHOS: 91 U/L (ref 39–117)
ALT: 14 U/L (ref 0–35)
AST: 17 U/L (ref 0–37)
Albumin: 3.4 g/dL — ABNORMAL LOW (ref 3.5–5.2)
BILIRUBIN DIRECT: 0.1 mg/dL (ref 0.0–0.3)
BILIRUBIN TOTAL: 0.9 mg/dL (ref 0.2–1.2)
Total Protein: 7.3 g/dL (ref 6.0–8.3)

## 2014-04-01 MED ORDER — METOPROLOL SUCCINATE ER 25 MG PO TB24: 25.0000 mg | ORAL_TABLET | Freq: Every day | ORAL | Status: DC

## 2014-04-01 NOTE — Patient Instructions (Signed)
Your physician has recommended you make the following change in your medication:  1. INCREASE TOPROL XL TO 25 MG DAILY; NEW RX SENT IN   LAB WORK TODAY; LIPID AND LIVER PANEL  Your physician wants you to follow-up in: 3 MONTHS WITH DR. Aundra Dubin . You will receive a reminder letter in the mail two months in advance. If you don't receive a letter, please call our office to schedule the follow-up appointment.  YOU GOT A FLU SHOT TODAY

## 2014-04-01 NOTE — Progress Notes (Signed)
Cardiology Office Note   Date:  04/01/2014   ID:  [name], DOB [birth year], MRN [id number]  PCP:  Sherian Maroon, MD  Cardiologist:  Dr. Loralie Champagne      History of Present Illness: [name] is a 80 y.o. female with a hx of CAD s/p MI with stent to RCA in 1996, HL, HTN, COPD, GERD, DM2, possible dementia.  She was admitted 12/2013 with acute inferolateral STEMI c/b cardiogenic shock and HR in the 40s.  Emergent cath demonstrated 3v CAD with CTO of the dist LAD and OM1, D1 80% and acute occlusion of the RCA.  RCA tx with POBA with resolution of shock and improved HR.  Echo demonstrated EF 55-60%.  It was recommended she remain on Brilinta x 30 days, then transition to Plavix long-term.    I saw her 01/28/14 for follow up post hospital. She remained weak. She was unable to get to [facility].  We tried to see if there was a way for someone to come to her house. I changed her Brilinta to Plavix due to dyspnea. BNP was not significantly elevated. She complained of leg pain and her Lipitor was changed to Crestor 10 mg Monday, Wednesday, Friday.  She returns for FU.    [facility] is coming to her house 1 x a month.  She has an aide that comes 5 days a week.  Her strength is slightly better.  Breathing is somewhat better on Plavix and off of Brilinta.  She has chronic dyspnea.  She notes occ chest pain.  This is hard to describe.  She notes bilateral shoulder pain.  This seems to be from her DJD.  She denies syncope.  No orthopnea, PND or significant pedal edema.    Studies:  - LHC (7/15):  Dist LAD occl (L-L collats), prox D1 80%, prox OM1 occl (L-L collats), prox RCA occl >>> PCI:  POBA to prox RCA  - Echo (7/15):  EF 55-60%, no RWMA  Recent Labs/Images: 01/17/2014: Hemoglobin 11.6*  01/28/2014: Creatinine 0.9; Potassium 4.0; Pro B Natriuretic peptide (BNP) 271.0*   Wt Readings from Last 3 Encounters:  04/01/14 135 lb (61.236 kg)  01/28/14 137 lb (62.143 kg)  01/15/14 150 lb 9.2 oz  (68.3 kg)     Past Medical History  Diagnosis Date  . Vertigo   . Hernia   . CKD (chronic kidney disease), stage III     a. "one kidney rotted" (02/09/2013). (?) b. likely stage III by historical bloodwork.  . Glaucoma, right eye   . GERD (gastroesophageal reflux disease)   . Asthma   . Seasonal allergies   . Type II diabetes mellitus   . Blood transfusion reaction   . Arthritis     "all over" (02/09/2013)  . Depression   . Skin cancer of nose     "left side" (02/09/2013)  . Stroke 1960's    "a light one" (02/09/2013)  . Falls frequently   . CAD (coronary artery disease)     a. prior hx of RCA stent in 1996. b. 12/2013: inferior STEMI s/p extensive PTCA only of occluded RCA (no stents); distal LAD and OM were chronically occluded, EF 55-60%.  Marland Kitchen HTN (hypertension)   . Hyperlipidemia   . Anemia     a. Mild, noted on bloodwork 12/2013.  Marland Kitchen Thrombocytopenia     a. Mild, noted on bloodwork 12/2013.  . Mild dementia   . Cardiogenic shock     a. 12/2013: during  STEMI with SBP 60s, AV block/bradycardia, resolved with reperfusion.    Current Outpatient Prescriptions  Medication Sig Dispense Refill  . acetaminophen (TYLENOL) 325 MG tablet Take 650 mg by mouth every 6 (six) hours as needed (pain).       Marland Kitchen aspirin 81 MG EC tablet Take 81 mg by mouth daily.        . Cholecalciferol (VITAMIN D3) 2000 UNITS TABS Take 2,000 Units by mouth daily.      . clopidogrel (PLAVIX) 75 MG tablet Take 1 tablet (75 mg total) by mouth daily.  30 tablet  11  . docusate sodium (COLACE) 100 MG capsule Take 100 mg by mouth daily.       Marland Kitchen GLUCERNA (GLUCERNA) LIQD Take 237 mLs by mouth daily. chocolate      . glyBURIDE micronized (GLYNASE) 6 MG tablet Take 3 mg by mouth daily with breakfast.      . metoprolol succinate (TOPROL XL) 25 MG 24 hr tablet Take 0.5 tablets (12.5 mg total) by mouth daily.  30 tablet  6  . nitroGLYCERIN (NITROSTAT) 0.4 MG SL tablet Place 1 tablet (0.4 mg total) under the tongue every 5  (five) minutes as needed for chest pain (up to 3 doses).  25 tablet  3  . pantoprazole (PROTONIX) 40 MG tablet Take 1 tablet (40 mg total) by mouth daily.      . rosuvastatin (CRESTOR) 10 MG tablet TAKE 10 MG ON MON, WED and FRI'S ONLY  30 tablet  11  . sertraline (ZOLOFT) 100 MG tablet Take 100 mg by mouth daily.        . Skin Protectants, Misc. (EUCERIN) cream Apply 1 application topically 2 (two) times daily.      Marland Kitchen triamcinolone cream (KENALOG) 0.1 % Apply 1 application topically 2 (two) times daily as needed (for legs).       No current facility-administered medications for this visit.     Allergies:   Fluticasone-salmeterol   Social History:  The patient  reports that she quit smoking about 40 years ago. Her smoking use included Cigarettes. She has a 96 pack-year smoking history. She has never used smokeless tobacco. She reports that she does not drink alcohol or use illicit drugs.   Family History:  The patient's family history includes Cancer in her father and mother; Diabetes in her brother and son; Stroke in her daughter and son. There is no history of Heart attack.   ROS:  Please see the history of present illness.    All other systems reviewed and negative.   PHYSICAL EXAM: VS:  BP 127/70  Pulse 95  Ht 5\' 1"  (1.549 m)  Wt 135 lb (61.236 kg)  BMI 25.52 kg/m2  SpO2 93% Well nourished, well developed, in no acute distress HEENT: normal Neck: no JVDat 90 Cardiac:  normal S1, S2; RRR; no murmur Lungs:  Decreased breath sounds bilaterally, no wheezing, rhonchi or rales Abd: soft, nontender, no hepatomegaly Ext: no edema  Skin: warm and dry Neuro:  CNs 2-12 intact, no focal abnormalities noted  EKG:  NSR, HR 95, normal axis, nonspecific ST-T wave changes    ASSESSMENT AND PLAN:  1. Coronary artery disease:   She appears to be doing well. She does have occasional chest pains. She does not feel this is like her previous MI. Question if some of this may be related to  angina from her residual disease. She does have significant residual disease with chronically occluded distal LAD and proximal OM1  with collaterals. I will increase her metoprolol XL to 25 mg daily. Her blood pressure is somewhat soft.  We could consider adding long-acting nitrates but would need to use a very low dose. Continue ASA, Plavix, statin.  2. Essential hypertension, benign:   Controlled.   3. Other and unspecified hyperlipidemia:  Continue statin.  Check Lipids and LFTs today as she is fasting.    Disposition:  FU with Dr. Loralie Champagne in 3 mos.    Signed, Versie Starks, MHS 04/01/2014 12:06 PM    [facility] [place], [place], [place]  [address] Phone: [phone]; Fax: [phone]

## 2014-04-06 ENCOUNTER — Telehealth: Payer: Self-pay | Admitting: *Deleted

## 2014-04-06 DIAGNOSIS — E785 Hyperlipidemia, unspecified: Secondary | ICD-10-CM

## 2014-04-06 MED ORDER — ROSUVASTATIN CALCIUM 10 MG PO TABS: ORAL_TABLET | ORAL | Status: DC

## 2014-04-06 NOTE — Telephone Encounter (Signed)
pt and her daughter notified about lab results and to increase crestor to every day. Daughter states she will cb to schedule FLP/LFT.

## 2014-05-12 ENCOUNTER — Emergency Department (HOSPITAL_COMMUNITY)
Admission: EM | Admit: 2014-05-12 | Discharge: 2014-05-12 | Disposition: A | Discharge status: Home / Self Care | Payer: Medicare (Managed Care) | Attending: Emergency Medicine | Admitting: Emergency Medicine

## 2014-05-12 ENCOUNTER — Emergency Department (HOSPITAL_COMMUNITY): Payer: Medicare (Managed Care)

## 2014-05-12 ENCOUNTER — Encounter (HOSPITAL_COMMUNITY): Payer: Self-pay | Admitting: Emergency Medicine

## 2014-05-12 DIAGNOSIS — M1389 Other specified arthritis, multiple sites: Secondary | ICD-10-CM | POA: Insufficient documentation

## 2014-05-12 DIAGNOSIS — Z79899 Other long term (current) drug therapy: Secondary | ICD-10-CM | POA: Diagnosis not present

## 2014-05-12 DIAGNOSIS — Z8669 Personal history of other diseases of the nervous system and sense organs: Secondary | ICD-10-CM | POA: Diagnosis not present

## 2014-05-12 DIAGNOSIS — I129 Hypertensive chronic kidney disease with stage 1 through stage 4 chronic kidney disease, or unspecified chronic kidney disease: Secondary | ICD-10-CM | POA: Diagnosis not present

## 2014-05-12 DIAGNOSIS — S0990XA Unspecified injury of head, initial encounter: Secondary | ICD-10-CM | POA: Insufficient documentation

## 2014-05-12 DIAGNOSIS — W1839XA Other fall on same level, initial encounter: Secondary | ICD-10-CM | POA: Diagnosis not present

## 2014-05-12 DIAGNOSIS — F329 Major depressive disorder, single episode, unspecified: Secondary | ICD-10-CM | POA: Diagnosis not present

## 2014-05-12 DIAGNOSIS — Z9861 Coronary angioplasty status: Secondary | ICD-10-CM | POA: Insufficient documentation

## 2014-05-12 DIAGNOSIS — E119 Type 2 diabetes mellitus without complications: Secondary | ICD-10-CM | POA: Diagnosis not present

## 2014-05-12 DIAGNOSIS — Y92009 Unspecified place in unspecified non-institutional (private) residence as the place of occurrence of the external cause: Secondary | ICD-10-CM | POA: Diagnosis not present

## 2014-05-12 DIAGNOSIS — J45909 Unspecified asthma, uncomplicated: Secondary | ICD-10-CM | POA: Insufficient documentation

## 2014-05-12 DIAGNOSIS — Z9181 History of falling: Secondary | ICD-10-CM | POA: Diagnosis not present

## 2014-05-12 DIAGNOSIS — Z85828 Personal history of other malignant neoplasm of skin: Secondary | ICD-10-CM | POA: Diagnosis not present

## 2014-05-12 DIAGNOSIS — N183 Chronic kidney disease, stage 3 (moderate): Secondary | ICD-10-CM | POA: Insufficient documentation

## 2014-05-12 DIAGNOSIS — Z7982 Long term (current) use of aspirin: Secondary | ICD-10-CM | POA: Insufficient documentation

## 2014-05-12 DIAGNOSIS — I2510 Atherosclerotic heart disease of native coronary artery without angina pectoris: Secondary | ICD-10-CM | POA: Insufficient documentation

## 2014-05-12 DIAGNOSIS — S0012XA Contusion of left eyelid and periocular area, initial encounter: Secondary | ICD-10-CM | POA: Diagnosis not present

## 2014-05-12 DIAGNOSIS — Z23 Encounter for immunization: Secondary | ICD-10-CM | POA: Diagnosis not present

## 2014-05-12 DIAGNOSIS — H05232 Hemorrhage of left orbit: Secondary | ICD-10-CM

## 2014-05-12 DIAGNOSIS — F0390 Unspecified dementia without behavioral disturbance: Secondary | ICD-10-CM | POA: Insufficient documentation

## 2014-05-12 DIAGNOSIS — W19XXXA Unspecified fall, initial encounter: Secondary | ICD-10-CM

## 2014-05-12 DIAGNOSIS — K219 Gastro-esophageal reflux disease without esophagitis: Secondary | ICD-10-CM | POA: Insufficient documentation

## 2014-05-12 DIAGNOSIS — Y9389 Activity, other specified: Secondary | ICD-10-CM | POA: Insufficient documentation

## 2014-05-12 DIAGNOSIS — Z862 Personal history of diseases of the blood and blood-forming organs and certain disorders involving the immune mechanism: Secondary | ICD-10-CM | POA: Diagnosis not present

## 2014-05-12 DIAGNOSIS — S0993XA Unspecified injury of face, initial encounter: Secondary | ICD-10-CM | POA: Diagnosis present

## 2014-05-12 DIAGNOSIS — Z7902 Long term (current) use of antithrombotics/antiplatelets: Secondary | ICD-10-CM | POA: Insufficient documentation

## 2014-05-12 DIAGNOSIS — Z87891 Personal history of nicotine dependence: Secondary | ICD-10-CM | POA: Insufficient documentation

## 2014-05-12 DIAGNOSIS — E785 Hyperlipidemia, unspecified: Secondary | ICD-10-CM | POA: Insufficient documentation

## 2014-05-12 DIAGNOSIS — Y998 Other external cause status: Secondary | ICD-10-CM | POA: Diagnosis not present

## 2014-05-12 MED ORDER — HYDROCODONE-ACETAMINOPHEN 5-325 MG PO TABS: 0.5000 | ORAL_TABLET | Freq: Four times a day (QID) | ORAL | Status: DC | PRN

## 2014-05-12 MED ORDER — TETANUS-DIPHTH-ACELL PERTUSSIS 5-2.5-18.5 LF-MCG/0.5 IM SUSP
0.5000 mL | Freq: Once | INTRAMUSCULAR | Status: AC
  Administered 2014-05-12: 0.5 mL via INTRAMUSCULAR
  Filled 2014-05-12: qty 0.5

## 2014-05-12 MED ORDER — HYDROCODONE-ACETAMINOPHEN 5-325 MG PO TABS
1.0000 | ORAL_TABLET | Freq: Once | ORAL | Status: AC
  Administered 2014-05-12: 1 via ORAL
  Filled 2014-05-12: qty 1

## 2014-05-12 NOTE — ED Notes (Signed)
Moureen Cavanaugh RN, [phone] x1 on call nurse to give an update on pt's status if being admitted or discharge.

## 2014-05-12 NOTE — Progress Notes (Signed)
CSW went to speak with Pt at bedside. There was no family present. Physician was present at bedside. Pt informed CSW that she fell down on the concrete at her home today while attempting to pick up a newspaper. The Pt says she lives alone, but has 2 nurse aids that come to help her. The Pt states that she has been living alone for the last 7 years since her husband passed away. However, she feels she has a good support system with her neighbors who often come and check on her.    The Pt has a large visible bruise present on the left side of her face. The Pt states that on a scale from 1-10, with 10 being the most painful her pain has "calmed down" and is now at a 5.   [name] [phone] ED CSW 05/12/2014 6:42 PM

## 2014-05-12 NOTE — Discharge Instructions (Signed)
Fall Prevention and Home Safety Falls cause injuries and can affect all age groups. It is possible to use preventive measures to significantly decrease the likelihood of falls. There are many simple measures which can make your home safer and prevent falls. OUTDOORS  Repair cracks and edges of walkways and driveways.  Remove high doorway thresholds.  Trim shrubbery on the main path into your home.  Have good outside lighting.  Clear walkways of tools, rocks, debris, and clutter.  Check that handrails are not broken and are securely fastened. Both sides of steps should have handrails.  Have leaves, snow, and ice cleared regularly.  Use sand or salt on walkways during winter months.  In the garage, clean up grease or oil spills. BATHROOM  Install night lights.  Install grab bars by the toilet and in the tub and shower.  Use non-skid mats or decals in the tub or shower.  Place a plastic non-slip stool in the shower to sit on, if needed.  Keep floors dry and clean up all water on the floor immediately.  Remove soap buildup in the tub or shower on a regular basis.  Secure bath mats with non-slip, double-sided rug tape.  Remove throw rugs and tripping hazards from the floors. BEDROOMS  Install night lights.  Make sure a bedside light is easy to reach.  Do not use oversized bedding.  Keep a telephone by your bedside.  Have a firm chair with side arms to use for getting dressed.  Remove throw rugs and tripping hazards from the floor. KITCHEN  Keep handles on pots and pans turned toward the center of the stove. Use back burners when possible.  Clean up spills quickly and allow time for drying.  Avoid walking on wet floors.  Avoid hot utensils and knives.  Position shelves so they are not too high or low.  Place commonly used objects within easy reach.  If necessary, use a sturdy step stool with a grab bar when reaching.  Keep electrical cables out of the  way.  Do not use floor polish or wax that makes floors slippery. If you must use wax, use non-skid floor wax.  Remove throw rugs and tripping hazards from the floor. STAIRWAYS  Never leave objects on stairs.  Place handrails on both sides of stairways and use them. Fix any loose handrails. Make sure handrails on both sides of the stairways are as long as the stairs.  Check carpeting to make sure it is firmly attached along stairs. Make repairs to worn or loose carpet promptly.  Avoid placing throw rugs at the top or bottom of stairways, or properly secure the rug with carpet tape to prevent slippage. Get rid of throw rugs, if possible.  Have an electrician put in a light switch at the top and bottom of the stairs. OTHER FALL PREVENTION TIPS  Wear low-heel or rubber-soled shoes that are supportive and fit well. Wear closed toe shoes.  When using a stepladder, make sure it is fully opened and both spreaders are firmly locked. Do not climb a closed stepladder.  Add color or contrast paint or tape to grab bars and handrails in your home. Place contrasting color strips on first and last steps.  Learn and use mobility aids as needed. Install an electrical emergency response system.  Turn on lights to avoid dark areas. Replace light bulbs that burn out immediately. Get light switches that glow.  Arrange furniture to create clear pathways. Keep furniture in the same place.  Firmly attach carpet with non-skid or double-sided tape. °· Eliminate uneven floor surfaces. °· Select a carpet pattern that does not visually hide the edge of steps. °· Be aware of all pets. °OTHER HOME SAFETY TIPS °· Set the water temperature for 120° F (48.8° C). °· Keep emergency numbers on or near the telephone. °· Keep smoke detectors on every level of the home and near sleeping areas. °Document Released: 05/25/2002 Document Revised: 12/04/2011 Document Reviewed: 08/24/2011 °ExitCare® Patient Information ©2015  [employer]. This information is not intended to replace advice given to you by your health care provider. Make sure you discuss any questions you have with your health care provider. ° ° °Head Injury °You have received a head injury. It does not appear serious at this time. Headaches and vomiting are common following head injury. It should be easy to awaken from sleeping. Sometimes it is necessary for you to stay in the emergency department for a while for observation. Sometimes admission to the hospital may be needed. After injuries such as yours, most problems occur within the first 24 hours, but side effects may occur up to 7-10 days after the injury. It is important for you to carefully monitor your condition and contact your health care provider or seek immediate medical care if there is a change in your condition. °WHAT ARE THE TYPES OF HEAD INJURIES? °Head injuries can be as minor as a bump. Some head injuries can be more severe. More severe head injuries include: °· A jarring injury to the brain (concussion). °· A bruise of the brain (contusion). This mean there is bleeding in the brain that can cause swelling. °· A cracked skull (skull fracture). °· Bleeding in the brain that collects, clots, and forms a bump (hematoma). °WHAT CAUSES A HEAD INJURY? °A serious head injury is most likely to happen to someone who is in a car wreck and is not wearing a seat belt. Other causes of major head injuries include bicycle or motorcycle accidents, sports injuries, and falls. °HOW ARE HEAD INJURIES DIAGNOSED? °A complete history of the event leading to the injury and your current symptoms will be helpful in diagnosing head injuries. Many times, pictures of the brain, such as CT or MRI are needed to see the extent of the injury. Often, an overnight hospital stay is necessary for observation.  °WHEN SHOULD I SEEK IMMEDIATE MEDICAL CARE?  °You should get help right away if: °· You have confusion or drowsiness. °· You  feel sick to your stomach (nauseous) or have continued, forceful vomiting. °· You have dizziness or unsteadiness that is getting worse. °· You have severe, continued headaches not relieved by medicine. Only take over-the-counter or prescription medicines for pain, fever, or discomfort as directed by your health care provider. °· You do not have normal function of the arms or legs or are unable to walk. °· You notice changes in the black spots in the center of the colored part of your eye (pupil). °· You have a clear or bloody fluid coming from your nose or ears. °· You have a loss of vision. °During the next 24 hours after the injury, you must stay with someone who can watch you for the warning signs. This person should contact local emergency services (911 in the [place] if you have seizures, you become unconscious, or you are unable to wake up. °HOW CAN I PREVENT A HEAD INJURY IN THE FUTURE? °The most important factor for preventing major head injuries is avoiding motor   vehicle accidents.  To minimize the potential for damage to your head, it is crucial to wear seat belts while riding in motor vehicles. Wearing helmets while bike riding and playing collision sports (like football) is also helpful. Also, avoiding dangerous activities around the house will further help reduce your risk of head injury.  °WHEN CAN I RETURN TO NORMAL ACTIVITIES AND ATHLETICS? °You should be reevaluated by your health care provider before returning to these activities. If you have any of the following symptoms, you should not return to activities or contact sports until 1 week after the symptoms have stopped: °· Persistent headache. °· Dizziness or vertigo. °· Poor attention and concentration. °· Confusion. °· Memory problems. °· Nausea or vomiting. °· Fatigue or tire easily. °· Irritability. °· Intolerant of bright lights or loud noises. °· Anxiety or depression. °· Disturbed sleep. °MAKE SURE YOU:  °· Understand these  instructions. °· Will watch your condition. °· Will get help right away if you are not doing well or get worse. °Document Released: 06/04/2005 Document Revised: 06/09/2013 Document Reviewed: 02/09/2013 °ExitCare® Patient Information ©2015 [employer]. This information is not intended to replace advice given to you by your health care provider. Make sure you discuss any questions you have with your health care provider. ° °

## 2014-05-12 NOTE — ED Notes (Signed)
Izora Gala Via, pt's daughter, please contact to give an update on pt's status if going to be admitted or discharged, can be reached at [phone].

## 2014-05-12 NOTE — ED Notes (Signed)
Pt states she feels better and is going home

## 2014-05-12 NOTE — ED Provider Notes (Signed)
CSN: [id number]     Arrival date & time 05/12/14  1721 History   First MD Initiated Contact with Patient 05/12/14 1752     Chief Complaint  Patient presents with  . Fall  . Facial Laceration     (Consider location/radiation/quality/duration/timing/severity/associated sxs/prior Treatment) HPI  80 year old female presents after a fall at home. The patient was trying to get a newspaper over her front porch and states when she is bending over she fell and landed on her face. She has a history of frequent falls and states this is no different than her typical falls. Did not lose consciousness. Patient is currently on Plavix for coronary artery disease. The patient denies any neck pain. She has a large hematoma around her left eye this states is approximately a 5 out of 10 in pain. Denies any weakness or numbness. No neck pain.  Past Medical History  Diagnosis Date  . Vertigo   . Hernia   . CKD (chronic kidney disease), stage III     a. "one kidney rotted" (02/09/2013). (?) b. likely stage III by historical bloodwork.  . Glaucoma, right eye   . GERD (gastroesophageal reflux disease)   . Asthma   . Seasonal allergies   . Type II diabetes mellitus   . Blood transfusion reaction   . Arthritis     "all over" (02/09/2013)  . Depression   . Skin cancer of nose     "left side" (02/09/2013)  . Stroke 1960's    "a light one" (02/09/2013)  . Falls frequently   . CAD (coronary artery disease)     a. prior hx of RCA stent in 1996. b. 12/2013: inferior STEMI s/p extensive PTCA only of occluded RCA (no stents); distal LAD and OM were chronically occluded, EF 55-60%.  Marland Kitchen HTN (hypertension)   . Hyperlipidemia   . Anemia     a. Mild, noted on bloodwork 12/2013.  Marland Kitchen Thrombocytopenia     a. Mild, noted on bloodwork 12/2013.  . Mild dementia   . Cardiogenic shock     a. 12/2013: during STEMI with SBP 60s, AV block/bradycardia, resolved with reperfusion.   Past Surgical History  Procedure Laterality Date    . Appendectomy    . Umbilical hernia repair    . Tubal ligation    . Nephrectomy Left     Archie Endo 02/27/2005 (02/09/2013)  . Eye surgery Left   . Coronary angioplasty with stent placement    . Cardiac catheterization  2005    Archie Endo 05/07/2006 (02/09/2013)  . Vitrectomy Right     Archie Endo 01/19/2002  (02/09/2013)  . Cholecystectomy  02/2005    Archie Endo 02/28/2005 (02/09/2013)   Family History  Problem Relation Age of Onset  . Cancer Mother   . Cancer Father   . Diabetes Son   . Diabetes Brother   . Stroke Son   . Stroke Daughter   . Heart attack Neg Hx    History  Substance Use Topics  . Smoking status: Former Smoker -- 3.00 packs/day for 32 years    Types: Cigarettes    Quit date: 06/18/1973  . Smokeless tobacco: Never Used  . Alcohol Use: No     Comment: 02/09/2013 "haven't had a drink in 29 yr; used to have a beer once in awhile"   OB History    No data available     Review of Systems  HENT: Positive for facial swelling.   Gastrointestinal: Negative for vomiting.  Skin: Positive for wound.  Neurological: Positive for headaches. Negative for weakness and numbness.  All other systems reviewed and are negative.     Allergies  Fluticasone-salmeterol  Home Medications   Prior to Admission medications   Medication Sig Start Date End Date Taking? Authorizing Provider  acetaminophen (TYLENOL) 325 MG tablet Take 650 mg by mouth every 6 (six) hours as needed (pain).     Historical Provider, MD  aspirin 81 MG EC tablet Take 81 mg by mouth daily.      Historical Provider, MD  Cholecalciferol (VITAMIN D3) 2000 UNITS TABS Take 2,000 Units by mouth daily.    Historical Provider, MD  clopidogrel (PLAVIX) 75 MG tablet Take 1 tablet (75 mg total) by mouth daily. 01/29/14   Liliane Shi, PA-C  docusate sodium (COLACE) 100 MG capsule Take 100 mg by mouth daily.     Historical Provider, MD  GLUCERNA (GLUCERNA) LIQD Take 237 mLs by mouth daily. chocolate    Historical Provider, MD   glyBURIDE micronized (GLYNASE) 6 MG tablet Take 3 mg by mouth daily with breakfast.    Historical Provider, MD  metoprolol succinate (TOPROL XL) 25 MG 24 hr tablet Take 1 tablet (25 mg total) by mouth daily. 04/01/14   Liliane Shi, PA-C  nitroGLYCERIN (NITROSTAT) 0.4 MG SL tablet Place 1 tablet (0.4 mg total) under the tongue every 5 (five) minutes as needed for chest pain (up to 3 doses). 01/19/14   Dayna N Dunn, PA-C  pantoprazole (PROTONIX) 40 MG tablet Take 1 tablet (40 mg total) by mouth daily. 01/19/14   Dayna N Dunn, PA-C  rosuvastatin (CRESTOR) 10 MG tablet 1 TABLET DAILY 04/06/14   Liliane Shi, PA-C  sertraline (ZOLOFT) 100 MG tablet Take 100 mg by mouth daily.      Historical Provider, MD  Skin Protectants, Misc. (EUCERIN) cream Apply 1 application topically 2 (two) times daily.    Historical Provider, MD  triamcinolone cream (KENALOG) 0.1 % Apply 1 application topically 2 (two) times daily as needed (for legs).    Historical Provider, MD   BP 109/93 mmHg  Pulse 99  Temp(Src) 98.3 F (36.8 C) (Oral)  Resp 17  SpO2 95% Physical Exam  Constitutional: She is oriented to person, place, and time. She appears well-developed and well-nourished.  HENT:  Head:    Right Ear: External ear normal.  Left Ear: External ear normal.  Nose: Nose normal.  Eyes: Conjunctivae and EOM are normal. Pupils are equal, round, and reactive to light. Right eye exhibits no discharge. Left eye exhibits no discharge. Right conjunctiva is not injected. Right conjunctiva has no hemorrhage. Left conjunctiva is not injected. Left conjunctiva has no hemorrhage. Right eye exhibits normal extraocular motion. Left eye exhibits normal extraocular motion.  Left eye difficult to view due to significant periorbital swelling, but when viewed conjunctivae appears normal, pupil normal and reactive and able to move eye in all directions  Neck: Normal range of motion.  No neck tenderness, full ROM without pain   Cardiovascular: Normal rate, regular rhythm and normal heart sounds.   Pulmonary/Chest: Effort normal and breath sounds normal.  Abdominal: She exhibits no distension.  Neurological: She is alert and oriented to person, place, and time.  Skin: Skin is warm and dry.  Vitals reviewed.   ED Course  Procedures (including critical care time) Labs Review Labs Reviewed - No data to display  Imaging Review Ct Head Wo Contrast  05/12/2014   CLINICAL DATA:  Patient fell with facial lacerations in  swelling  EXAM: CT HEAD WITHOUT CONTRAST  CT MAXILLOFACIAL WITHOUT CONTRAST  TECHNIQUE: Multidetector CT imaging of the head and maxillofacial structures were performed using the standard protocol without intravenous contrast. Multiplanar CT image reconstructions of the maxillofacial structures were also generated.  COMPARISON:  Head CT February 09, 2013 ; CT cervical spine September 28, 2012  FINDINGS: CT HEAD FINDINGS  There is moderate diffuse atrophy. There is no intracranial mass, hemorrhage, extra-axial fluid collection, or midline shift. There is small vessel disease, patchy, throughout the centra semiovale bilaterally. There is a small nonacute appearing infarct in the periphery of the inferior left cerebellum. No acute appearing infarct is demonstrable on this study. There is marked left frontal and upper facial soft tissue hematoma. The bony calvarium appears intact. The mastoid air cells are clear.  CT MAXILLOFACIAL FINDINGS  There is extensive soft tissue swelling with hematoma over the left eye and upper face. The soft tissue swelling and hematoma appear preseptal, although they surround a orbit medially, anteriorly, and laterally. There is no intraorbital lesion on either side.  There is no demonstrable fracture or dislocation. There is a minimal retention cyst in the inferior left maxillary antrum. Other paranasal sinuses are clear. Ostiomeatal unit complexes are patent bilaterally. There is slight  rightward deviation of the nasal septum.  More inferiorly, the salivary glands appear unremarkable. There is no appreciable adenopathy.  In the visualized cervical spine, there is stable anterolisthesis of C3 on C4, felt to be due to chronic spondylosis. There is extensive cervical spine arthropathy, stable in appearance compared to most recent prior study.  IMPRESSION: CT head: Atrophy with supratentorial small vessel disease. Prior small infarct periphery of the inferior left cerebellum. No acute infarct appreciable. No intracranial mass or hemorrhage. Large left frontal soft tissue/ scalp hematoma.  CT maxillofacial: Extensive soft tissue swelling and hematoma over the left eye and upper face. No apparent fracture. Arthropathy in the visualized cervical spine with stable anterolisthesis of C3 on C4 compared to prior study. Paranasal sinuses are clear except for a small retention cyst in the inferior left maxillary antrum.   Electronically Signed   By: Lowella Grip M.D.   On: 05/12/2014 19:14   Ct Maxillofacial Wo Cm  05/12/2014   CLINICAL DATA:  Patient fell with facial lacerations in swelling  EXAM: CT HEAD WITHOUT CONTRAST  CT MAXILLOFACIAL WITHOUT CONTRAST  TECHNIQUE: Multidetector CT imaging of the head and maxillofacial structures were performed using the standard protocol without intravenous contrast. Multiplanar CT image reconstructions of the maxillofacial structures were also generated.  COMPARISON:  Head CT February 09, 2013 ; CT cervical spine September 28, 2012  FINDINGS: CT HEAD FINDINGS  There is moderate diffuse atrophy. There is no intracranial mass, hemorrhage, extra-axial fluid collection, or midline shift. There is small vessel disease, patchy, throughout the centra semiovale bilaterally. There is a small nonacute appearing infarct in the periphery of the inferior left cerebellum. No acute appearing infarct is demonstrable on this study. There is marked left frontal and upper facial soft  tissue hematoma. The bony calvarium appears intact. The mastoid air cells are clear.  CT MAXILLOFACIAL FINDINGS  There is extensive soft tissue swelling with hematoma over the left eye and upper face. The soft tissue swelling and hematoma appear preseptal, although they surround a orbit medially, anteriorly, and laterally. There is no intraorbital lesion on either side.  There is no demonstrable fracture or dislocation. There is a minimal retention cyst in the inferior left maxillary antrum. Other  paranasal sinuses are clear. Ostiomeatal unit complexes are patent bilaterally. There is slight rightward deviation of the nasal septum.  More inferiorly, the salivary glands appear unremarkable. There is no appreciable adenopathy.  In the visualized cervical spine, there is stable anterolisthesis of C3 on C4, felt to be due to chronic spondylosis. There is extensive cervical spine arthropathy, stable in appearance compared to most recent prior study.  IMPRESSION: CT head: Atrophy with supratentorial small vessel disease. Prior small infarct periphery of the inferior left cerebellum. No acute infarct appreciable. No intracranial mass or hemorrhage. Large left frontal soft tissue/ scalp hematoma.  CT maxillofacial: Extensive soft tissue swelling and hematoma over the left eye and upper face. No apparent fracture. Arthropathy in the visualized cervical spine with stable anterolisthesis of C3 on C4 compared to prior study. Paranasal sinuses are clear except for a small retention cyst in the inferior left maxillary antrum.   Electronically Signed   By: Lowella Grip M.D.   On: 05/12/2014 19:14     EKG Interpretation None      MDM   Final diagnoses:  Fall, initial encounter  Periorbital hematoma, left    Patient is alert and oriented and seems to be neurologically intact. Has large periorbital swelling but no evidence of fracture on CT scanning. No head bleed. Patient has good pain control with one dose of  Norco. She is able to move her left eye without pain and has no blurry vision when eyes opened. At this point I feel she is stable for discharge, and will treat pain and will recommend fall precautions.    Ephraim Hamburger, MD 05/12/14 [phone]

## 2014-05-12 NOTE — ED Notes (Signed)
Patient transported to CT 

## 2014-05-12 NOTE — ED Notes (Signed)
Per EMS pt fell at her apartment, pt has hematoma and laceration around left eye. Pt denies LOC, blood thinners, neck or head pain.  Per pt's friends on scene pt was at her baseline.

## 2014-05-12 NOTE — ED Notes (Signed)
Bed: WG95 Expected date:  Expected time:  Means of arrival:  Comments: EMS/fall/c.h.i.

## 2014-05-27 ENCOUNTER — Encounter (HOSPITAL_COMMUNITY): Payer: Self-pay | Admitting: Cardiology

## 2014-06-21 ENCOUNTER — Encounter (HOSPITAL_COMMUNITY): Payer: Self-pay | Admitting: Emergency Medicine

## 2014-06-21 ENCOUNTER — Emergency Department (HOSPITAL_COMMUNITY): Payer: Medicare (Managed Care)

## 2014-06-21 ENCOUNTER — Emergency Department (HOSPITAL_COMMUNITY)
Admission: EM | Admit: 2014-06-21 | Discharge: 2014-06-22 | Disposition: A | Discharge status: Home / Self Care | Payer: Medicare (Managed Care) | Attending: Emergency Medicine | Admitting: Emergency Medicine

## 2014-06-21 DIAGNOSIS — N183 Chronic kidney disease, stage 3 (moderate): Secondary | ICD-10-CM | POA: Insufficient documentation

## 2014-06-21 DIAGNOSIS — Z87891 Personal history of nicotine dependence: Secondary | ICD-10-CM | POA: Insufficient documentation

## 2014-06-21 DIAGNOSIS — Y998 Other external cause status: Secondary | ICD-10-CM | POA: Diagnosis not present

## 2014-06-21 DIAGNOSIS — S61214A Laceration without foreign body of right ring finger without damage to nail, initial encounter: Secondary | ICD-10-CM | POA: Diagnosis not present

## 2014-06-21 DIAGNOSIS — S62639B Displaced fracture of distal phalanx of unspecified finger, initial encounter for open fracture: Secondary | ICD-10-CM

## 2014-06-21 DIAGNOSIS — W1839XA Other fall on same level, initial encounter: Secondary | ICD-10-CM | POA: Insufficient documentation

## 2014-06-21 DIAGNOSIS — Z85828 Personal history of other malignant neoplasm of skin: Secondary | ICD-10-CM | POA: Diagnosis not present

## 2014-06-21 DIAGNOSIS — E785 Hyperlipidemia, unspecified: Secondary | ICD-10-CM | POA: Insufficient documentation

## 2014-06-21 DIAGNOSIS — M1990 Unspecified osteoarthritis, unspecified site: Secondary | ICD-10-CM | POA: Insufficient documentation

## 2014-06-21 DIAGNOSIS — K219 Gastro-esophageal reflux disease without esophagitis: Secondary | ICD-10-CM | POA: Insufficient documentation

## 2014-06-21 DIAGNOSIS — I2510 Atherosclerotic heart disease of native coronary artery without angina pectoris: Secondary | ICD-10-CM | POA: Diagnosis not present

## 2014-06-21 DIAGNOSIS — Z862 Personal history of diseases of the blood and blood-forming organs and certain disorders involving the immune mechanism: Secondary | ICD-10-CM | POA: Insufficient documentation

## 2014-06-21 DIAGNOSIS — I129 Hypertensive chronic kidney disease with stage 1 through stage 4 chronic kidney disease, or unspecified chronic kidney disease: Secondary | ICD-10-CM | POA: Diagnosis not present

## 2014-06-21 DIAGNOSIS — Y9289 Other specified places as the place of occurrence of the external cause: Secondary | ICD-10-CM | POA: Diagnosis not present

## 2014-06-21 DIAGNOSIS — Z9889 Other specified postprocedural states: Secondary | ICD-10-CM | POA: Diagnosis not present

## 2014-06-21 DIAGNOSIS — F329 Major depressive disorder, single episode, unspecified: Secondary | ICD-10-CM | POA: Insufficient documentation

## 2014-06-21 DIAGNOSIS — Z8673 Personal history of transient ischemic attack (TIA), and cerebral infarction without residual deficits: Secondary | ICD-10-CM | POA: Diagnosis not present

## 2014-06-21 DIAGNOSIS — F0390 Unspecified dementia without behavioral disturbance: Secondary | ICD-10-CM | POA: Diagnosis not present

## 2014-06-21 DIAGNOSIS — Z9861 Coronary angioplasty status: Secondary | ICD-10-CM | POA: Diagnosis not present

## 2014-06-21 DIAGNOSIS — Z8669 Personal history of other diseases of the nervous system and sense organs: Secondary | ICD-10-CM | POA: Insufficient documentation

## 2014-06-21 DIAGNOSIS — S62664B Nondisplaced fracture of distal phalanx of right ring finger, initial encounter for open fracture: Secondary | ICD-10-CM | POA: Diagnosis not present

## 2014-06-21 DIAGNOSIS — IMO0002 Reserved for concepts with insufficient information to code with codable children: Secondary | ICD-10-CM

## 2014-06-21 DIAGNOSIS — Y9389 Activity, other specified: Secondary | ICD-10-CM | POA: Insufficient documentation

## 2014-06-21 DIAGNOSIS — Z9181 History of falling: Secondary | ICD-10-CM | POA: Insufficient documentation

## 2014-06-21 DIAGNOSIS — Z7982 Long term (current) use of aspirin: Secondary | ICD-10-CM | POA: Insufficient documentation

## 2014-06-21 DIAGNOSIS — E119 Type 2 diabetes mellitus without complications: Secondary | ICD-10-CM | POA: Diagnosis not present

## 2014-06-21 DIAGNOSIS — Z79899 Other long term (current) drug therapy: Secondary | ICD-10-CM | POA: Diagnosis not present

## 2014-06-21 DIAGNOSIS — W19XXXA Unspecified fall, initial encounter: Secondary | ICD-10-CM

## 2014-06-21 DIAGNOSIS — J45909 Unspecified asthma, uncomplicated: Secondary | ICD-10-CM | POA: Insufficient documentation

## 2014-06-21 DIAGNOSIS — S6991XA Unspecified injury of right wrist, hand and finger(s), initial encounter: Secondary | ICD-10-CM | POA: Diagnosis present

## 2014-06-21 MED ORDER — LIDOCAINE HCL (PF) 1 % IJ SOLN
5.0000 mL | Freq: Once | INTRAMUSCULAR | Status: AC
  Administered 2014-06-21: 5 mL via INTRADERMAL
  Filled 2014-06-21: qty 5

## 2014-06-21 NOTE — ED Notes (Signed)
Pt had a fall today at home injuring her right hand  Pt has dressing noted to her right ring finger  Family states she has a laceration to that finger and pt is c/o pain to her right pinky finger  EMS came out earlier but pt refused transport at that time  Pt is on blood thinners  Denies any other injuries

## 2014-06-22 NOTE — ED Provider Notes (Signed)
CSN: [id number]     Arrival date & time 06/21/14  2100 History   First MD Initiated Contact with Patient 06/21/14 2235     Chief Complaint  Patient presents with  . Fall  . Hand Injury     (Consider location/radiation/quality/duration/timing/severity/associated sxs/prior Treatment) Patient is a 81 y.o. female presenting with fall and hand injury. The history is provided by the patient, medical records and a relative. No language interpreter was used.  Fall Pertinent negatives include no abdominal pain, chest pain, coughing, diaphoresis, fatigue, fever, headaches, nausea, numbness, rash, vomiting or weakness.  Hand Injury Associated symptoms: no back pain, no fatigue and no fever     [name] is a 81 y.o. female  with a hx of vertigo, CKD, glaucoma, CVA, frequent falls, HTN, anemia, chronic anticoagulation with Plavix (CABG with stent placement) presents to the Emergency Department complaining of gradual, persistent, laceration to the right ring finger onset 1pm today.  Pt reports mechanical fall, striking her right hand on her walker.  Pt reports she did not hit her head and was immediately able to get up.  Pt's daughter reports that she has frequent falls due to her vertigo.  Pt's daughter reports that the forehead hematoma happened in November.  Pt and daughter deny new trauma to the head.  Pt denies headache or neck pain and is reliable.  She is able to accurately account for the fall today and denies syncope.  Pt attempted to stop the bleeding at home.  EMS was contacted and assessed her, but pt refused transport at that time.  Pt denies , chills, headache, neck pain, chest pain, shortness of breath, abdominal pain, nausea, vomiting, diarrhea, weakness, dizziness, syncope, dysuria, hematuria.  Past Medical History  Diagnosis Date  . Vertigo   . Hernia   . CKD (chronic kidney disease), stage III     a. "one kidney rotted" (02/09/2013). (?) b. likely stage III by historical bloodwork.   . Glaucoma, right eye   . GERD (gastroesophageal reflux disease)   . Asthma   . Seasonal allergies   . Type II diabetes mellitus   . Blood transfusion reaction   . Arthritis     "all over" (02/09/2013)  . Depression   . Skin cancer of nose     "left side" (02/09/2013)  . Stroke 1960's    "a light one" (02/09/2013)  . Falls frequently   . CAD (coronary artery disease)     a. prior hx of RCA stent in 1996. b. 12/2013: inferior STEMI s/p extensive PTCA only of occluded RCA (no stents); distal LAD and OM were chronically occluded, EF 55-60%.  Marland Kitchen HTN (hypertension)   . Hyperlipidemia   . Anemia     a. Mild, noted on bloodwork 12/2013.  Marland Kitchen Thrombocytopenia     a. Mild, noted on bloodwork 12/2013.  . Mild dementia   . Cardiogenic shock     a. 12/2013: during STEMI with SBP 60s, AV block/bradycardia, resolved with reperfusion.   Past Surgical History  Procedure Laterality Date  . Appendectomy    . Umbilical hernia repair    . Tubal ligation    . Nephrectomy Left     Archie Endo 02/27/2005 (02/09/2013)  . Eye surgery Left   . Coronary angioplasty with stent placement    . Cardiac catheterization  2005    Archie Endo 05/07/2006 (02/09/2013)  . Vitrectomy Right     Archie Endo 01/19/2002  (02/09/2013)  . Cholecystectomy  02/2005    Archie Endo  02/28/2005 (02/09/2013)  . Left heart catheterization with coronary angiogram Bilateral 01/15/2014    Procedure: LEFT HEART CATHETERIZATION WITH CORONARY ANGIOGRAM;  Surgeon: Peter M Martinique, MD;  Location: [facility] CATH LAB;  Service: Cardiovascular;  Laterality: Bilateral;   Family History  Problem Relation Age of Onset  . Cancer Mother   . Cancer Father   . Diabetes Son   . Diabetes Brother   . Stroke Son   . Stroke Daughter   . Heart attack Neg Hx    History  Substance Use Topics  . Smoking status: Former Smoker -- 3.00 packs/day for 32 years    Types: Cigarettes    Quit date: 06/18/1973  . Smokeless tobacco: Never Used  . Alcohol Use: No     Comment: 02/09/2013  "haven't had a drink in 29 yr; used to have a beer once in awhile"   OB History    No data available     Review of Systems  Constitutional: Negative for fever, diaphoresis, appetite change, fatigue and unexpected weight change.  HENT: Negative for mouth sores.   Eyes: Negative for visual disturbance.  Respiratory: Negative for cough, chest tightness, shortness of breath and wheezing.   Cardiovascular: Negative for chest pain.  Gastrointestinal: Negative for nausea, vomiting, abdominal pain, diarrhea and constipation.  Endocrine: Negative for polydipsia, polyphagia and polyuria.  Genitourinary: Negative for dysuria, urgency, frequency and hematuria.  Musculoskeletal: Negative for back pain and neck stiffness.  Skin: Positive for wound ( finger laceration ). Negative for rash.  Allergic/Immunologic: Negative for immunocompromised state.  Neurological: Negative for syncope, weakness, light-headedness, numbness and headaches.  Hematological: Does not bruise/bleed easily.  Psychiatric/Behavioral: Negative for sleep disturbance. The patient is not nervous/anxious.       Allergies  Fluticasone-salmeterol  Home Medications   Prior to Admission medications   Medication Sig Start Date End Date Taking? Authorizing Provider  aspirin EC 81 MG tablet Take 81 mg by mouth daily.   Yes Historical Provider, MD  triamcinolone cream (KENALOG) 0.1 % Apply 1 application topically 2 (two) times daily as needed. Apply to legs   Yes Historical Provider, MD  clopidogrel (PLAVIX) 75 MG tablet Take 1 tablet (75 mg total) by mouth daily. 01/29/14   Liliane Shi, PA-C  glipiZIDE (GLUCOTROL XL) 5 MG 24 hr tablet Take 5 mg by mouth daily with breakfast.    Historical Provider, MD  HYDROcodone-acetaminophen (NORCO/VICODIN) 5-325 MG per tablet Take 0.5-1 tablets by mouth every 6 (six) hours as needed for severe pain. Patient not taking: Reported on 06/21/2014 05/12/14   Ephraim Hamburger, MD  metoprolol succinate  (TOPROL XL) 25 MG 24 hr tablet Take 1 tablet (25 mg total) by mouth daily. Patient not taking: Reported on 05/12/2014 04/01/14   Liliane Shi, PA-C  nitroGLYCERIN (NITROSTAT) 0.4 MG SL tablet Place 1 tablet (0.4 mg total) under the tongue every 5 (five) minutes as needed for chest pain (up to 3 doses). 01/19/14   Dayna N Dunn, PA-C  pantoprazole (PROTONIX) 40 MG tablet Take 1 tablet (40 mg total) by mouth daily. 01/19/14   Dayna N Dunn, PA-C  rosuvastatin (CRESTOR) 10 MG tablet 1 TABLET DAILY Patient not taking: Reported on 05/12/2014 04/06/14   Liliane Shi, PA-C  sertraline (ZOLOFT) 100 MG tablet Take 100 mg by mouth daily.      Historical Provider, MD  Skin Protectants, Misc. (EUCERIN) cream Apply 1 application topically 2 (two) times daily.    Historical Provider, MD   BP  130/64 mmHg  Pulse 89  Temp(Src) 97.8 F (36.6 C) (Oral)  Resp 14  SpO2 92% Physical Exam  Constitutional: She is oriented to person, place, and time. She appears well-developed and well-nourished. No distress.  Awake, alert, nontoxic appearance  HENT:  Head: Normocephalic and atraumatic.  Right Ear: Tympanic membrane, external ear and ear canal normal.  Left Ear: Tympanic membrane, external ear and ear canal normal.  Nose: Nose normal. No epistaxis. Right sinus exhibits no maxillary sinus tenderness and no frontal sinus tenderness. Left sinus exhibits no maxillary sinus tenderness and no frontal sinus tenderness.  Mouth/Throat: Uvula is midline, oropharynx is clear and moist and mucous membranes are normal. Mucous membranes are not pale and not cyanotic. No oropharyngeal exudate, posterior oropharyngeal edema, posterior oropharyngeal erythema or tonsillar abscesses.  Old and healing contusions to the right forehead  Eyes: Conjunctivae are normal. Pupils are equal, round, and reactive to light. No scleral icterus.  Neck: Normal range of motion and full passive range of motion without pain. Neck supple.   Cardiovascular: Normal rate, regular rhythm, normal heart sounds and intact distal pulses.   No murmur heard. Capillary refill < 3 sec  Pulmonary/Chest: Effort normal and breath sounds normal. No stridor. No respiratory distress. She has no wheezes.  Equal chest expansion  Abdominal: Soft. Bowel sounds are normal. She exhibits no mass. There is no tenderness. There is no rebound and no guarding.  Musculoskeletal: Normal range of motion. She exhibits no edema.  ROM: full ROM of all fingers on the right hand; TTP of the right ring and little fingers  Lymphadenopathy:    She has no cervical adenopathy.  Neurological: She is alert and oriented to person, place, and time.  Sensation: intact to dull and sharp Strength: 3/5 in the right hand due to pain  Skin: Skin is warm and dry. No rash noted. She is not diaphoretic.  4.0cm laceration to the medial side of the right ring finger 1.5cm laceration to the pad of the right ring finger  Psychiatric: She has a normal mood and affect.  Nursing note and vitals reviewed.   ED Course  LACERATION REPAIR Date/Time: 06/22/2014 1:24 AM Performed by: Abigail Butts Authorized by: Abigail Butts Consent: Verbal consent obtained. Risks and benefits: risks, benefits and alternatives were discussed Consent given by: patient Patient understanding: patient states understanding of the procedure being performed Patient consent: the patient's understanding of the procedure matches consent given Procedure consent: procedure consent matches procedure scheduled Relevant documents: relevant documents present and verified Site marked: the operative site was marked Imaging studies: imaging studies available Required items: required blood products, implants, devices, and special equipment available Patient identity confirmed: verbally with patient and arm band Time out: Immediately prior to procedure a "time out" was called to verify the correct patient,  procedure, equipment, support staff and site/side marked as required. Body area: upper extremity Location details: right ring finger Laceration length: 4 cm Foreign bodies: no foreign bodies Tendon involvement: none Nerve involvement: none Vascular damage: yes Anesthesia: digital block Local anesthetic: lidocaine 1% without epinephrine Anesthetic total: 5 ml Patient sedated: no Preparation: Patient was prepped and draped in the usual sterile fashion. Irrigation solution: saline Irrigation method: syringe Amount of cleaning: extensive Debridement: none Degree of undermining: none Wound skin closure material used: 5-0 Vicryl Rapide  Number of sutures: 9 Technique: simple Approximation: loose Approximation difficulty: complex Dressing: non-adhesive packing strip and pressure dressing Patient tolerance: Patient tolerated the procedure well with no immediate complications  LACERATION REPAIR Date/Time: 06/22/2014 1:25 AM Performed by: Abigail Butts Authorized by: Abigail Butts Consent: Verbal consent obtained. Risks and benefits: risks, benefits and alternatives were discussed Consent given by: patient Patient understanding: patient states understanding of the procedure being performed Patient consent: the patient's understanding of the procedure matches consent given Procedure consent: procedure consent matches procedure scheduled Relevant documents: relevant documents present and verified Site marked: the operative site was marked Imaging studies: imaging studies available Required items: required blood products, implants, devices, and special equipment available Patient identity confirmed: verbally with patient Time out: Immediately prior to procedure a "time out" was called to verify the correct patient, procedure, equipment, support staff and site/side marked as required. Body area: upper extremity Location details: right ring finger Laceration length: 1.5  cm Foreign bodies: no foreign bodies Tendon involvement: none Nerve involvement: none Vascular damage: no Anesthesia: digital block Local anesthetic: lidocaine 1% without epinephrine Anesthetic total: 5 ml Patient sedated: no Preparation: Patient was prepped and draped in the usual sterile fashion. Irrigation solution: saline Irrigation method: syringe Amount of cleaning: extensive Wound skin closure material used: 5-0 Vicryl Rapide. Number of sutures: 3 Technique: simple Approximation: loose Approximation difficulty: complex Dressing: non-adhesive packing strip and pressure dressing Patient tolerance: Patient tolerated the procedure well with no immediate complications   (including critical care time) Labs Review Labs Reviewed - No data to display  Imaging Review Dg Wrist Complete Right  06/21/2014   CLINICAL DATA:  Right wrist pain after fall, swelling.  Laceration.  EXAM: RIGHT WRIST - COMPLETE 3+ VIEW  COMPARISON:  None.  FINDINGS: No fracture or dislocation. The bones are under mineralized. Soft tissue edema is noted. There are dense vascular calcifications.  IMPRESSION: Soft tissue edema.  No fracture of the right wrist.   Electronically Signed   By: Jeb Levering M.D.   On: 06/21/2014 23:45   Dg Hand Complete Right  06/21/2014   CLINICAL DATA:  Right hand pain after fall, laceration.  EXAM: RIGHT HAND - COMPLETE 3+ VIEW  COMPARISON:  None.  FINDINGS: There is a nondisplaced fracture of the fourth digit distal tuft. No intra-articular extension. No additional fracture. No radiopaque foreign body. There is multifocal osteoarthritis primarily of the digits. The bones are under mineralized. Vascular calcifications are noted.  IMPRESSION: 1. Nondisplaced fourth digit (ring finger) distal tuft fracture. No intra-articular extension. 2. Multifocal osteoarthritis.   Electronically Signed   By: Jeb Levering M.D.   On: 06/21/2014 23:44     EKG Interpretation None      MDM    Final diagnoses:  Fall  Laceration  Open fracture of tuft of distal phalanx of finger, initial encounter   [name] presents with laceration to the right hand after mechanical fall.  Patient is neurologically intact. Denies syncope or striking her head. She is alert and oriented 4 and able to accurately recount events of her fall. Patient without syncope.  She ambulates without difficulty here in the emergency department at baseline. No concern for head or neck injury at this time. Right hand with concern for possible fracture and large laceration.  1:34 AM Right ring finger with distal tuft fracture. Wound sutured.  Discussed with family wound care and careful monitoring for possible signs of infection.  Tdap UTD.  Pressure irrigation performed. Laceration occurred <10 hours prior to repair which was well tolerated. Pt has no co morbidities to effect normal wound healing. Discussed suture home care w pt and answered questions. Pt to f-u  for wound check in 5 days. Pt is hemodynamically stable w no complaints prior to dc.    I have personally reviewed patient's vitals, nursing note and any pertinent labs or imaging.  I performed an undressed physical exam.    It has been determined that no acute conditions requiring further emergency intervention are present at this time. The patient/guardian have been advised of the diagnosis and plan. I reviewed all labs and imaging including any potential incidental findings. We have discussed signs and symptoms that warrant return to the ED and they are listed in the discharge instructions.    Vital signs are stable at discharge.   BP 130/64 mmHg  Pulse 89  Temp(Src) 97.8 F (36.6 C) (Oral)  Resp 14  SpO2 92%  The patient was discussed with and seen by Dr. Ralene Bathe who agrees with the treatment plan.    Jarrett Soho Conrado Nance, PA-C 06/22/14 [place], MD 06/22/14 [phone]

## 2014-06-22 NOTE — Discharge Instructions (Signed)
1. Medications: Tylenol or ibuprofen for pain, usual home medications 2. Treatment: ice for swelling, keep wound clean with warm soap and water and keep bandage dry, do not submerge in water for 24 hours 3. Follow Up: Please follow-up with Dr. Caralyn Guile ASAP for wound check. Return to the emergency department for increased redness, drainage of pus from the wound   WOUND CARE  Keep area clean and dry for 24 hours. Do not remove bandage, if applied.  After 24 hours, remove bandage and wash wound gently with mild soap and warm water. Reapply a new bandage after cleaning wound, if directed.   Continue daily cleansing with soap and water until stitches/staples are removed.  Do not apply any ointments or creams to the wound while stitches/staples are in place, as this may cause delayed healing. Return if you experience any of the following signs of infection: Swelling, redness, pus drainage, streaking, fever >101.0 F  Return if you experience excessive bleeding that does not stop after 15-20 minutes of constant, firm pressure.

## 2014-07-16 ENCOUNTER — Telehealth: Payer: Self-pay | Admitting: Cardiology

## 2014-07-16 NOTE — Telephone Encounter (Signed)
Pt's dtr worried about pt's legs looking bruised, was to see [facility] in January but never got a letter, pls advise dtr nancy via

## 2014-07-16 NOTE — Telephone Encounter (Signed)
Pts Daughter Izora Gala calling to make an appt for the pt for Jul 22, 2014 with Dr Aundra Dubin if possible.  Daughter reports the pt was supposed to be seen in January according to her last OV note (avs) with Richardson Dopp PA-C in Oct 2015.  Daughter reports that nobody ever sent the pt or herself a recall letter to get the pt scheduled to see Dr Aundra Dubin.  Daughter reports that the pts legs have become more bruised in appearance over the course of several months.  Daughter reports that pt constantly complains they hurt all the time.  Daughter reports the pt has fallen multiple times over the course of several months too.  Daughter states the pt does live alone.  Daughter reports that she really feels like the pt needs to be set up for a f/u ov with Dr Aundra Dubin. Daughter reports the pt is in no acute distress.  Informed the daughter that I will send Dr Claris Gladden scheduler a message to follow-up with her and get the pt a f/u appt, preferably for Jul 22, 2014 for the daughter will also have an appt at our office with Dr Rayann Heman, and this makes for a easier commute.  Informed the daughter that I will also send Dr Aundra Dubin and Lelon Frohlich a message for further review, recommendation, and follow-up. Daughter verbalized understanding and agrees with this plan.

## 2014-07-17 NOTE — Telephone Encounter (Signed)
Make her an appointment as soon as possible.

## 2014-08-18 ENCOUNTER — Encounter: Payer: Self-pay | Admitting: Cardiology

## 2014-08-18 ENCOUNTER — Ambulatory Visit (INDEPENDENT_AMBULATORY_CARE_PROVIDER_SITE_OTHER): Payer: Medicare (Managed Care) | Admitting: Cardiology

## 2014-08-18 VITALS — BP 102/52 | HR 90 | Ht 61.0 in | Wt 143.0 lb

## 2014-08-18 DIAGNOSIS — H9190 Unspecified hearing loss, unspecified ear: Secondary | ICD-10-CM

## 2014-08-18 DIAGNOSIS — I10 Essential (primary) hypertension: Secondary | ICD-10-CM | POA: Diagnosis not present

## 2014-08-18 DIAGNOSIS — E7800 Pure hypercholesterolemia, unspecified: Secondary | ICD-10-CM

## 2014-08-18 DIAGNOSIS — E785 Hyperlipidemia, unspecified: Secondary | ICD-10-CM

## 2014-08-18 DIAGNOSIS — T148 Other injury of unspecified body region: Secondary | ICD-10-CM

## 2014-08-18 DIAGNOSIS — F0390 Unspecified dementia without behavioral disturbance: Secondary | ICD-10-CM

## 2014-08-18 DIAGNOSIS — T148XXA Other injury of unspecified body region, initial encounter: Secondary | ICD-10-CM | POA: Insufficient documentation

## 2014-08-18 DIAGNOSIS — F03A0 Unspecified dementia, mild, without behavioral disturbance, psychotic disturbance, mood disturbance, and anxiety: Secondary | ICD-10-CM

## 2014-08-18 NOTE — Assessment & Plan Note (Signed)
Lives alone in her own home with help under [facility] program

## 2014-08-18 NOTE — Assessment & Plan Note (Signed)
Makes communication difficult

## 2014-08-18 NOTE — Assessment & Plan Note (Signed)
Statin intol. Pt's daughter says her mother can't walk well when she takes statins. She has actually fallen twice since LOV.

## 2014-08-18 NOTE — Assessment & Plan Note (Signed)
prior hx of RCA stent in 1996. b. 12/2013: inferior STEMI s/p extensive PTCA only of occluded RCA (no stents); distal LAD and OM were chronically occluded, EF 55-60%.

## 2014-08-18 NOTE — Assessment & Plan Note (Signed)
Pt complains of bruising on her arms and legs

## 2014-08-18 NOTE — Progress Notes (Signed)
08/18/2014 [name]   1921/08/27  [id number]  Primary Physician Joanna Maroon, MD Primary Cardiologist: Dr [name]  HPI:  Pleasant 81 y/o female, HOH, mild dementia, still lives in her own home with assistance from the [facility] program. She is here with her daughter because of some bruising noted on her arms and legs. The pt has a history of CAD. She is s/p an inferior STEMI July 2015 treated with an RCA POBA. She has residual CTO of the LAD and OM. Her EF was preserved at 55%. The pt is a difficult historian. She tells me she takes NTG and Tylenol for arm and chest pain. Her daughter says she cannot tolerate statin Rx secondary to myalgia and weakness. She says her mother has fallen twice since we saw her in Oct. The pt's main concern is bruising on her arms and legs. There was no history of melana or GI bleeding.    Current Outpatient Prescriptions  Medication Sig Dispense Refill  . aspirin EC 81 MG tablet Take 81 mg by mouth daily.    . clopidogrel (PLAVIX) 75 MG tablet Take 1 tablet (75 mg total) by mouth daily. 30 tablet 11  . glipiZIDE (GLUCOTROL XL) 5 MG 24 hr tablet Take 5 mg by mouth daily with breakfast.    . HYDROcodone-acetaminophen (NORCO/VICODIN) 5-325 MG per tablet Take 0.5-1 tablets by mouth every 6 (six) hours as needed for severe pain. 10 tablet 0  . metoprolol succinate (TOPROL XL) 25 MG 24 hr tablet Take 1 tablet (25 mg total) by mouth daily. 30 tablet 11  . nitroGLYCERIN (NITROSTAT) 0.4 MG SL tablet Place 1 tablet (0.4 mg total) under the tongue every 5 (five) minutes as needed for chest pain (up to 3 doses). 25 tablet 3  . pantoprazole (PROTONIX) 40 MG tablet Take 1 tablet (40 mg total) by mouth daily.    . sertraline (ZOLOFT) 100 MG tablet Take 100 mg by mouth daily.      . Skin Protectants, Misc. (EUCERIN) cream Apply 1 application topically 2 (two) times daily.    Marland Kitchen triamcinolone cream (KENALOG) 0.1 % Apply 1 application topically 2 (two) times daily as  needed. Apply to legs     No current facility-administered medications for this visit.    Allergies  Allergen Reactions  . Fluticasone-Salmeterol Other (See Comments)    Unknown reaction    History   Social History  . Marital Status: Widowed    Spouse Name: N/A  . Number of Children: N/A  . Years of Education: N/A   Occupational History  . Not on file.   Social History Main Topics  . Smoking status: Former Smoker -- 3.00 packs/day for 32 years    Types: Cigarettes    Quit date: 06/18/1973  . Smokeless tobacco: Never Used  . Alcohol Use: No     Comment: 02/09/2013 "haven't had a drink in 29 yr; used to have a beer once in awhile"  . Drug Use: No  . Sexual Activity: No   Other Topics Concern  . Not on file   Social History Narrative     Review of Systems: General: negative for chills, fever, night sweats or weight changes.  Cardiovascular: negative for chest pain, dyspnea on exertion, edema, orthopnea, palpitations, paroxysmal nocturnal dyspnea or shortness of breath Dermatological: negative for rash Respiratory: negative for cough or wheezing Urologic: negative for hematuria Abdominal: negative for nausea, vomiting, diarrhea, bright red blood per rectum, melena, or hematemesis Neurologic: negative for  visual changes, syncope, or dizziness All other systems reviewed and are otherwise negative except as noted above.    Blood pressure 102/52, pulse 90, height 5\' 1"  (1.549 m), weight 143 lb (64.864 kg), SpO2 92 %.  General appearance: alert, cooperative, no distress and HOH Lungs: clear to auscultation bilaterally Heart: regular rate and rhythm Extremities: small areas of localized ecchymosis on her arms and thighs. Chronic LE venous skin changes  EKG NSR, low voltage, NSST changes  ASSESSMENT AND PLAN:   Bruising Pt complains of bruising on her arms and legs   CAD (coronary artery disease) prior hx of RCA stent in 1996. b. 12/2013: inferior STEMI s/p  extensive PTCA only of occluded RCA (no stents); distal LAD and OM were chronically occluded, EF 55-60%.   Mild dementia Lives alone in her own home with help under [facility] program   [employer] (hard of hearing) Makes communication difficult   HYPERCHOLESTEROLEMIA Statin intol. Pt's daughter says her mother can't walk well when she takes statins. She has actually fallen twice since LOV.    PLAN  I explained we would not want to stop [name]'s Plavix and ASA if at all possible. She had a severe MI in July. I did tell them it was OK to stop her statin as it apparently was significantly affecting the pt's ability to ambulate. I did order a CBC and Plt count as well as a BMP. She'll f/u with Dr [name] in  3 months. I also instructed her to try Tylenol first before she takes NTG and that she could take Tylenol one every 6 hours if needs. It;s not clear her pain is anginal. I considered adding low dose Imdur but her B/P was borderline low.   [facility] KPA-C 08/18/2014 4:44 PM

## 2014-08-18 NOTE — Patient Instructions (Signed)
Your physician has recommended you make the following change in your medication:  1) STOP taking Crestor  Your physician recommends that you have lab work: today.  Your physician recommends that you schedule a follow-up appointment in: 3 months with Dr. Aundra Dubin.

## 2014-08-19 LAB — BASIC METABOLIC PANEL
BUN: 32 mg/dL — ABNORMAL HIGH (ref 6–23)
CO2: 31 mEq/L (ref 19–32)
Calcium: 9.3 mg/dL (ref 8.4–10.5)
Chloride: 102 mEq/L (ref 96–112)
Creatinine, Ser: 1.11 mg/dL (ref 0.40–1.20)
GFR: 49.04 mL/min — ABNORMAL LOW (ref >60.00)
Glucose, Bld: 268 mg/dL — ABNORMAL HIGH (ref 70–99)
Potassium: 4.5 mEq/L (ref 3.5–5.1)
Sodium: 139 mEq/L (ref 135–145)

## 2014-08-19 LAB — CBC
HCT: 39.3 % (ref 36.0–46.0)
Hemoglobin: 12.9 g/dL (ref 12.0–15.0)
MCHC: 32.9 g/dL (ref 30.0–36.0)
MCV: 95.3 fl (ref 78.0–100.0)
Platelets: 182 10*3/uL (ref 150.0–400.0)
RBC: 4.13 Mil/uL (ref 3.87–5.11)
RDW: 16.1 % — ABNORMAL HIGH (ref 11.5–15.5)
WBC: 8.3 10*3/uL (ref 4.0–10.5)

## 2014-10-04 ENCOUNTER — Ambulatory Visit
Admission: RE | Admit: 2014-10-04 | Discharge: 2014-10-04 | Disposition: A | Discharge status: Home / Self Care | Payer: Medicare (Managed Care) | Source: Ambulatory Visit | Attending: *Deleted | Admitting: *Deleted

## 2014-10-04 ENCOUNTER — Other Ambulatory Visit: Payer: Self-pay | Admitting: *Deleted

## 2014-10-04 DIAGNOSIS — W19XXXA Unspecified fall, initial encounter: Secondary | ICD-10-CM

## 2014-10-27 ENCOUNTER — Other Ambulatory Visit: Payer: Self-pay | Admitting: Family Medicine

## 2014-10-27 ENCOUNTER — Ambulatory Visit
Admission: RE | Admit: 2014-10-27 | Discharge: 2014-10-27 | Disposition: A | Discharge status: Home / Self Care | Payer: Medicare (Managed Care) | Source: Ambulatory Visit | Attending: Family Medicine | Admitting: Family Medicine

## 2014-10-27 DIAGNOSIS — Z0000 Encounter for general adult medical examination without abnormal findings: Secondary | ICD-10-CM

## 2014-11-02 ENCOUNTER — Encounter: Payer: Self-pay | Admitting: Cardiology

## 2014-11-30 ENCOUNTER — Ambulatory Visit: Payer: Medicare (Managed Care) | Admitting: Cardiology

## 2015-10-25 ENCOUNTER — Emergency Department (HOSPITAL_COMMUNITY)
Admission: EM | Admit: 2015-10-25 | Discharge: 2015-10-26 | Disposition: A | Discharge status: Home / Self Care | Payer: Medicare (Managed Care) | Attending: Emergency Medicine | Admitting: Emergency Medicine

## 2015-10-25 ENCOUNTER — Other Ambulatory Visit: Payer: Self-pay | Admitting: Family Medicine

## 2015-10-25 ENCOUNTER — Encounter (HOSPITAL_COMMUNITY): Payer: Self-pay

## 2015-10-25 ENCOUNTER — Emergency Department (HOSPITAL_COMMUNITY): Payer: Medicare (Managed Care)

## 2015-10-25 ENCOUNTER — Ambulatory Visit
Admission: RE | Admit: 2015-10-25 | Discharge: 2015-10-25 | Disposition: A | Discharge status: Home / Self Care | Payer: Medicare (Managed Care) | Source: Ambulatory Visit | Attending: Family Medicine | Admitting: Family Medicine

## 2015-10-25 DIAGNOSIS — J45909 Unspecified asthma, uncomplicated: Secondary | ICD-10-CM | POA: Diagnosis not present

## 2015-10-25 DIAGNOSIS — K219 Gastro-esophageal reflux disease without esophagitis: Secondary | ICD-10-CM | POA: Diagnosis not present

## 2015-10-25 DIAGNOSIS — Z87891 Personal history of nicotine dependence: Secondary | ICD-10-CM | POA: Insufficient documentation

## 2015-10-25 DIAGNOSIS — Z7902 Long term (current) use of antithrombotics/antiplatelets: Secondary | ICD-10-CM | POA: Insufficient documentation

## 2015-10-25 DIAGNOSIS — Z79899 Other long term (current) drug therapy: Secondary | ICD-10-CM | POA: Insufficient documentation

## 2015-10-25 DIAGNOSIS — Y9289 Other specified places as the place of occurrence of the external cause: Secondary | ICD-10-CM | POA: Insufficient documentation

## 2015-10-25 DIAGNOSIS — Z7982 Long term (current) use of aspirin: Secondary | ICD-10-CM | POA: Insufficient documentation

## 2015-10-25 DIAGNOSIS — N183 Chronic kidney disease, stage 3 (moderate): Secondary | ICD-10-CM | POA: Insufficient documentation

## 2015-10-25 DIAGNOSIS — K5900 Constipation, unspecified: Secondary | ICD-10-CM | POA: Diagnosis not present

## 2015-10-25 DIAGNOSIS — S3991XA Unspecified injury of abdomen, initial encounter: Secondary | ICD-10-CM | POA: Diagnosis not present

## 2015-10-25 DIAGNOSIS — R059 Cough, unspecified: Secondary | ICD-10-CM

## 2015-10-25 DIAGNOSIS — Z862 Personal history of diseases of the blood and blood-forming organs and certain disorders involving the immune mechanism: Secondary | ICD-10-CM | POA: Diagnosis not present

## 2015-10-25 DIAGNOSIS — Z9889 Other specified postprocedural states: Secondary | ICD-10-CM | POA: Insufficient documentation

## 2015-10-25 DIAGNOSIS — W19XXXA Unspecified fall, initial encounter: Secondary | ICD-10-CM

## 2015-10-25 DIAGNOSIS — Z8673 Personal history of transient ischemic attack (TIA), and cerebral infarction without residual deficits: Secondary | ICD-10-CM | POA: Diagnosis not present

## 2015-10-25 DIAGNOSIS — Y998 Other external cause status: Secondary | ICD-10-CM | POA: Diagnosis not present

## 2015-10-25 DIAGNOSIS — Z8669 Personal history of other diseases of the nervous system and sense organs: Secondary | ICD-10-CM | POA: Diagnosis not present

## 2015-10-25 DIAGNOSIS — Z9861 Coronary angioplasty status: Secondary | ICD-10-CM | POA: Diagnosis not present

## 2015-10-25 DIAGNOSIS — Z85828 Personal history of other malignant neoplasm of skin: Secondary | ICD-10-CM | POA: Diagnosis not present

## 2015-10-25 DIAGNOSIS — E1122 Type 2 diabetes mellitus with diabetic chronic kidney disease: Secondary | ICD-10-CM | POA: Insufficient documentation

## 2015-10-25 DIAGNOSIS — M159 Polyosteoarthritis, unspecified: Secondary | ICD-10-CM | POA: Diagnosis not present

## 2015-10-25 DIAGNOSIS — R911 Solitary pulmonary nodule: Secondary | ICD-10-CM | POA: Insufficient documentation

## 2015-10-25 DIAGNOSIS — I129 Hypertensive chronic kidney disease with stage 1 through stage 4 chronic kidney disease, or unspecified chronic kidney disease: Secondary | ICD-10-CM | POA: Diagnosis not present

## 2015-10-25 DIAGNOSIS — F0390 Unspecified dementia without behavioral disturbance: Secondary | ICD-10-CM | POA: Insufficient documentation

## 2015-10-25 DIAGNOSIS — Z7984 Long term (current) use of oral hypoglycemic drugs: Secondary | ICD-10-CM | POA: Insufficient documentation

## 2015-10-25 DIAGNOSIS — R05 Cough: Secondary | ICD-10-CM

## 2015-10-25 DIAGNOSIS — W07XXXA Fall from chair, initial encounter: Secondary | ICD-10-CM | POA: Insufficient documentation

## 2015-10-25 DIAGNOSIS — I2510 Atherosclerotic heart disease of native coronary artery without angina pectoris: Secondary | ICD-10-CM | POA: Diagnosis not present

## 2015-10-25 DIAGNOSIS — Y9389 Activity, other specified: Secondary | ICD-10-CM | POA: Diagnosis not present

## 2015-10-25 LAB — COMPREHENSIVE METABOLIC PANEL
ALT: 17 U/L (ref 14–54)
ANION GAP: 16 — AB (ref 5–15)
AST: 18 U/L (ref 15–41)
Albumin: 3.3 g/dL — ABNORMAL LOW (ref 3.5–5.0)
Alkaline Phosphatase: 66 U/L (ref 38–126)
BUN: 44 mg/dL — ABNORMAL HIGH (ref 6–20)
CALCIUM: 9.1 mg/dL (ref 8.9–10.3)
CHLORIDE: 94 mmol/L — AB (ref 101–111)
CO2: 27 mmol/L (ref 22–32)
Creatinine, Ser: 1.48 mg/dL — ABNORMAL HIGH (ref 0.44–1.00)
GFR calc non Af Amer: 30 mL/min — ABNORMAL LOW (ref >60)
GFR, EST AFRICAN AMERICAN: 34 mL/min — AB (ref >60)
Glucose, Bld: 188 mg/dL — ABNORMAL HIGH (ref 65–99)
Potassium: 4.9 mmol/L (ref 3.5–5.1)
SODIUM: 137 mmol/L (ref 135–145)
Total Bilirubin: 0.8 mg/dL (ref 0.3–1.2)
Total Protein: 7.2 g/dL (ref 6.5–8.1)

## 2015-10-25 LAB — CBC WITH DIFFERENTIAL/PLATELET
Basophils Absolute: 0 10*3/uL (ref 0.0–0.1)
Basophils Relative: 0 %
EOS ABS: 0 10*3/uL (ref 0.0–0.7)
Eosinophils Relative: 0 %
HCT: 41.8 % (ref 36.0–46.0)
HEMOGLOBIN: 13.5 g/dL (ref 12.0–15.0)
LYMPHS ABS: 0.9 10*3/uL (ref 0.7–4.0)
LYMPHS PCT: 11 %
MCH: 31.5 pg (ref 26.0–34.0)
MCHC: 32.3 g/dL (ref 30.0–36.0)
MCV: 97.4 fL (ref 78.0–100.0)
Monocytes Absolute: 0.9 10*3/uL (ref 0.1–1.0)
Monocytes Relative: 11 %
NEUTROS PCT: 78 %
Neutro Abs: 6.5 10*3/uL (ref 1.7–7.7)
Platelets: 187 10*3/uL (ref 150–400)
RBC: 4.29 MIL/uL (ref 3.87–5.11)
RDW: 14.1 % (ref 11.5–15.5)
WBC: 8.4 10*3/uL (ref 4.0–10.5)

## 2015-10-25 LAB — LIPASE, BLOOD: Lipase: 24 U/L (ref 11–51)

## 2015-10-25 MED ORDER — IOPAMIDOL (ISOVUE-300) INJECTION 61%
100.0000 mL | Freq: Once | INTRAVENOUS | Status: AC | PRN
  Administered 2015-10-25: 80 mL via INTRAVENOUS

## 2015-10-25 MED ORDER — SODIUM CHLORIDE 0.9 % IV BOLUS (SEPSIS)
500.0000 mL | Freq: Once | INTRAVENOUS | Status: AC
  Administered 2015-10-25: 500 mL via INTRAVENOUS

## 2015-10-25 NOTE — ED Notes (Signed)
Poor pulse ox placement. See 2133 for pulse ox reading

## 2015-10-25 NOTE — ED Notes (Signed)
Pt transported to CT ?

## 2015-10-25 NOTE — ED Notes (Signed)
Pt BIB EMS from home c/o pain under her L breast. Upon EMS arrival, pt found on floor. States that she tried to sit in her chair and missed. Denies head, neck and back pain. Pt is feeling anxiety and states that she was scared to stay home alone. Pt is hard of hearing and endorses vision loss. A message has been left with pts daughter about mothers condition. A&Ox4. Ambulates at home with a walker.

## 2015-10-25 NOTE — ED Provider Notes (Signed)
CSN: [id number]     Arrival date & time 10/25/15  1923 History   First MD Initiated Contact with Patient 10/25/15 2003     Chief Complaint  Patient presents with  . Abdominal Pain    under L breast  . Fall     (Consider location/radiation/quality/duration/timing/severity/associated sxs/prior Treatment) HPI  82 year old female presents after a fall. She was trying to sit down onto a chair and missed and fell. Unable to get up. Typically gets around with a wheelchair and walker. Lives at home alone. Did not hit head. No headache. No chest pain or dyspnea. Has had a cough, unclear exactly how long. Saw her PCP today and had a negative CXR. Has upper abdominal pain. She states this is mild and only if "you push on it". States this started after the fall, she thinks from being "jarred". However she also notes she's had abdominal pain "for a while". No leg/back pain. States she frequently loses her balances and falls a lot.  Past Medical History  Diagnosis Date  . Vertigo   . Hernia   . CKD (chronic kidney disease), stage III     a. "one kidney rotted" (02/09/2013). (?) b. likely stage III by historical bloodwork.  . Glaucoma, right eye   . GERD (gastroesophageal reflux disease)   . Asthma   . Seasonal allergies   . Type II diabetes mellitus ([place])   . Blood transfusion reaction   . Arthritis     "all over" (02/09/2013)  . Depression   . Skin cancer of nose     "left side" (02/09/2013)  . Stroke [facility]) 1960's    "a light one" (02/09/2013)  . Falls frequently   . CAD (coronary artery disease)     a. prior hx of RCA stent in 1996. b. 12/2013: inferior STEMI s/p extensive PTCA only of occluded RCA (no stents); distal LAD and OM were chronically occluded, EF 55-60%.  Marland Kitchen HTN (hypertension)   . Hyperlipidemia   . Anemia     a. Mild, noted on bloodwork 12/2013.  Marland Kitchen Thrombocytopenia ([place])     a. Mild, noted on bloodwork 12/2013.  . Mild dementia   . Cardiogenic shock ([place])     a. 12/2013: during  STEMI with SBP 60s, AV block/bradycardia, resolved with reperfusion.   Past Surgical History  Procedure Laterality Date  . Appendectomy    . Umbilical hernia repair    . Tubal ligation    . Nephrectomy Left     Archie Endo 02/27/2005 (02/09/2013)  . Eye surgery Left   . Coronary angioplasty with stent placement    . Cardiac catheterization  2005    Archie Endo 05/07/2006 (02/09/2013)  . Vitrectomy Right     Archie Endo 01/19/2002  (02/09/2013)  . Cholecystectomy  02/2005    Archie Endo 02/28/2005 (02/09/2013)  . Left heart catheterization with coronary angiogram Bilateral 01/15/2014    Procedure: LEFT HEART CATHETERIZATION WITH CORONARY ANGIOGRAM;  Surgeon: Peter M Martinique, MD;  Location: [facility] CATH LAB;  Service: Cardiovascular;  Laterality: Bilateral;   Family History  Problem Relation Age of Onset  . Cancer Mother   . Cancer Father   . Diabetes Son   . Diabetes Brother   . Stroke Son   . Stroke Daughter   . Heart attack Neg Hx    Social History  Substance Use Topics  . Smoking status: Former Smoker -- 3.00 packs/day for 32 years    Types: Cigarettes    Quit date: 06/18/1973  .  Smokeless tobacco: Never Used  . Alcohol Use: No     Comment: 02/09/2013 "haven't had a drink in 29 yr; used to have a beer once in awhile"   OB History    No data available     Review of Systems  Constitutional: Negative for fever.  Respiratory: Positive for cough. Negative for shortness of breath.   Cardiovascular: Negative for chest pain.  Gastrointestinal: Positive for abdominal pain and constipation.  Musculoskeletal: Negative for back pain and neck pain.  Neurological: Negative for weakness and headaches.  All other systems reviewed and are negative.     Allergies  Fluticasone-salmeterol  Home Medications   Prior to Admission medications   Medication Sig Start Date End Date Taking? Authorizing Provider  aspirin EC 81 MG tablet Take 81 mg by mouth daily.    Historical Provider, MD  clopidogrel (PLAVIX) 75 MG  tablet Take 1 tablet (75 mg total) by mouth daily. 01/29/14   Liliane Shi, PA-C  glipiZIDE (GLUCOTROL XL) 5 MG 24 hr tablet Take 5 mg by mouth daily with breakfast.    Historical Provider, MD  HYDROcodone-acetaminophen (NORCO/VICODIN) 5-325 MG per tablet Take 0.5-1 tablets by mouth every 6 (six) hours as needed for severe pain. 05/12/14   Sherwood Gambler, MD  metoprolol succinate (TOPROL XL) 25 MG 24 hr tablet Take 1 tablet (25 mg total) by mouth daily. 04/01/14   Liliane Shi, PA-C  nitroGLYCERIN (NITROSTAT) 0.4 MG SL tablet Place 1 tablet (0.4 mg total) under the tongue every 5 (five) minutes as needed for chest pain (up to 3 doses). 01/19/14   Dayna N Dunn, PA-C  pantoprazole (PROTONIX) 40 MG tablet Take 1 tablet (40 mg total) by mouth daily. 01/19/14   Dayna N Dunn, PA-C  sertraline (ZOLOFT) 100 MG tablet Take 100 mg by mouth daily.      Historical Provider, MD  Skin Protectants, Misc. (EUCERIN) cream Apply 1 application topically 2 (two) times daily.    Historical Provider, MD  triamcinolone cream (KENALOG) 0.1 % Apply 1 application topically 2 (two) times daily as needed. Apply to legs    Historical Provider, MD   BP 116/66 mmHg  Pulse 107  Temp(Src) 98.1 F (36.7 C) (Oral)  Resp 19  SpO2 90% Physical Exam  Constitutional: She is oriented to person, place, and time. She appears well-developed and well-nourished. No distress.  HENT:  Head: Normocephalic and atraumatic.  Right Ear: External ear normal.  Left Ear: External ear normal.  Nose: Nose normal.  Eyes: Right eye exhibits no discharge. Left eye exhibits no discharge.  Neck: Neck supple.  Cardiovascular: Normal rate, regular rhythm and normal heart sounds.   Pulmonary/Chest: Effort normal and breath sounds normal. She has no wheezes. She has no rales.  Abdominal: Soft. There is tenderness (mild, upper, non focal).  Musculoskeletal: She exhibits no edema.       Right hip: She exhibits normal range of motion and no tenderness.        Left hip: She exhibits normal range of motion and no tenderness.  Neurological: She is alert and oriented to person, place, and time.  Skin: Skin is warm and dry. She is not diaphoretic.  Nursing note and vitals reviewed.   ED Course  Procedures (including critical care time) Labs Review Labs Reviewed  COMPREHENSIVE METABOLIC PANEL - Abnormal; Notable for the following:    Chloride 94 (*)    Glucose, Bld 188 (*)    BUN 44 (*)  Creatinine, Ser 1.48 (*)    Albumin 3.3 (*)    GFR calc non Af Amer 30 (*)    GFR calc Af Amer 34 (*)    Anion gap 16 (*)    All other components within normal limits  LIPASE, BLOOD  CBC WITH DIFFERENTIAL/PLATELET  URINALYSIS, ROUTINE W REFLEX MICROSCOPIC (NOT AT [facility])    Imaging Review Dg Chest 2 View  10/25/2015  CLINICAL DATA:  Cough for several days.  History of asthma. EXAM: CHEST  2 VIEW COMPARISON:  02/09/2013 FINDINGS: Linear scarring in the lingula. No confluent airspace opacities. Heart is borderline in size. No effusions or acute bony abnormality. IMPRESSION: Lingular scarring.  No active disease. Electronically Signed   By: Rolm Baptise M.D.   On: 10/25/2015 11:43   Ct Abdomen Pelvis W Contrast  10/25/2015  CLINICAL DATA:  Fall out of chair at home. Left abdominal pain. Initial encounter. EXAM: CT ABDOMEN AND PELVIS WITH CONTRAST TECHNIQUE: Multidetector CT imaging of the abdomen and pelvis was performed using the standard protocol following bolus administration of intravenous contrast. CONTRAST:  32mL ISOVUE-300 IOPAMIDOL (ISOVUE-300) INJECTION 61% COMPARISON:  02/27/2005 FINDINGS: Lower chest: Images the lung bases show an ill-defined nodular opacity in the central right middle lobe measuring 2.5 x 1.9 cm on image 27/series 4. A 9 mm pulmonary nodule is also seen in the peripheral left upper lobe on image 3 of series 4. Hepatobiliary: No masses or parenchymal injury. Prior cholecystectomy noted. No evidence of biliary dilatation. Pancreas: No  mass, inflammatory changes, or parenchymal injury. Spleen: Within normal limits in size and appearance. No evidence of parenchymal injury or perisplenic hematoma. Adrenals/Urinary Tract: No adrenal hematoma or renal parenchymal injury identified. Prior left nephrectomy again noted. Small right lower pole renal cyst again noted, however there is no evidence of right renal mass or hydronephrosis. Small right-sided bladder diverticulum noted although unopacified urinary bladder is otherwise unremarkable. Stomach/Bowel: No evidence of obstruction, inflammatory process, or abnormal fluid collections. Vascular/Lymphatic: No pathologically enlarged lymph nodes. No evidence of abdominal aortic aneurysm. Aortic atherosclerotic calcification noted. Reproductive: No mass or other significant abnormality. Other: No evidence of hemoperitoneum. Musculoskeletal: No suspicious bone lesions identified. Severe thoracolumbar degenerative spondylosis noted. IMPRESSION: No acute findings within the abdomen or pelvis. Previous left nephrectomy again noted. Indeterminate nodular opacities in both lungs, measuring 2.5 cm in the right middle lobe and 9 mm in left upper lobe. These could be inflammatory/infectious or neoplastic in etiology. Recommend clinical correlation, and consider short-term follow-up chest CT in 1-2 months for further evaluation. Electronically Signed   By: Earle Gell M.D.   On: 10/25/2015 22:41   I have personally reviewed and evaluated these images and lab results as part of my medical decision-making.   EKG Interpretation   Date/Time:  Tuesday Oct 25 2015 20:33:44 EDT Ventricular Rate:  107 PR Interval:  167 QRS Duration: 103 QT Interval:  324 QTC Calculation: 432 R Axis:   44 Text Interpretation:  Sinus tachycardia Ventricular premature complex Low  voltage, extremity and precordial leads otherwise no significant change  since 2015 Confirmed by Ryder Man MD, Mindel Friscia [phone]) on 10/25/2015 8:41:43 PM       MDM   Final diagnoses:  Fall, initial encounter    Patient's workup is unremarkable. Slightly increased creatinine from baseline, will give IV fluids. Nonfocal abd exam, and based on history I think this is likely chronic. No signs of trauma. She feels unsafe going home tonight and asks for somewhere to stay tonight.  She wants to go home in AM. Discussed with daughter over the phone, she cannot stay with her as she has sick family in house and no room for patient. No one can stay with her tonight. She does not appear acutely ill. Initial O2 sats low but this was with a poor waveform. When a good waveform obtained, has normal sats. Urine currently pending, but since patient appears at her baseline, will watch patient in ED overnight (no indication for admission) and send home in AM (has nurse aids that check on her and goes to [facility]). Care to Dr. Deliah Boston, MD 10/25/15 [phone]

## 2015-10-25 NOTE — ED Notes (Signed)
Bed: WA03 Expected date:  Expected time:  Means of arrival:  Comments: EMS 

## 2015-10-26 LAB — URINALYSIS, ROUTINE W REFLEX MICROSCOPIC
Bilirubin Urine: NEGATIVE
GLUCOSE, UA: NEGATIVE mg/dL
Ketones, ur: 15 mg/dL — AB
LEUKOCYTES UA: NEGATIVE
NITRITE: NEGATIVE
PH: 5 (ref 5.0–8.0)
Protein, ur: NEGATIVE mg/dL
SPECIFIC GRAVITY, URINE: 1.035 — AB (ref 1.005–1.030)

## 2015-10-26 LAB — URINE MICROSCOPIC-ADD ON
BACTERIA UA: NONE SEEN
Squamous Epithelial / HPF: NONE SEEN
WBC UA: NONE SEEN WBC/hpf (ref 0–5)

## 2015-10-26 NOTE — Discharge Instructions (Signed)
Pulmonary Nodule A pulmonary nodule is a small, round growth of tissue in the lung. Pulmonary nodules can range in size from less than 1/5 inch (4 mm) to a little bigger than an inch (25 mm). Most pulmonary nodules are detected when imaging tests of the lung are being performed for a different problem. Pulmonary nodules are usually not cancerous (benign). However, some pulmonary nodules are cancerous (malignant). Follow-up treatment or testing is based on the size of the pulmonary nodule and your risk of getting lung cancer.  CAUSES Benign pulmonary nodules can be caused by various things. Some of the causes include:   Bacterial, fungal, or viral infections. This is usually an old infection that is no longer active, but it can sometimes be a current, active infection.  A benign mass of tissue.  Inflammation from conditions such as rheumatoid arthritis.   Abnormal blood vessels in the lungs. Malignant pulmonary nodules can result from lung cancer or from cancers that spread to the lung from other places in the body. SIGNS AND SYMPTOMS Pulmonary nodules usually do not cause symptoms. DIAGNOSIS Most often, pulmonary nodules are found incidentally when an X-ray or CT scan is performed to look for some other problem in the lung area. To help determine whether a pulmonary nodule is benign or malignant, your health care provider will take a medical history and order a variety of tests. Tests done may include:   Blood tests.  A skin test called a tuberculin test. This test is used to determine if you have been exposed to the germ that causes tuberculosis.   Chest X-rays. If possible, a new X-ray may be compared with X-rays you have had in the past.   CT scan. This test shows smaller pulmonary nodules more clearly than an X-ray.   Positron emission tomography (PET) scan. In this test, a safe amount of a radioactive substance is injected into the bloodstream. Then, the scan takes a picture of  the pulmonary nodule. The radioactive substance is eliminated from your body in your urine.   Biopsy. A tiny piece of the pulmonary nodule is removed so it can be checked under a microscope. TREATMENT  Pulmonary nodules that are benign normally do not require any treatment because they usually do not cause symptoms or breathing problems. Your health care provider may want to monitor the pulmonary nodule through follow-up CT scans. The frequency of these CT scans will vary based on the size of the nodule and the risk factors for lung cancer. For example, CT scans will need to be done more frequently if the pulmonary nodule is larger and if you have a history of smoking and a family history of cancer. Further testing or biopsies may be done if any follow-up CT scan shows that the size of the pulmonary nodule has increased. HOME CARE INSTRUCTIONS  Only take over-the-counter or prescription medicines as directed by your health care provider.  Keep all follow-up appointments with your health care provider. SEEK MEDICAL CARE IF:  You have trouble breathing when you are active.   You feel sick or unusually tired.   You do not feel like eating.   You lose weight without trying to.   You develop chills or night sweats.  SEEK IMMEDIATE MEDICAL CARE IF:  You cannot catch your breath, or you begin wheezing.   You cannot stop coughing.   You cough up blood.   You become dizzy or feel like you are going to pass out.   You  have sudden chest pain.   You have a fever or persistent symptoms for more than 2-3 days.   You have a fever and your symptoms suddenly get worse. MAKE SURE YOU:  Understand these instructions.  Will watch your condition.  Will get help right away if you are not doing well or get worse.   This information is not intended to replace advice given to you by your health care provider. Make sure you discuss any questions you have with your health care  provider.   Document Released: 04/01/2009 Document Revised: 02/04/2013 Document Reviewed: 11/24/2012 Elsevier Interactive Patient Education 2016 [place] in the Home  Falls can cause injuries and can affect people from all age groups. There are many simple things that you can do to make your home safe and to help prevent falls. WHAT CAN I DO ON THE OUTSIDE OF MY HOME?  Regularly repair the edges of walkways and driveways and fix any cracks.  Remove high doorway thresholds.  Trim any shrubbery on the main path into your home.  Use bright outdoor lighting.  Clear walkways of debris and clutter, including tools and rocks.  Regularly check that handrails are securely fastened and in good repair. Both sides of any steps should have handrails.  Install guardrails along the edges of any raised decks or porches.  Have leaves, snow, and ice cleared regularly.  Use sand or salt on walkways during winter months.  In the garage, clean up any spills right away, including grease or oil spills. WHAT CAN I DO IN THE BATHROOM?  Use night lights.  Install grab bars by the toilet and in the tub and shower. Do not use towel bars as grab bars.  Use non-skid mats or decals on the floor of the tub or shower.  If you need to sit down while you are in the shower, use a plastic, non-slip stool.Marland Kitchen  Keep the floor dry. Immediately clean up any water that spills on the floor.  Remove soap buildup in the tub or shower on a regular basis.  Attach bath mats securely with double-sided non-slip rug tape.  Remove throw rugs and other tripping hazards from the floor. WHAT CAN I DO IN THE BEDROOM?  Use night lights.  Make sure that a bedside light is easy to reach.  Do not use oversized bedding that drapes onto the floor.  Have a firm chair that has side arms to use for getting dressed.  Remove throw rugs and other tripping hazards from the floor. WHAT CAN I DO IN THE KITCHEN?    Clean up any spills right away.  Avoid walking on wet floors.  Place frequently used items in easy-to-reach places.  If you need to reach for something above you, use a sturdy step stool that has a grab bar.  Keep electrical cables out of the way.  Do not use floor polish or wax that makes floors slippery. If you have to use wax, make sure that it is non-skid floor wax.  Remove throw rugs and other tripping hazards from the floor. WHAT CAN I DO IN THE STAIRWAYS?  Do not leave any items on the stairs.  Make sure that there are handrails on both sides of the stairs. Fix handrails that are broken or loose. Make sure that handrails are as long as the stairways.  Check any carpeting to make sure that it is firmly attached to the stairs. Fix any carpet that is loose or worn.  Avoid having throw rugs at the top or bottom of stairways, or secure the rugs with carpet tape to prevent them from moving.  Make sure that you have a light switch at the top of the stairs and the bottom of the stairs. If you do not have them, have them installed. WHAT ARE SOME OTHER FALL PREVENTION TIPS?  Wear closed-toe shoes that fit well and support your feet. Wear shoes that have rubber soles or low heels.  When you use a stepladder, make sure that it is completely opened and that the sides are firmly locked. Have someone hold the ladder while you are using it. Do not climb a closed stepladder.  Add color or contrast paint or tape to grab bars and handrails in your home. Place contrasting color strips on the first and last steps.  Use mobility aids as needed, such as canes, walkers, scooters, and crutches.  Turn on lights if it is dark. Replace any light bulbs that burn out.  Set up furniture so that there are clear paths. Keep the furniture in the same spot.  Fix any uneven floor surfaces.  Choose a carpet design that does not hide the edge of steps of a stairway.  Be aware of any and all  pets.  Review your medicines with your healthcare provider. Some medicines can cause dizziness or changes in blood pressure, which increase your risk of falling. Talk with your health care provider about other ways that you can decrease your risk of falls. This may include working with a physical therapist or trainer to improve your strength, balance, and endurance.   This information is not intended to replace advice given to you by your health care provider. Make sure you discuss any questions you have with your health care provider.   Document Released: 05/25/2002 Document Revised: 10/19/2014 Document Reviewed: 07/09/2014 Elsevier Interactive Patient Education [employer].

## 2015-10-26 NOTE — ED Provider Notes (Signed)
Patient signed out pending urine. If negative, plan for discharge home in the morning as patient was by herself. Urine is without evidence of infection. Vital signs of been stable and she is rested comfortably overnight. Will discharge home as planned.  Merryl Hacker, MD 10/26/15 [phone]

## 2015-10-26 NOTE — ED Notes (Signed)
Pt brief diaper changed.

## 2015-10-26 NOTE — ED Notes (Signed)
[facility] called  

## 2015-11-10 ENCOUNTER — Encounter (HOSPITAL_COMMUNITY): Payer: Self-pay

## 2015-11-10 ENCOUNTER — Emergency Department (HOSPITAL_COMMUNITY)
Admission: EM | Admit: 2015-11-10 | Discharge: 2015-11-11 | Disposition: A | Discharge status: Home / Self Care | Payer: Medicare (Managed Care) | Attending: Emergency Medicine | Admitting: Emergency Medicine

## 2015-11-10 ENCOUNTER — Emergency Department (HOSPITAL_BASED_OUTPATIENT_CLINIC_OR_DEPARTMENT_OTHER): Payer: Medicare (Managed Care)

## 2015-11-10 DIAGNOSIS — Z9181 History of falling: Secondary | ICD-10-CM | POA: Insufficient documentation

## 2015-11-10 DIAGNOSIS — J45909 Unspecified asthma, uncomplicated: Secondary | ICD-10-CM | POA: Diagnosis not present

## 2015-11-10 DIAGNOSIS — Z7984 Long term (current) use of oral hypoglycemic drugs: Secondary | ICD-10-CM | POA: Diagnosis not present

## 2015-11-10 DIAGNOSIS — Z9861 Coronary angioplasty status: Secondary | ICD-10-CM | POA: Diagnosis not present

## 2015-11-10 DIAGNOSIS — Z9889 Other specified postprocedural states: Secondary | ICD-10-CM | POA: Insufficient documentation

## 2015-11-10 DIAGNOSIS — Z85828 Personal history of other malignant neoplasm of skin: Secondary | ICD-10-CM | POA: Diagnosis not present

## 2015-11-10 DIAGNOSIS — M79662 Pain in left lower leg: Secondary | ICD-10-CM | POA: Diagnosis present

## 2015-11-10 DIAGNOSIS — E1122 Type 2 diabetes mellitus with diabetic chronic kidney disease: Secondary | ICD-10-CM | POA: Insufficient documentation

## 2015-11-10 DIAGNOSIS — F329 Major depressive disorder, single episode, unspecified: Secondary | ICD-10-CM | POA: Diagnosis not present

## 2015-11-10 DIAGNOSIS — R609 Edema, unspecified: Secondary | ICD-10-CM | POA: Diagnosis not present

## 2015-11-10 DIAGNOSIS — Z87891 Personal history of nicotine dependence: Secondary | ICD-10-CM | POA: Diagnosis not present

## 2015-11-10 DIAGNOSIS — Z8669 Personal history of other diseases of the nervous system and sense organs: Secondary | ICD-10-CM | POA: Diagnosis not present

## 2015-11-10 DIAGNOSIS — Z8673 Personal history of transient ischemic attack (TIA), and cerebral infarction without residual deficits: Secondary | ICD-10-CM | POA: Diagnosis not present

## 2015-11-10 DIAGNOSIS — I129 Hypertensive chronic kidney disease with stage 1 through stage 4 chronic kidney disease, or unspecified chronic kidney disease: Secondary | ICD-10-CM | POA: Insufficient documentation

## 2015-11-10 DIAGNOSIS — Z7902 Long term (current) use of antithrombotics/antiplatelets: Secondary | ICD-10-CM | POA: Diagnosis not present

## 2015-11-10 DIAGNOSIS — M1990 Unspecified osteoarthritis, unspecified site: Secondary | ICD-10-CM | POA: Insufficient documentation

## 2015-11-10 DIAGNOSIS — I82402 Acute embolism and thrombosis of unspecified deep veins of left lower extremity: Secondary | ICD-10-CM | POA: Diagnosis not present

## 2015-11-10 DIAGNOSIS — I2510 Atherosclerotic heart disease of native coronary artery without angina pectoris: Secondary | ICD-10-CM | POA: Insufficient documentation

## 2015-11-10 DIAGNOSIS — Z79899 Other long term (current) drug therapy: Secondary | ICD-10-CM | POA: Diagnosis not present

## 2015-11-10 DIAGNOSIS — F0390 Unspecified dementia without behavioral disturbance: Secondary | ICD-10-CM | POA: Insufficient documentation

## 2015-11-10 DIAGNOSIS — N183 Chronic kidney disease, stage 3 (moderate): Secondary | ICD-10-CM | POA: Diagnosis not present

## 2015-11-10 DIAGNOSIS — Z8719 Personal history of other diseases of the digestive system: Secondary | ICD-10-CM | POA: Diagnosis not present

## 2015-11-10 DIAGNOSIS — Z862 Personal history of diseases of the blood and blood-forming organs and certain disorders involving the immune mechanism: Secondary | ICD-10-CM | POA: Insufficient documentation

## 2015-11-10 LAB — BASIC METABOLIC PANEL
Anion gap: 3 — ABNORMAL LOW (ref 5–15)
BUN: 18 mg/dL (ref 6–20)
CO2: 36 mmol/L — ABNORMAL HIGH (ref 22–32)
Calcium: 9.1 mg/dL (ref 8.9–10.3)
Chloride: 100 mmol/L — ABNORMAL LOW (ref 101–111)
Creatinine, Ser: 1.28 mg/dL — ABNORMAL HIGH (ref 0.44–1.00)
GFR calc Af Amer: 41 mL/min — ABNORMAL LOW (ref >60)
GFR, EST NON AFRICAN AMERICAN: 35 mL/min — AB (ref >60)
GLUCOSE: 361 mg/dL — AB (ref 65–99)
POTASSIUM: 5.1 mmol/L (ref 3.5–5.1)
SODIUM: 139 mmol/L (ref 135–145)

## 2015-11-10 LAB — CBC
HCT: 39.1 % (ref 36.0–46.0)
Hemoglobin: 11.9 g/dL — ABNORMAL LOW (ref 12.0–15.0)
MCH: 30.5 pg (ref 26.0–34.0)
MCHC: 30.4 g/dL (ref 30.0–36.0)
MCV: 100.3 fL — AB (ref 78.0–100.0)
PLATELETS: 154 10*3/uL (ref 150–400)
RBC: 3.9 MIL/uL (ref 3.87–5.11)
RDW: 14.2 % (ref 11.5–15.5)
WBC: 6 10*3/uL (ref 4.0–10.5)

## 2015-11-10 LAB — LACTIC ACID, PLASMA
LACTIC ACID, VENOUS: 1.5 mmol/L (ref 0.5–2.0)
Lactic Acid, Venous: 1 mmol/L (ref 0.5–2.0)

## 2015-11-10 MED ORDER — SODIUM CHLORIDE 0.9 % IV BOLUS (SEPSIS)
250.0000 mL | Freq: Once | INTRAVENOUS | Status: AC
  Administered 2015-11-10: 250 mL via INTRAVENOUS

## 2015-11-10 MED ORDER — APIXABAN 5 MG PO TABS: 5.0000 mg | ORAL_TABLET | Freq: Two times a day (BID) | ORAL | Status: DC

## 2015-11-10 NOTE — Discharge Instructions (Signed)
Return to the ED with any concerns including increased swelling or pain in legs, chest pain, difficulty breathing, fainting, decreased level of alertness/lethargy, or any other alarming symptoms   You should STOP taking ASPIRIN.  YOU SHOULD TAKE PLAVIX AND ELIQUIS, BUT NO ASPIRIN

## 2015-11-10 NOTE — Care Management Note (Signed)
Case Management Note  Patient Details  Name: [name] MRN: [id number] Date of Birth: 1921-07-16  Subjective/Objective:          Patient presented to the [facility] ED  with complaints of pain to the left lower leg      Action/Plan: Patient diagnosed with DVT of the LT Leg starting on Eliquis, as per Dr. Canary Brim.  Patient is active with [facility], CM contacted on call nurse Belinda Rampp RN [phone] to discuss discharge plan.  Per [facility] on call nurse prescription CM will need to be faxed in to [phone] tonight, [facility] Team (Dr. Dorian Pod) will follow-up with patient at home tomorrow.  Updated Dr. Canary Brim with discharge plan.  Expected Discharge Date:      5/225            Expected Discharge Plan:   ([facility])  In-House Referral:     Discharge planning Services  CM Consult  Post Acute Care Choice:    Choice offered to:  Patient  DME Arranged:    DME Agency:     HH Arranged:    [place] Agency:  Other - See comment ([facility])  Status of Service:     Medicare Important Message Given:    Date Medicare IM Given:    Medicare IM give by:    Date Additional Medicare IM Given:    Additional Medicare Important Message give by:     If discussed at [place] of Stay Meetings, dates discussed:    Additional CommentsLaurena Slimmer, RN 11/10/2015, 9:32 PM

## 2015-11-10 NOTE — Progress Notes (Signed)
VASCULAR LAB PRELIMINARY  PRELIMINARY  PRELIMINARY  PRELIMINARY  Bilateral lower extremity venous duplex completed.    Bilateral study completed when there is evidence of a DVT in the requested extremity.  Preliminary report:  Right:  No evidence of DVT, superficial thrombosis, or Baker's cyst. Left: DVT noted in the popliteal and posterior tibial vein.  No evidence of superficial thrombosis.  No Baker's cyst.  [name], RVS 11/10/2015, 7:30 PM

## 2015-11-10 NOTE — ED Notes (Addendum)
Pt brief changed per pt request.

## 2015-11-10 NOTE — ED Notes (Signed)
Paged vascular tech. 

## 2015-11-10 NOTE — ED Notes (Signed)
[facility] called for transport.  

## 2015-11-10 NOTE — ED Provider Notes (Signed)
CSN: [id number]     Arrival date & time 11/10/15  1707 History   First MD Initiated Contact with Patient 11/10/15 1754     Chief Complaint  Patient presents with  . Cellulitis     (Consider location/radiation/quality/duration/timing/severity/associated sxs/prior Treatment) HPI  Pt presenting from home with c/o left lower extremity swelling and pain.  Pt lives alone and pushed her medical alert due to leg swelling.  Pt states she wears compression socks and they were more painful than usual.  No injury to leg, denies chest pain or shortness of breath.  Pt states symptoms began earlier today.  She has chronic leg swelling but states this is worse.  No fever/chills.  There are no other associated systemic symptoms, there are no other alleviating or modifying factors.   Past Medical History  Diagnosis Date  . Vertigo   . Hernia   . CKD (chronic kidney disease), stage III     a. "one kidney rotted" (02/09/2013). (?) b. likely stage III by historical bloodwork.  . Glaucoma, right eye   . GERD (gastroesophageal reflux disease)   . Asthma   . Seasonal allergies   . Type II diabetes mellitus ([place])   . Blood transfusion reaction   . Arthritis     "all over" (02/09/2013)  . Depression   . Skin cancer of nose     "left side" (02/09/2013)  . Stroke [facility]) 1960's    "a light one" (02/09/2013)  . Falls frequently   . CAD (coronary artery disease)     a. prior hx of RCA stent in 1996. b. 12/2013: inferior STEMI s/p extensive PTCA only of occluded RCA (no stents); distal LAD and OM were chronically occluded, EF 55-60%.  Marland Kitchen HTN (hypertension)   . Hyperlipidemia   . Anemia     a. Mild, noted on bloodwork 12/2013.  Marland Kitchen Thrombocytopenia ([place])     a. Mild, noted on bloodwork 12/2013.  . Mild dementia   . Cardiogenic shock ([place])     a. 12/2013: during STEMI with SBP 60s, AV block/bradycardia, resolved with reperfusion.   Past Surgical History  Procedure Laterality Date  . Appendectomy    . Umbilical  hernia repair    . Tubal ligation    . Nephrectomy Left     Archie Endo 02/27/2005 (02/09/2013)  . Eye surgery Left   . Coronary angioplasty with stent placement    . Cardiac catheterization  2005    Archie Endo 05/07/2006 (02/09/2013)  . Vitrectomy Right     Archie Endo 01/19/2002  (02/09/2013)  . Cholecystectomy  02/2005    Archie Endo 02/28/2005 (02/09/2013)  . Left heart catheterization with coronary angiogram Bilateral 01/15/2014    Procedure: LEFT HEART CATHETERIZATION WITH CORONARY ANGIOGRAM;  Surgeon: Peter M Martinique, MD;  Location: [facility] CATH LAB;  Service: Cardiovascular;  Laterality: Bilateral;   Family History  Problem Relation Age of Onset  . Cancer Mother   . Cancer Father   . Diabetes Son   . Diabetes Brother   . Stroke Son   . Stroke Daughter   . Heart attack Neg Hx    Social History  Substance Use Topics  . Smoking status: Former Smoker -- 3.00 packs/day for 32 years    Types: Cigarettes    Quit date: 06/18/1973  . Smokeless tobacco: Never Used  . Alcohol Use: No     Comment: 02/09/2013 "haven't had a drink in 29 yr; used to have a beer once in awhile"   OB History  No data available     Review of Systems  ROS reviewed and all otherwise negative except for mentioned in HPI    Allergies  Fluticasone-salmeterol  Home Medications   Prior to Admission medications   Medication Sig Start Date End Date Taking? Authorizing Provider  acetaminophen (TYLENOL) 650 MG CR tablet Take 650 mg by mouth daily.    Historical Provider, MD  albuterol (PROVENTIL) (2.5 MG/3ML) 0.083% nebulizer solution Take 2.5 mg by nebulization every 6 (six) hours as needed for wheezing or shortness of breath.    Historical Provider, MD  apixaban (ELIQUIS) 5 MG TABS tablet Take 1 tablet (5 mg total) by mouth 2 (two) times daily. 11/10/15   Alfonzo Beers, MD  citalopram (CELEXA) 20 MG tablet Take 20 mg by mouth daily.    Historical Provider, MD  clopidogrel (PLAVIX) 75 MG tablet Take 1 tablet (75 mg total) by mouth  daily. 01/29/14   Liliane Shi, PA-C  glipiZIDE (GLUCOTROL XL) 5 MG 24 hr tablet Take 5 mg by mouth daily with breakfast.    Historical Provider, MD  HYDROcodone-acetaminophen (NORCO/VICODIN) 5-325 MG per tablet Take 0.5-1 tablets by mouth every 6 (six) hours as needed for severe pain. Patient not taking: Reported on 10/25/2015 05/12/14   Sherwood Gambler, MD  metoprolol succinate (TOPROL XL) 25 MG 24 hr tablet Take 1 tablet (25 mg total) by mouth daily. Patient taking differently: Take 12.5 mg by mouth daily.  04/01/14   Liliane Shi, PA-C  nitroGLYCERIN (NITROSTAT) 0.4 MG SL tablet Place 1 tablet (0.4 mg total) under the tongue every 5 (five) minutes as needed for chest pain (up to 3 doses). 01/19/14   Dayna N Dunn, PA-C  pantoprazole (PROTONIX) 40 MG tablet Take 1 tablet (40 mg total) by mouth daily. Patient not taking: Reported on 10/25/2015 01/19/14   Dayna N Dunn, PA-C  senna-docusate (SENNA S) 8.6-50 MG tablet Take 2 tablets by mouth daily.    Historical Provider, MD  Vitamin D, Ergocalciferol, (DRISDOL) 50000 units CAPS capsule Take 50,000 Units by mouth every 30 (thirty) days.    Historical Provider, MD   BP 139/66 mmHg  Pulse 94  Temp(Src) 98.2 F (36.8 C) (Oral)  Resp 16  SpO2 94%  Vitals reviewed Physical Exam  Physical Examination: General appearance - alert, well appearing, and in no distress Mental status - alert, oriented to person, place, and time Mouth - mucous membranes moist, pharynx normal without lesions Chest - clear to auscultation, no wheezes, rales or rhonchi, symmetric air entry Heart - normal rate, regular rhythm, normal S1, S2, no murmurs, rubs, clicks or gallops Abdomen - soft, nontender, nondistended, no masses or organomegaly Neurological - alert, oriented, normal speech, Musculoskeletal - no joint tenderness, deformity or swelling Extremities - peripheral pulses normal, bilateral venous stasis changes , left lower extremity with erythema and increased edema  compared to the right Skin - normal coloration and turgor, no rashes, other than venous stasis as above  ED Course  Procedures (including critical care time) Labs Review Labs Reviewed  CBC - Abnormal; Notable for the following:    Hemoglobin 11.9 (*)    MCV 100.3 (*)    All other components within normal limits  BASIC METABOLIC PANEL - Abnormal; Notable for the following:    Chloride 100 (*)    CO2 36 (*)    Glucose, Bld 361 (*)    Creatinine, Ser 1.28 (*)    GFR calc non Af Amer 35 (*)  GFR calc Af Amer 41 (*)    Anion gap 3 (*)    All other components within normal limits  CULTURE, BLOOD (ROUTINE X 2)  CULTURE, BLOOD (ROUTINE X 2)  LACTIC ACID, PLASMA  LACTIC ACID, PLASMA    Imaging Review No results found. I have personally reviewed and evaluated these images and lab results as part of my medical decision-making.   EKG Interpretation None      MDM   Final diagnoses:  DVT (deep venous thrombosis), left    Pt presenting with c/o left lower extremity swelling and pain, DVT found on ultrasound.    7:53 PM d/w pharmacy to assist with medication management.   8:17 PM d/w pharmacy- they have reviewed chart and recommend stopping ASA and starting on eliquis.  Continuing plavix.  Will consult case management.    8:53 PM d/w Mariann Laster, case management- she will see patient and give recommendations.  Per chart review [facility].    10:51 PM case management has spoken with [facility] nurse- Dr. Jimmye Norman will see patient in her home tomorrow. eliquis prescription was filled prior to her discharge, instructed to stop aspirin and take plavix and eliquis.    Alfonzo Beers, MD 11/12/15 [id number]

## 2015-11-10 NOTE — ED Notes (Signed)
PER EMS: pt from home; Pt pressed her life alert button for bilateral leg swelling and redness. Left lower extremity appears more red per EMS. Hx of Diabetes and is legally blind. BP-130/100, HR-65, CBG-443. A&OX4.

## 2015-11-15 LAB — CULTURE, BLOOD (ROUTINE X 2)
CULTURE: NO GROWTH
Culture: NO GROWTH

## 2017-02-15 ENCOUNTER — Other Ambulatory Visit: Payer: Self-pay | Admitting: Family Medicine

## 2017-02-15 ENCOUNTER — Ambulatory Visit
Admission: RE | Admit: 2017-02-15 | Discharge: 2017-02-15 | Disposition: A | Discharge status: Home / Self Care | Payer: No Typology Code available for payment source | Source: Ambulatory Visit | Attending: Family Medicine | Admitting: Family Medicine

## 2017-02-15 DIAGNOSIS — R059 Cough, unspecified: Secondary | ICD-10-CM

## 2017-02-15 DIAGNOSIS — R05 Cough: Secondary | ICD-10-CM

## 2017-02-26 ENCOUNTER — Ambulatory Visit
Admission: RE | Admit: 2017-02-26 | Discharge: 2017-02-26 | Disposition: A | Discharge status: Home / Self Care | Payer: No Typology Code available for payment source | Source: Ambulatory Visit | Attending: Family Medicine | Admitting: Family Medicine

## 2017-02-26 ENCOUNTER — Other Ambulatory Visit: Payer: Self-pay | Admitting: Family Medicine

## 2017-02-26 DIAGNOSIS — R059 Cough, unspecified: Secondary | ICD-10-CM

## 2017-02-26 DIAGNOSIS — R05 Cough: Secondary | ICD-10-CM

## 2017-04-09 ENCOUNTER — Other Ambulatory Visit: Payer: Self-pay | Admitting: Family Medicine

## 2017-04-09 ENCOUNTER — Ambulatory Visit
Admission: RE | Admit: 2017-04-09 | Discharge: 2017-04-09 | Disposition: A | Discharge status: Home / Self Care | Payer: Medicare (Managed Care) | Source: Ambulatory Visit | Attending: Family Medicine | Admitting: Family Medicine

## 2017-04-09 DIAGNOSIS — R059 Cough, unspecified: Secondary | ICD-10-CM

## 2017-04-09 DIAGNOSIS — R05 Cough: Secondary | ICD-10-CM

## 2017-11-13 ENCOUNTER — Other Ambulatory Visit: Payer: Self-pay

## 2017-11-13 ENCOUNTER — Inpatient Hospital Stay (HOSPITAL_COMMUNITY)
Admission: EM | Admit: 2017-11-13 | Discharge: 2017-11-17 | DRG: 280 | Disposition: A | Discharge status: Skilled Nursing Facility | Payer: Medicare (Managed Care) | Attending: Internal Medicine | Admitting: Internal Medicine

## 2017-11-13 ENCOUNTER — Emergency Department (HOSPITAL_COMMUNITY): Payer: Medicare (Managed Care)

## 2017-11-13 ENCOUNTER — Encounter (HOSPITAL_COMMUNITY): Payer: Self-pay | Admitting: *Deleted

## 2017-11-13 DIAGNOSIS — I129 Hypertensive chronic kidney disease with stage 1 through stage 4 chronic kidney disease, or unspecified chronic kidney disease: Secondary | ICD-10-CM | POA: Diagnosis present

## 2017-11-13 DIAGNOSIS — H548 Legal blindness, as defined in USA: Secondary | ICD-10-CM | POA: Diagnosis not present

## 2017-11-13 DIAGNOSIS — Z8673 Personal history of transient ischemic attack (TIA), and cerebral infarction without residual deficits: Secondary | ICD-10-CM

## 2017-11-13 DIAGNOSIS — Z86718 Personal history of other venous thrombosis and embolism: Secondary | ICD-10-CM

## 2017-11-13 DIAGNOSIS — E7800 Pure hypercholesterolemia, unspecified: Secondary | ICD-10-CM | POA: Diagnosis present

## 2017-11-13 DIAGNOSIS — I252 Old myocardial infarction: Secondary | ICD-10-CM | POA: Diagnosis not present

## 2017-11-13 DIAGNOSIS — R109 Unspecified abdominal pain: Secondary | ICD-10-CM | POA: Diagnosis not present

## 2017-11-13 DIAGNOSIS — Z85828 Personal history of other malignant neoplasm of skin: Secondary | ICD-10-CM | POA: Diagnosis not present

## 2017-11-13 DIAGNOSIS — N390 Urinary tract infection, site not specified: Secondary | ICD-10-CM | POA: Diagnosis not present

## 2017-11-13 DIAGNOSIS — Z9049 Acquired absence of other specified parts of digestive tract: Secondary | ICD-10-CM | POA: Diagnosis not present

## 2017-11-13 DIAGNOSIS — I639 Cerebral infarction, unspecified: Secondary | ICD-10-CM | POA: Diagnosis present

## 2017-11-13 DIAGNOSIS — Z79899 Other long term (current) drug therapy: Secondary | ICD-10-CM

## 2017-11-13 DIAGNOSIS — I2510 Atherosclerotic heart disease of native coronary artery without angina pectoris: Secondary | ICD-10-CM | POA: Diagnosis present

## 2017-11-13 DIAGNOSIS — R8271 Bacteriuria: Secondary | ICD-10-CM

## 2017-11-13 DIAGNOSIS — N183 Chronic kidney disease, stage 3 unspecified: Secondary | ICD-10-CM | POA: Diagnosis present

## 2017-11-13 DIAGNOSIS — E785 Hyperlipidemia, unspecified: Secondary | ICD-10-CM | POA: Diagnosis present

## 2017-11-13 DIAGNOSIS — Z905 Acquired absence of kidney: Secondary | ICD-10-CM

## 2017-11-13 DIAGNOSIS — I130 Hypertensive heart and chronic kidney disease with heart failure and stage 1 through stage 4 chronic kidney disease, or unspecified chronic kidney disease: Secondary | ICD-10-CM | POA: Diagnosis not present

## 2017-11-13 DIAGNOSIS — E11319 Type 2 diabetes mellitus with unspecified diabetic retinopathy without macular edema: Secondary | ICD-10-CM

## 2017-11-13 DIAGNOSIS — Z833 Family history of diabetes mellitus: Secondary | ICD-10-CM

## 2017-11-13 DIAGNOSIS — I313 Pericardial effusion (noninflammatory): Secondary | ICD-10-CM | POA: Diagnosis present

## 2017-11-13 DIAGNOSIS — Z888 Allergy status to other drugs, medicaments and biological substances status: Secondary | ICD-10-CM

## 2017-11-13 DIAGNOSIS — M1990 Unspecified osteoarthritis, unspecified site: Secondary | ICD-10-CM | POA: Diagnosis present

## 2017-11-13 DIAGNOSIS — R112 Nausea with vomiting, unspecified: Secondary | ICD-10-CM | POA: Diagnosis not present

## 2017-11-13 DIAGNOSIS — H3530 Unspecified macular degeneration: Secondary | ICD-10-CM | POA: Diagnosis present

## 2017-11-13 DIAGNOSIS — Z7902 Long term (current) use of antithrombotics/antiplatelets: Secondary | ICD-10-CM

## 2017-11-13 DIAGNOSIS — H409 Unspecified glaucoma: Secondary | ICD-10-CM | POA: Diagnosis present

## 2017-11-13 DIAGNOSIS — H5702 Anisocoria: Secondary | ICD-10-CM | POA: Diagnosis not present

## 2017-11-13 DIAGNOSIS — I5021 Acute systolic (congestive) heart failure: Secondary | ICD-10-CM | POA: Diagnosis not present

## 2017-11-13 DIAGNOSIS — F329 Major depressive disorder, single episode, unspecified: Secondary | ICD-10-CM | POA: Diagnosis present

## 2017-11-13 DIAGNOSIS — Z87891 Personal history of nicotine dependence: Secondary | ICD-10-CM

## 2017-11-13 DIAGNOSIS — Z809 Family history of malignant neoplasm, unspecified: Secondary | ICD-10-CM

## 2017-11-13 DIAGNOSIS — E1122 Type 2 diabetes mellitus with diabetic chronic kidney disease: Secondary | ICD-10-CM | POA: Diagnosis present

## 2017-11-13 DIAGNOSIS — I214 Non-ST elevation (NSTEMI) myocardial infarction: Secondary | ICD-10-CM | POA: Diagnosis present

## 2017-11-13 DIAGNOSIS — K219 Gastro-esophageal reflux disease without esophagitis: Secondary | ICD-10-CM | POA: Diagnosis present

## 2017-11-13 DIAGNOSIS — Z66 Do not resuscitate: Secondary | ICD-10-CM | POA: Diagnosis not present

## 2017-11-13 DIAGNOSIS — I420 Dilated cardiomyopathy: Secondary | ICD-10-CM

## 2017-11-13 DIAGNOSIS — B961 Klebsiella pneumoniae [K. pneumoniae] as the cause of diseases classified elsewhere: Secondary | ICD-10-CM | POA: Diagnosis not present

## 2017-11-13 DIAGNOSIS — F0390 Unspecified dementia without behavioral disturbance: Secondary | ICD-10-CM | POA: Diagnosis not present

## 2017-11-13 DIAGNOSIS — Z823 Family history of stroke: Secondary | ICD-10-CM | POA: Diagnosis not present

## 2017-11-13 DIAGNOSIS — I340 Nonrheumatic mitral (valve) insufficiency: Secondary | ICD-10-CM | POA: Diagnosis not present

## 2017-11-13 DIAGNOSIS — Z955 Presence of coronary angioplasty implant and graft: Secondary | ICD-10-CM

## 2017-11-13 DIAGNOSIS — D631 Anemia in chronic kidney disease: Secondary | ICD-10-CM | POA: Diagnosis present

## 2017-11-13 DIAGNOSIS — I2583 Coronary atherosclerosis due to lipid rich plaque: Secondary | ICD-10-CM | POA: Diagnosis not present

## 2017-11-13 DIAGNOSIS — Z7901 Long term (current) use of anticoagulants: Secondary | ICD-10-CM | POA: Diagnosis not present

## 2017-11-13 DIAGNOSIS — E1139 Type 2 diabetes mellitus with other diabetic ophthalmic complication: Secondary | ICD-10-CM | POA: Diagnosis not present

## 2017-11-13 DIAGNOSIS — J9811 Atelectasis: Secondary | ICD-10-CM | POA: Diagnosis not present

## 2017-11-13 DIAGNOSIS — I255 Ischemic cardiomyopathy: Secondary | ICD-10-CM | POA: Diagnosis present

## 2017-11-13 LAB — CBC WITH DIFFERENTIAL/PLATELET
BAND NEUTROPHILS: 0 %
BASOS ABS: 0 10*3/uL (ref 0.0–0.1)
BASOS PCT: 0 %
Eosinophils Absolute: 0.1 10*3/uL (ref 0.0–0.7)
Eosinophils Relative: 1 %
HEMATOCRIT: 42.8 % (ref 36.0–46.0)
Hemoglobin: 13.1 g/dL (ref 12.0–15.0)
LYMPHS ABS: 0.6 10*3/uL — AB (ref 0.7–4.0)
Lymphocytes Relative: 7 %
MCH: 30.9 pg (ref 26.0–34.0)
MCHC: 30.6 g/dL (ref 30.0–36.0)
MCV: 100.9 fL — ABNORMAL HIGH (ref 78.0–100.0)
MONOS PCT: 4 %
Monocytes Absolute: 0.4 10*3/uL (ref 0.1–1.0)
NEUTROS ABS: 8.4 10*3/uL — AB (ref 1.7–7.7)
NEUTROS PCT: 88 %
Platelets: 131 10*3/uL — ABNORMAL LOW (ref 150–400)
RBC: 4.24 MIL/uL (ref 3.87–5.11)
RDW: 14.4 % (ref 11.5–15.5)
WBC: 9.6 10*3/uL (ref 4.0–10.5)

## 2017-11-13 LAB — URINALYSIS, ROUTINE W REFLEX MICROSCOPIC
BILIRUBIN URINE: NEGATIVE
KETONES UR: NEGATIVE mg/dL
NITRITE: NEGATIVE
PROTEIN: NEGATIVE mg/dL
Specific Gravity, Urine: 1.013 (ref 1.005–1.030)
pH: 5 (ref 5.0–8.0)

## 2017-11-13 LAB — I-STAT CG4 LACTIC ACID, ED
Lactic Acid, Venous: 0.75 mmol/L (ref 0.5–1.9)
Lactic Acid, Venous: 0.8 mmol/L (ref 0.5–1.9)

## 2017-11-13 LAB — COMPREHENSIVE METABOLIC PANEL
ALBUMIN: 3.3 g/dL — AB (ref 3.5–5.0)
ALT: 15 U/L (ref 14–54)
ANION GAP: 9 (ref 5–15)
AST: 25 U/L (ref 15–41)
Alkaline Phosphatase: 88 U/L (ref 38–126)
BUN: 30 mg/dL — ABNORMAL HIGH (ref 6–20)
CO2: 29 mmol/L (ref 22–32)
Calcium: 9.2 mg/dL (ref 8.9–10.3)
Chloride: 101 mmol/L (ref 101–111)
Creatinine, Ser: 1.29 mg/dL — ABNORMAL HIGH (ref 0.44–1.00)
GFR calc Af Amer: 40 mL/min — ABNORMAL LOW (ref >60)
GFR calc non Af Amer: 35 mL/min — ABNORMAL LOW (ref >60)
GLUCOSE: 255 mg/dL — AB (ref 65–99)
Potassium: 4.5 mmol/L (ref 3.5–5.1)
SODIUM: 139 mmol/L (ref 135–145)
TOTAL PROTEIN: 6.9 g/dL (ref 6.5–8.1)
Total Bilirubin: 0.8 mg/dL (ref 0.3–1.2)

## 2017-11-13 LAB — I-STAT TROPONIN, ED: Troponin i, poc: 1.55 ng/mL (ref 0.00–0.08)

## 2017-11-13 LAB — LIPASE, BLOOD: LIPASE: 31 U/L (ref 11–51)

## 2017-11-13 LAB — POC OCCULT BLOOD, ED: Fecal Occult Bld: POSITIVE — AB

## 2017-11-13 MED ORDER — GLUCERNA SHAKE PO LIQD
237.0000 mL | Freq: Every day | ORAL | Status: DC
Start: 2017-11-14 — End: 2017-11-17
  Administered 2017-11-14 – 2017-11-17 (×4): 237 mL via ORAL
  Filled 2017-11-13 (×2): qty 237

## 2017-11-13 MED ORDER — INSULIN ASPART 100 UNIT/ML ~~LOC~~ SOLN
0.0000 [IU] | Freq: Three times a day (TID) | SUBCUTANEOUS | Status: DC
  Administered 2017-11-15: 1 [IU] via SUBCUTANEOUS
  Administered 2017-11-15: 5 [IU] via SUBCUTANEOUS
  Administered 2017-11-16 – 2017-11-17 (×2): 1 [IU] via SUBCUTANEOUS

## 2017-11-13 MED ORDER — ACETAMINOPHEN 325 MG PO TABS: 650.0000 mg | ORAL_TABLET | Freq: Four times a day (QID) | ORAL | Status: DC | PRN

## 2017-11-13 MED ORDER — ONDANSETRON HCL 4 MG PO TABS
4.0000 mg | ORAL_TABLET | Freq: Four times a day (QID) | ORAL | Status: DC | PRN
Start: 2017-11-13 — End: 2017-11-17

## 2017-11-13 MED ORDER — ASPIRIN 81 MG PO CHEW
324.0000 mg | CHEWABLE_TABLET | Freq: Once | ORAL | Status: AC
  Administered 2017-11-14: 324 mg via ORAL
  Filled 2017-11-13: qty 4

## 2017-11-13 MED ORDER — SENNOSIDES-DOCUSATE SODIUM 8.6-50 MG PO TABS: 1.0000 | ORAL_TABLET | Freq: Every evening | ORAL | Status: DC | PRN

## 2017-11-13 MED ORDER — SERTRALINE HCL 100 MG PO TABS
100.0000 mg | ORAL_TABLET | Freq: Every day | ORAL | Status: DC
  Administered 2017-11-14 – 2017-11-17 (×4): 100 mg via ORAL
  Filled 2017-11-13 (×4): qty 1

## 2017-11-13 MED ORDER — ONDANSETRON HCL 4 MG/2ML IJ SOLN: 4.0000 mg | Freq: Four times a day (QID) | INTRAMUSCULAR | Status: DC | PRN

## 2017-11-13 MED ORDER — ACETAMINOPHEN 650 MG RE SUPP: 650.0000 mg | Freq: Four times a day (QID) | RECTAL | Status: DC | PRN

## 2017-11-13 MED ORDER — HEPARIN SODIUM (PORCINE) 5000 UNIT/ML IJ SOLN
5000.0000 [IU] | Freq: Three times a day (TID) | INTRAMUSCULAR | Status: DC
  Administered 2017-11-14: 5000 [IU] via SUBCUTANEOUS
  Filled 2017-11-13: qty 1

## 2017-11-13 MED ORDER — METOPROLOL SUCCINATE 12.5 MG HALF TABLET
12.5000 mg | ORAL_TABLET | Freq: Every day | ORAL | Status: DC
  Filled 2017-11-13 (×2): qty 1

## 2017-11-13 MED ORDER — POLYVINYL ALCOHOL 1.4 % OP SOLN
1.0000 [drp] | Freq: Two times a day (BID) | OPHTHALMIC | Status: DC | PRN
  Filled 2017-11-13: qty 15

## 2017-11-13 NOTE — ED Provider Notes (Signed)
Level V caveat dementia patient reportedly vomited one time earlier today and complained of epigastric pain. She was treated with Phenergan and Zofran at [facility] facility. She is presently asymptomatic and states she is hungry. EMS reports that her pulse oximetry on room air was 87% en route. Treated patient with supplement oxygen. On exam patient is alert and pleasantly confused no distress lungs clear auscultation respiratory rate counted 16 breaths per minute by meheart regular rate and rhythm abdomen normal active bowel sounds nondistended, minimally tender at epigastrium   Orlie Dakin, MD 11/13/17 2345

## 2017-11-13 NOTE — ED Triage Notes (Signed)
Pt From  [facility] with ABD pain and a UTI. Pt confused at base line with a Hx of Dementia.  Per EMS Pt DX with UTI at SNF. Pt received Zofran and Phenergan  For nausea prior to arrival.

## 2017-11-13 NOTE — H&P (Signed)
Date: 11/13/2017               Patient Name:  [name] MRN: [id number]  DOB: 31-Dec-1921 Age / Sex: 84 y.o., female   PCP: Janifer Adie, MD         Medical Service: Internal Medicine Teaching Service         Attending Physician: Dr. Evette Doffing, Mallie Mussel, *    First Contact: Dr. Maricela Bo Pager: [phone]  Second Contact: Dr. Philipp Ovens Pager: [phone]       After Hours (After 5p/  First Contact Pager: [phone]  weekends / holidays): Second Contact Pager: [phone]   Chief Complaint: Abdominal pain with nausea and vomiting  History of Present Illness: Ms Suitt is a 84 yo F with Hx of CAD (RCA stent  1996, STEMI in 2015 with PCA but no stent), CKD III, DM, HTN, GERD, OA, Macular degeneration, Dementia, Hard of Hearing, DVT (2017) who presented with abdominal pain and nausea/vomting. Patient is unable to provided reliable history due to her chronic dementia, history obtained via chart review. [facility] from [facility] where she was being evaluated for abdominal pain. Patient had reportedly been experiencing abdominal pain since the morning and had begun to vomit. She did not respond to Zofran or phenergan initially and EMS was called to bring the patient to the ED. Troponin in the ED elevated to 1.55. Patient endorse some shortness of breath. She denied abdominal pain by the time of exam. She also denied chest pain, fevers, or chills (states she's always cold).  In the ED: vital signs were stable. CBC showed Plt 131; CMP showed Cr 1.29 (consisted with her CKD), Glucose 255; Lactate and lipase WNL; UA showed >500 glucose, small Hgb, Trace leukocytes, and rare bacteria. Urine and blood cultures were obtained. EKG showed NSR, low voltage (similar to prior), with new t-wave inversions in I and aVL. CXR showed small L effusion with L basilar atelectasis and RLL opacity favoring atelectasis. Cardiology was consulted in the ED. Patient to be admitted for further workup and care.  Meds:    Current Meds  Medication Sig  . acetaminophen (TYLENOL) 650 MG CR tablet Take 650 mg by mouth daily.  Marland Kitchen acetaminophen (TYLENOL) 650 MG CR tablet Take 650 mg by mouth at bedtime as needed for pain.  Marland Kitchen Dextran 70-Hypromellose (NATURES TEARS) 0.1-0.3 % SOLN Place 1 drop into both eyes 2 (two) times daily as needed (for dry eyes).  . furosemide (LASIX) 20 MG tablet Take 10 mg by mouth daily.  Marland Kitchen glipiZIDE (GLUCOTROL XL) 5 MG 24 hr tablet Take 5 mg by mouth daily with breakfast.  . GLUCERNA (GLUCERNA) LIQD Take 237 mLs by mouth daily.  Marland Kitchen guaiFENesin (ROBITUSSIN) 100 MG/5ML SOLN Take 10 mLs by mouth every 6 (six) hours as needed for cough or to loosen phlegm.  . loperamide (IMODIUM A-D) 2 MG tablet Take 2 mg by mouth as needed for diarrhea or loose stools.  . metoprolol succinate (TOPROL XL) 25 MG 24 hr tablet Take 1 tablet (25 mg total) by mouth daily. (Patient taking differently: Take 12.5 mg by mouth daily. )  . nitroGLYCERIN (NITROSTAT) 0.4 MG SL tablet Place 1 tablet (0.4 mg total) under the tongue every 5 (five) minutes as needed for chest pain (up to 3 doses).  . ondansetron (ZOFRAN-ODT) 4 MG disintegrating tablet Take 4 mg by mouth as needed for nausea or vomiting.  . pantoprazole (PROTONIX) 20 MG tablet Take 20 mg by mouth  daily.  . senna-docusate (SENNA S) 8.6-50 MG tablet Take 1-2 tablets by mouth See admin instructions. Take 2 tablets every morning then take 1 tablet every evening  . sertraline (ZOLOFT) 100 MG tablet Take 100 mg by mouth daily.  . Triamcinolone Acetonide (TRIAMCINOLONE 0.1 % CREAM : EUCERIN) CREA Apply 1 application topically 2 (two) times daily as needed for rash.  . Vitamin D, Ergocalciferol, (DRISDOL) 50000 units CAPS capsule Take 50,000 Units by mouth every 30 (thirty) days.     Allergies: Allergies as of 11/13/2017 - Review Complete 11/13/2017  Allergen Reaction Noted  . Fluticasone-salmeterol Other (See Comments) 07/07/2009  . Statins Other (See Comments)  11/13/2017   Past Medical History:  Diagnosis Date  . Anemia    a. Mild, noted on bloodwork 12/2013.  Marland Kitchen Arthritis    "all over" (02/09/2013)  . Asthma   . Blood transfusion reaction   . CAD (coronary artery disease)    a. prior hx of RCA stent in 1996. b. 12/2013: inferior STEMI s/p extensive PTCA only of occluded RCA (no stents); distal LAD and OM were chronically occluded, EF 55-60%.  . Cardiogenic shock ([place])    a. 12/2013: during STEMI with SBP 60s, AV block/bradycardia, resolved with reperfusion.  . CKD (chronic kidney disease), stage III ([place])    a. "one kidney rotted" (02/09/2013). (?) b. likely stage III by historical bloodwork.  . Depression   . Falls frequently   . GERD (gastroesophageal reflux disease)   . Glaucoma, right eye   . Hernia   . HTN (hypertension)   . Hyperlipidemia   . Mild dementia   . Seasonal allergies   . Skin cancer of nose    "left side" (02/09/2013)  . Stroke [facility]) 1960's   "a light one" (02/09/2013)  . Thrombocytopenia ([place])    a. Mild, noted on bloodwork 12/2013.  . Type II diabetes mellitus ([place])   . Vertigo     Family History: Family History  Problem Relation Age of Onset  . Cancer Mother   . Cancer Father   . Diabetes Son   . Diabetes Brother   . Stroke Son   . Stroke Daughter   . Heart attack Neg Hx   - Reviewed on admission  Social History: Social History   Tobacco Use  . Smoking status: Former Smoker    Packs/day: 3.00    Years: 32.00    Pack years: 96.00    Types: Cigarettes    Last attempt to quit: 06/18/1973    Years since quitting: 44.4  . Smokeless tobacco: Never Used  Substance Use Topics  . Alcohol use: No    Comment: 02/09/2013 "haven't had a drink in 29 yr; used to have a beer once in awhile"  . Drug use: No  - Reviewed on admission  Review of Systems: A complete ROS was negative except as per HPI.  Physical Exam: Blood pressure 128/65, pulse 88, temperature 97.7 F (36.5 C), temperature source Rectal, height  '5\' 1"'  (1.549 m), weight 135 lb (61.2 kg), SpO2 98 %. Physical Exam  Constitutional: She appears well-developed. No distress.  Thin, elderly female  HENT:  Head: Normocephalic and atraumatic.  Eyes: EOM are normal. Right eye exhibits no discharge. Left eye exhibits no discharge.  Cardiovascular: Normal rate, regular rhythm, normal heart sounds and intact distal pulses.  Pulmonary/Chest: Effort normal and breath sounds normal. No respiratory distress.  Abdominal: Soft. Bowel sounds are normal. She exhibits no distension. There is no tenderness.  EKG: personally reviewed my interpretation is NSR, low voltage (similar to prior), with new t-wave inversions in I and aVL.    CXR: personally reviewed my interpretation is small L effusion with L basilar opacity favoring atelectasis and RLL opacity.  Assessment & Plan by Problem: 84 yo F with Hx of CAD (RCA stent  1996, STEMI in 2015 with PCA but no stent), CKD III, DM, HTN, GERD, OA, Macular degeneration, Dementia, Hard of Hearing, DVT (2017) who presented with abdominal pain and nausea/vomting.  NSTEMI: Patient presented with abdominal pain and nausea with vomiting initially not responsive to anti emetics that resolved by the time of admission. She was found to have elevated troponin of 1.55 in the setting of a history of CAD s/p RCA stent 1996 and STEMI in 2015 with PCA but no stent. Cath in 2015 showed chronically occluded distal LAD and OM with EF 55-60%, per 2016 cardiology note. Patient noted to be heme occult positive in the ED, will hold Heparin for now given risk of bleeding. > Previously on Plavix, but no longer. > Initial Troponin 1.55 - Cardiology consulted in the ED, appreciate their recommendations - ASA 3481m - Home metoprolol 234mDaily - Echocardiogram - Trend troponin - AM EKG  Dementia: History of dementia. Alert and oriented but unable to provided reliable history. Daughter (NIzora Galaia) contacted and states she is POA, but  patient does not have formal HCPOA.   CKD III: Stable with Cr 1.29 and eGFR 35. - Avoid Nephrotoxic Agents  DM: last A1c 9.3 (02/09/13). On Glipizide 81m2maily. - SSI  HTN: On Furosemide 33m34mily, Metoprolol 281mg59mly. Normotensive here. - Holding home Furosemide - Continue Metoprolol 281mg 48my  GERD: On pantoprazole 33mg D7m at home  FEN: HH, CM Diet, Glucerna Daily VTE ppx: Heparin Code Status: FULL  Dispo: Admit patient to Inpatient with expected length of stay greater than 2 midnights.  Signed: Melvin,Neva Seat29/2019, 11:19 PM  Pager: [phone]

## 2017-11-13 NOTE — ED Provider Notes (Addendum)
[facility] EMERGENCY DEPARTMENT Provider Note   CSN: [id number] Arrival date & time: 11/13/17  1728     History   Chief Complaint Chief Complaint  Patient presents with  . Abdominal Pain    HPI [name] is a 84 y.o. female [facility].  Patient has history of dementia and confused at baseline.  History is provided by patient, triage note and on call RN from [facility].  Patient is oriented to self, place, day.  She reports pain around her bellybutton, nausea, vomiting.  She is unsure of fevers. Level 5 caveat applies given h/o dementia. RN reports patient complained of epigastric abdominal pain, nausea and vomited x 1.    HPI  Past Medical History:  Diagnosis Date  . Anemia    a. Mild, noted on bloodwork 12/2013.  Marland Kitchen Arthritis    "all over" (02/09/2013)  . Asthma   . Blood transfusion reaction   . CAD (coronary artery disease)    a. prior hx of RCA stent in 1996. b. 12/2013: inferior STEMI s/p extensive PTCA only of occluded RCA (no stents); distal LAD and OM were chronically occluded, EF 55-60%.  . Cardiogenic shock ([place])    a. 12/2013: during STEMI with SBP 60s, AV block/bradycardia, resolved with reperfusion.  . CKD (chronic kidney disease), stage III ([place])    a. "one kidney rotted" (02/09/2013). (?) b. likely stage III by historical bloodwork.  . Depression   . Falls frequently   . GERD (gastroesophageal reflux disease)   . Glaucoma, right eye   . Hernia   . HTN (hypertension)   . Hyperlipidemia   . Mild dementia   . Seasonal allergies   . Skin cancer of nose    "left side" (02/09/2013)  . Stroke [facility]) 1960's   "a light one" (02/09/2013)  . Thrombocytopenia ([place])    a. Mild, noted on bloodwork 12/2013.  . Type II diabetes mellitus ([place])   . Vertigo     Patient Active Problem List   Diagnosis Date Noted  . NSTEMI (non-ST elevated myocardial infarction) ([place]) 11/13/2017  . Bruising 08/18/2014  .  HOH (hard of hearing) 08/18/2014  . CAD (coronary artery disease)   . CKD (chronic kidney disease), stage III ([place])   . Mild dementia   . Thrombocytopenia ([place])   . Anemia   . Cardiogenic shock ([place])   . ST elevation myocardial infarction (STEMI) involving right coronary artery with complication ([place]) 95/18/8416  . Fall 02/09/2013  . DYSPNEA ON EXERTION 07/07/2009  . URINARY INCONTINENCE 07/07/2009  . Essential hypertension, benign 04/12/2009  . VERTIGO 11/22/2008  . CONSTIPATION 03/31/2007  . DIABETES MELLITUS, II, COMPLICATIONS 60/63/0160  . HYPERCHOLESTEROLEMIA 08/15/2006  . OBESITY, NOS 08/15/2006  . DEPRESSION, MAJOR, RECURRENT 08/15/2006  . MACULAR DEGENERATION 08/15/2006  . MYOCARDIAL INFARCTION, OLD 08/15/2006  . CLAUDICATION, INTERMITTENT 08/15/2006  . RHINITIS, ALLERGIC 08/15/2006  . GASTROESOPHAGEAL REFLUX, NO ESOPHAGITIS 08/15/2006  . OSTEOARTHRITIS OF SPINE, NOS 08/15/2006    Past Surgical History:  Procedure Laterality Date  . APPENDECTOMY    . CARDIAC CATHETERIZATION  2005   Archie Endo 05/07/2006 (02/09/2013)  . CHOLECYSTECTOMY  02/2005   Archie Endo 02/28/2005 (02/09/2013)  . CORONARY ANGIOPLASTY WITH STENT PLACEMENT    . EYE SURGERY Left   . LEFT HEART CATHETERIZATION WITH CORONARY ANGIOGRAM Bilateral 01/15/2014   Procedure: LEFT HEART CATHETERIZATION WITH CORONARY ANGIOGRAM;  Surgeon: Peter M Martinique, MD;  Location: [facility]  CATH LAB;  Service: Cardiovascular;  Laterality: Bilateral;  . NEPHRECTOMY Left    Archie Endo 02/27/2005 (02/09/2013)  . TUBAL LIGATION    . UMBILICAL HERNIA REPAIR    . VITRECTOMY Right    Archie Endo 01/19/2002  (02/09/2013)     OB History   None      Home Medications    Prior to Admission medications   Medication Sig Start Date End Date Taking? Authorizing Provider  acetaminophen (TYLENOL) 650 MG CR tablet Take 650 mg by mouth daily.    [provider]  albuterol (PROVENTIL) (2.5 MG/3ML) 0.083% nebulizer solution Take 2.5 mg by nebulization  every 6 (six) hours as needed for wheezing or shortness of breath.    [provider]  apixaban (ELIQUIS) 5 MG TABS tablet Take 1 tablet (5 mg total) by mouth 2 (two) times daily. 11/10/15   Mabe, Forbes Cellar, MD  citalopram (CELEXA) 20 MG tablet Take 20 mg by mouth daily.    [provider]  clopidogrel (PLAVIX) 75 MG tablet Take 1 tablet (75 mg total) by mouth daily. 01/29/14   Richardson Dopp T, PA-C  glipiZIDE (GLUCOTROL XL) 5 MG 24 hr tablet Take 5 mg by mouth daily with breakfast.    [provider]  HYDROcodone-acetaminophen (NORCO/VICODIN) 5-325 MG per tablet Take 0.5-1 tablets by mouth every 6 (six) hours as needed for severe pain. Patient not taking: Reported on 10/25/2015 05/12/14   Sherwood Gambler, MD  metoprolol succinate (TOPROL XL) 25 MG 24 hr tablet Take 1 tablet (25 mg total) by mouth daily. Patient taking differently: Take 12.5 mg by mouth daily.  04/01/14   Richardson Dopp T, PA-C  nitroGLYCERIN (NITROSTAT) 0.4 MG SL tablet Place 1 tablet (0.4 mg total) under the tongue every 5 (five) minutes as needed for chest pain (up to 3 doses). 01/19/14   Dunn, Nedra Hai, PA-C  pantoprazole (PROTONIX) 40 MG tablet Take 1 tablet (40 mg total) by mouth daily. Patient not taking: Reported on 10/25/2015 01/19/14   Charlie Pitter, PA-C  senna-docusate (SENNA S) 8.6-50 MG tablet Take 2 tablets by mouth daily.    [provider]  Vitamin D, Ergocalciferol, (DRISDOL) 50000 units CAPS capsule Take 50,000 Units by mouth every 30 (thirty) days.    [provider]    Family History Family History  Problem Relation Age of Onset  . Cancer Mother   . Cancer Father   . Diabetes Son   . Diabetes Brother   . Stroke Son   . Stroke Daughter   . Heart attack Neg Hx     Social History Social History   Tobacco Use  . Smoking status: Former Smoker    Packs/day: 3.00    Years: 32.00    Pack years: 96.00    Types: Cigarettes    Last attempt to quit: 06/18/1973    Years  since quitting: 44.4  . Smokeless tobacco: Never Used  Substance Use Topics  . Alcohol use: No    Comment: 02/09/2013 "haven't had a drink in 29 yr; used to have a beer once in awhile"  . Drug use: No     Allergies   Fluticasone-salmeterol   Review of Systems Review of Systems  Constitutional: Positive for chills.  Gastrointestinal: Positive for abdominal pain, nausea and vomiting.  Genitourinary: Positive for difficulty urinating.  All other systems reviewed and are negative.    Physical Exam Updated Vital Signs BP 128/65 (BP Location: Right Arm)   Pulse 88  Temp 97.7 F (36.5 C) (Rectal)   Ht 5\' 1"  (1.549 m)   Wt 61.2 kg (135 lb)   SpO2 98%   BMI 25.51 kg/m   Physical Exam  Constitutional: She is oriented to person, place, and time. She appears well-developed and well-nourished. No distress.  Non toxic. Pleasantly confused.   HENT:  Head: Normocephalic and atraumatic.  Nose: Nose normal.  Moist mucous membranes   Eyes: Pupils are equal, round, and reactive to light. Conjunctivae and EOM are normal.  Neck: Normal range of motion.  Cardiovascular: Normal rate, regular rhythm and intact distal pulses.  2+ DP and radial pulses bilaterally. No LE edema.   Pulmonary/Chest: Effort normal and breath sounds normal.  Abdominal: Soft. Bowel sounds are normal. There is generalized tenderness.  No G/R/R. No CVA tenderness. Negative Murphy's and McBurney's.   Musculoskeletal: Normal range of motion.  Neurological: She is alert and oriented to person, place, and time.  Skin: Skin is warm and dry. Capillary refill takes less than 2 seconds.  Psychiatric: She has a normal mood and affect. Her behavior is normal.  Nursing note and vitals reviewed.    ED Treatments / Results  Labs (all labs ordered are listed, but only abnormal results are displayed) Labs Reviewed  COMPREHENSIVE METABOLIC PANEL - Abnormal; Notable for the following components:      Result Value   Glucose,  Bld 255 (*)    BUN 30 (*)    Creatinine, Ser 1.29 (*)    Albumin 3.3 (*)    GFR calc non Af Amer 35 (*)    GFR calc Af Amer 40 (*)    All other components within normal limits  CBC WITH DIFFERENTIAL/PLATELET - Abnormal; Notable for the following components:   MCV 100.9 (*)    Platelets 131 (*)    Neutro Abs 8.4 (*)    Lymphs Abs 0.6 (*)    All other components within normal limits  URINALYSIS, ROUTINE W REFLEX MICROSCOPIC - Abnormal; Notable for the following components:   Glucose, UA >=500 (*)    Hgb urine dipstick SMALL (*)    Leukocytes, UA TRACE (*)    Bacteria, UA RARE (*)    All other components within normal limits  I-STAT TROPONIN, ED - Abnormal; Notable for the following components:   Troponin i, poc 1.55 (*)    All other components within normal limits  POC OCCULT BLOOD, ED - Abnormal; Notable for the following components:   Fecal Occult Bld POSITIVE (*)    All other components within normal limits  CULTURE, BLOOD (ROUTINE X 2)  CULTURE, BLOOD (ROUTINE X 2)  URINE CULTURE  LIPASE, BLOOD  TROPONIN I  TROPONIN I  TROPONIN I  BASIC METABOLIC PANEL  CBC  I-STAT CG4 LACTIC ACID, ED  I-STAT CG4 LACTIC ACID, ED    EKG EKG Interpretation  Date/Time:  Wednesday Nov 13 2017 18:27:17 EDT Ventricular Rate:  93 PR Interval:    QRS Duration: 95 QT Interval:  370 QTC Calculation: 458 R Axis:   1 Text Interpretation:  Sinus rhythm Low voltage, extremity and precordial leads Nonspecific T abnormalities, lateral leads No significant change since last tracing Confirmed by Orlie Dakin [phone]) on 11/13/2017 8:00:46 PM   Radiology Dg Chest 2 View  Result Date: 11/13/2017 CLINICAL DATA:  Epigastric pain and nausea EXAM: CHEST - 2 VIEW COMPARISON:  Chest radiograph 04/09/2017. FINDINGS: Monitoring leads overlie the patient. Stable cardiac and mediastinal contours. Aortic atherosclerosis. Small left pleural effusion. Bibasilar  heterogeneous opacities. No pneumothorax.  Thoracic spine degenerative changes. IMPRESSION: Small left pleural effusion and left basilar opacities favored to represent atelectasis. Heterogeneous opacities right lung base may represent atelectasis or infection. Electronically Signed   By: Lovey Newcomer M.D.   On: 11/13/2017 20:11    Procedures .Critical Care Performed by: Kinnie Feil, PA-C Authorized by: Kinnie Feil, PA-C   Critical care provider statement:    Critical care time (minutes):  30   Critical care was necessary to treat or prevent imminent or life-threatening deterioration of the following conditions: NSTEMI.   Critical care was time spent personally by me on the following activities:  Development of treatment plan with patient or surrogate, discussions with consultants, examination of patient, obtaining history from patient or surrogate, review of old charts, ordering and review of radiographic studies, ordering and review of laboratory studies and ordering and performing treatments and interventions   I assumed direction of critical care for this patient from another provider in my specialty: no     (including critical care time)  Medications Ordered in ED Medications - No data to display   Initial Impression / Assessment and Plan / ED Course  I have reviewed the triage vital signs and the nursing notes.  Pertinent labs & imaging results that were available during my care of the patient were reviewed by me and considered in my medical decision making (see chart for details).  Clinical Course as of Nov 14 2231  Wed Nov 13, 2017  1903 I spoke to on-call RN at [facility], Darrelyn Hillock who is familiar with patient.  States patient was in clinic today and complained of nausea, vomited x1, epigastric discomfort.  Patient has not had any oral food or fluids.  She was given Phenergan which did not help with nausea.  RN is unsure but thinks urinalysis showed UTI.  Patient was noted to have SPO2 87 to 92% on  room air.  She does not typically wear oxygen at home.  No known fevers.   [CG]  2009 Troponin i, poc(!!): 1.55 [CG]  2009 Creatinine(!): 1.29 [CG]  2009 Fecal Occult Blood, POC(!): POSITIVE [CG]  2043 Hgb urine dipstick(!): SMALL [CG]  2043 Leukocytes, UA(!): TRACE [CG]  2043 WBC, UA: 6-10 [CG]  2043 RBC / HPF: 0-5 [CG]  2043 Bacteria, UA(!): RARE [CG]  2043 IMPRESSION: Small left pleural effusion and left basilar opacities favored to represent atelectasis.  Heterogeneous opacities right lung base may represent atelectasis or infection.    DG Chest 2 View [CG]  2215 Spoke to cardiology, who suspects demand ischemia possibly from GI bleed. Patient not a candidate for heparin, aspirin, cath due to age and positive hemocult. Recommending medicine admission and work up for other demand ischemia, GI bleed and serial troponins.  Discussed possibility of PE given h/o DVT and CXR leading to elevated troponin, cardiology has low suspicion for this given VS   [CG]    Clinical Course User Index [CG] Kinnie Feil, PA-C   H/o MI 2015, DVT, GERD, dementia. No anticoagulants.   On exam she is HD stable. No rectal fever. No tachycardia however metoprolol on board. Mild generalized abd tenderness. History and exam not very reliable given baseline dementia. Reportedly SpO2 87-92% en route. No oxygen use at home. No hypotxia or tachypnea here in ED. Will obtain screening labs, urine, reassess.    Labs, urine, imaging reviewed.  Remarkable for troponin 1.55, creatinine 1.29, positive Hemoccult, possible right lung base  atelectasis versus infection.  Urinalysis not very convincing of infection. No EKG changes. CXR shows RLL atelectasis vs infection. PE could present similarly. She has h/o DVT 2017 however no hypoxia, tachypnea, tachycardia.  No reported fevers, cough.  In setting of dementia hx is unreliable. Suspicion is highest for ACS.  Will consult IM for admission for serial trops.   2200:  Spoke to IM recommending cardiology consult. Pending.   2030: Spoke to cardiology. See above.  Final Clinical Impressions(s) / ED Diagnoses   Final diagnoses:  NSTEMI (non-ST elevated myocardial infarction) [facility])    ED Discharge Orders    None         Arlean Hopping 11/13/17 2233    Orlie Dakin, MD 11/13/17 [phone]

## 2017-11-14 ENCOUNTER — Other Ambulatory Visit: Payer: Self-pay

## 2017-11-14 ENCOUNTER — Inpatient Hospital Stay (HOSPITAL_COMMUNITY): Payer: Medicare (Managed Care)

## 2017-11-14 DIAGNOSIS — N183 Chronic kidney disease, stage 3 (moderate): Secondary | ICD-10-CM

## 2017-11-14 DIAGNOSIS — M1990 Unspecified osteoarthritis, unspecified site: Secondary | ICD-10-CM

## 2017-11-14 DIAGNOSIS — Z79899 Other long term (current) drug therapy: Secondary | ICD-10-CM

## 2017-11-14 DIAGNOSIS — I129 Hypertensive chronic kidney disease with stage 1 through stage 4 chronic kidney disease, or unspecified chronic kidney disease: Secondary | ICD-10-CM

## 2017-11-14 DIAGNOSIS — F0390 Unspecified dementia without behavioral disturbance: Secondary | ICD-10-CM

## 2017-11-14 DIAGNOSIS — R112 Nausea with vomiting, unspecified: Secondary | ICD-10-CM

## 2017-11-14 DIAGNOSIS — Z87891 Personal history of nicotine dependence: Secondary | ICD-10-CM

## 2017-11-14 DIAGNOSIS — H3530 Unspecified macular degeneration: Secondary | ICD-10-CM

## 2017-11-14 DIAGNOSIS — I2510 Atherosclerotic heart disease of native coronary artery without angina pectoris: Secondary | ICD-10-CM

## 2017-11-14 DIAGNOSIS — Z7982 Long term (current) use of aspirin: Secondary | ICD-10-CM

## 2017-11-14 DIAGNOSIS — Z833 Family history of diabetes mellitus: Secondary | ICD-10-CM

## 2017-11-14 DIAGNOSIS — E1122 Type 2 diabetes mellitus with diabetic chronic kidney disease: Secondary | ICD-10-CM

## 2017-11-14 DIAGNOSIS — Z955 Presence of coronary angioplasty implant and graft: Secondary | ICD-10-CM

## 2017-11-14 DIAGNOSIS — I340 Nonrheumatic mitral (valve) insufficiency: Secondary | ICD-10-CM

## 2017-11-14 DIAGNOSIS — Z888 Allergy status to other drugs, medicaments and biological substances status: Secondary | ICD-10-CM

## 2017-11-14 DIAGNOSIS — R109 Unspecified abdominal pain: Secondary | ICD-10-CM

## 2017-11-14 DIAGNOSIS — J9811 Atelectasis: Secondary | ICD-10-CM

## 2017-11-14 DIAGNOSIS — Z7901 Long term (current) use of anticoagulants: Secondary | ICD-10-CM

## 2017-11-14 DIAGNOSIS — H9190 Unspecified hearing loss, unspecified ear: Secondary | ICD-10-CM

## 2017-11-14 DIAGNOSIS — Z7902 Long term (current) use of antithrombotics/antiplatelets: Secondary | ICD-10-CM

## 2017-11-14 DIAGNOSIS — Z8249 Family history of ischemic heart disease and other diseases of the circulatory system: Secondary | ICD-10-CM

## 2017-11-14 DIAGNOSIS — E1139 Type 2 diabetes mellitus with other diabetic ophthalmic complication: Secondary | ICD-10-CM

## 2017-11-14 DIAGNOSIS — I252 Old myocardial infarction: Secondary | ICD-10-CM

## 2017-11-14 DIAGNOSIS — Z86718 Personal history of other venous thrombosis and embolism: Secondary | ICD-10-CM

## 2017-11-14 DIAGNOSIS — K219 Gastro-esophageal reflux disease without esophagitis: Secondary | ICD-10-CM

## 2017-11-14 DIAGNOSIS — I214 Non-ST elevation (NSTEMI) myocardial infarction: Principal | ICD-10-CM

## 2017-11-14 DIAGNOSIS — I2583 Coronary atherosclerosis due to lipid rich plaque: Secondary | ICD-10-CM

## 2017-11-14 DIAGNOSIS — I420 Dilated cardiomyopathy: Secondary | ICD-10-CM

## 2017-11-14 DIAGNOSIS — E7800 Pure hypercholesterolemia, unspecified: Secondary | ICD-10-CM

## 2017-11-14 DIAGNOSIS — Z7984 Long term (current) use of oral hypoglycemic drugs: Secondary | ICD-10-CM

## 2017-11-14 LAB — BLOOD CULTURE ID PANEL (REFLEXED)
Acinetobacter baumannii: NOT DETECTED
CANDIDA GLABRATA: NOT DETECTED
CANDIDA KRUSEI: NOT DETECTED
CANDIDA PARAPSILOSIS: NOT DETECTED
Candida albicans: NOT DETECTED
Candida tropicalis: NOT DETECTED
ENTEROBACTER CLOACAE COMPLEX: NOT DETECTED
ENTEROCOCCUS SPECIES: NOT DETECTED
ESCHERICHIA COLI: NOT DETECTED
Enterobacteriaceae species: NOT DETECTED
Haemophilus influenzae: NOT DETECTED
KLEBSIELLA OXYTOCA: NOT DETECTED
Klebsiella pneumoniae: NOT DETECTED
LISTERIA MONOCYTOGENES: NOT DETECTED
Methicillin resistance: NOT DETECTED
Neisseria meningitidis: NOT DETECTED
PROTEUS SPECIES: NOT DETECTED
PSEUDOMONAS AERUGINOSA: NOT DETECTED
SERRATIA MARCESCENS: NOT DETECTED
STREPTOCOCCUS AGALACTIAE: NOT DETECTED
STREPTOCOCCUS PNEUMONIAE: NOT DETECTED
Staphylococcus aureus (BCID): NOT DETECTED
Staphylococcus species: DETECTED — AB
Streptococcus pyogenes: NOT DETECTED
Streptococcus species: NOT DETECTED

## 2017-11-14 LAB — CBC
HCT: 38.8 % (ref 36.0–46.0)
Hemoglobin: 11.9 g/dL — ABNORMAL LOW (ref 12.0–15.0)
MCH: 30.4 pg (ref 26.0–34.0)
MCHC: 30.7 g/dL (ref 30.0–36.0)
MCV: 99.2 fL (ref 78.0–100.0)
PLATELETS: 140 10*3/uL — AB (ref 150–400)
RBC: 3.91 MIL/uL (ref 3.87–5.11)
RDW: 14.3 % (ref 11.5–15.5)
WBC: 8.1 10*3/uL (ref 4.0–10.5)

## 2017-11-14 LAB — BASIC METABOLIC PANEL
Anion gap: 7 (ref 5–15)
BUN: 26 mg/dL — AB (ref 6–20)
CHLORIDE: 101 mmol/L (ref 101–111)
CO2: 33 mmol/L — ABNORMAL HIGH (ref 22–32)
Calcium: 9.1 mg/dL (ref 8.9–10.3)
Creatinine, Ser: 1.18 mg/dL — ABNORMAL HIGH (ref 0.44–1.00)
GFR calc Af Amer: 45 mL/min — ABNORMAL LOW (ref >60)
GFR calc non Af Amer: 39 mL/min — ABNORMAL LOW (ref >60)
GLUCOSE: 115 mg/dL — AB (ref 65–99)
Potassium: 4.2 mmol/L (ref 3.5–5.1)
SODIUM: 141 mmol/L (ref 135–145)

## 2017-11-14 LAB — TROPONIN I
TROPONIN I: 5.31 ng/mL — AB (ref <0.03)
Troponin I: 15.06 ng/mL (ref <0.03)

## 2017-11-14 LAB — CBG MONITORING, ED
Glucose-Capillary: 87 mg/dL (ref 65–99)
Glucose-Capillary: 135 mg/dL — ABNORMAL HIGH (ref 65–99)

## 2017-11-14 LAB — ECHOCARDIOGRAM COMPLETE
Height: 61 in
Weight: 2160 oz

## 2017-11-14 LAB — APTT: APTT: 44 s — AB (ref 24–36)

## 2017-11-14 LAB — HEPARIN LEVEL (UNFRACTIONATED): Heparin Unfractionated: 0.13 IU/mL — ABNORMAL LOW (ref 0.30–0.70)

## 2017-11-14 LAB — GLUCOSE, CAPILLARY: GLUCOSE-CAPILLARY: 152 mg/dL — AB (ref 65–99)

## 2017-11-14 MED ORDER — ASPIRIN EC 81 MG PO TBEC
81.0000 mg | DELAYED_RELEASE_TABLET | Freq: Every day | ORAL | Status: DC
  Administered 2017-11-14 – 2017-11-17 (×4): 81 mg via ORAL
  Filled 2017-11-14 (×4): qty 1

## 2017-11-14 MED ORDER — HEPARIN (PORCINE) IN NACL 100-0.45 UNIT/ML-% IJ SOLN
900.0000 [IU]/h | INTRAMUSCULAR | Status: DC
  Administered 2017-11-14: 700 [IU]/h via INTRAVENOUS
  Filled 2017-11-14: qty 250

## 2017-11-14 MED ORDER — HEPARIN BOLUS VIA INFUSION
1500.0000 [IU] | Freq: Once | INTRAVENOUS | Status: DC
  Administered 2017-11-14: 1500 [IU] via INTRAVENOUS
  Filled 2017-11-14: qty 1500

## 2017-11-14 MED ORDER — METOPROLOL TARTRATE 12.5 MG HALF TABLET
12.5000 mg | ORAL_TABLET | Freq: Once | ORAL | Status: AC
  Administered 2017-11-15: 12.5 mg via ORAL
  Filled 2017-11-14: qty 1

## 2017-11-14 MED ORDER — METOPROLOL SUCCINATE ER 25 MG PO TB24
12.5000 mg | ORAL_TABLET | Freq: Every day | ORAL | Status: DC
  Administered 2017-11-15 – 2017-11-17 (×3): 12.5 mg via ORAL
  Filled 2017-11-14 (×4): qty 1

## 2017-11-14 NOTE — Progress Notes (Signed)
   Subjective: [name] was seen resting in her bed this morning doing well. She stated that she did not have any chest pain, abdominal pain, or shortness of breath currently. She was pleasantly demented.   Objective:  Vital signs in last 24 hours: Vitals:   11/14/17 0200 11/14/17 0300 11/14/17 0400 11/14/17 0500  BP: 135/73 (!) 126/58 127/63 (!) 104/53  Pulse: 65 71 85 68  Resp: 18 (!) 25 (!) 24 (!) 23  Temp:      TempSrc:      SpO2: 100% 99% 99% 98%  Weight:      Height:       Physical Exam  Constitutional: She appears well-developed and well-nourished. No distress.  Pleasantly demented  HENT:  Head: Normocephalic and atraumatic.  Eyes: Conjunctivae are normal.  Cardiovascular: Normal rate, regular rhythm and normal heart sounds.  Respiratory: Effort normal and breath sounds normal. No respiratory distress. She has no wheezes.  GI: Soft. Bowel sounds are normal. She exhibits no distension. There is no tenderness.  Musculoskeletal: She exhibits no edema.  Neurological: She is alert.  Skin: She is not diaphoretic.   Assessment/Plan:  [name] is a 84 y.o f with CAD, CKD III, DM, HTN, OA who presented with abdominal pain and nausea/vomiting. Troponin elevated and peaked to 15 and ekg showed some t wave inversions. Started on heparin drip and aspirin started. Cardiology consulted.   NSTEMI  The patient has a strong history of coronary artery disease with a RCA stent placement in 1996, occluded distal LAD and OM with PCA placed. Cardiology consulted   The patient has a HEART score of 8 which places her at a high risk of 50-65% of a major adverse cardiac event. Given the patient's age it is less beneficial that she get a catheterization for possible intervention.   Echo was done today 11/14/17 which showed a decreased lvef of 45-50%, g1dd, hypokinesis in th anterolateral and inferolateral myocardium, mild concentric left ventricular hypertrophy, normal sized left atrium and cvp of  33mmhg.  The patient is currently being medically managed with heparin drip, aspirin, and cholesterol.   -Continue heparin drip -Aspirin 81mg   -Continuing home metoprolol 12.5mg  qd -continue cardiac monitoring  Dementia   Diabetes Mellitus  The patient's last A1C is not able to be seen in our EMR. She is currently on glipizide 5mg  qd.   -SSI sensitive  CKD III The patient's creatinine today was 1.18 which is down from 1.29 on admission.   -Avoid nephrotoxic medications  Dispo: Anticipated discharge in approximately 1-2 day(s).   Lars Mage, MD 11/14/2017, 6:45 AM Pager: [phone]

## 2017-11-14 NOTE — Progress Notes (Signed)
ANTICOAGULATION CONSULT NOTE - Initial Consult  Pharmacy Consult for heparin Indication: chest pain/ACS  Allergies  Allergen Reactions  . Fluticasone-Salmeterol Other (See Comments)    Unknown reaction  . Statins Other (See Comments)    Per [facility]    Patient Measurements: Height: 5\' 1"  (154.9 cm) Weight: 135 lb (61.2 kg) IBW/kg (Calculated) : 47.8 Heparin Dosing Weight: 60.2 kg   Vital Signs: BP: 124/52 (05/30 1300) Pulse Rate: 73 (05/30 1345)  Labs: Recent Labs    11/13/17 1813 11/13/17 2330 11/14/17 0414 11/14/17 1248 11/14/17 1249  HGB 13.1  --  11.9*  --   --   HCT 42.8  --  38.8  --   --   PLT 131*  --  140*  --   --   APTT  --   --   --  44*  --   HEPARINUNFRC  --   --   --   --  0.13*  CREATININE 1.29*  --  1.18*  --   --   TROPONINI  --  5.31* 15.06*  --   --     Estimated Creatinine Clearance: 25 mL/min (A) (by C-G formula based on SCr of 1.18 mg/dL (H)).   Medical History: Past Medical History:  Diagnosis Date  . Anemia    a. Mild, noted on bloodwork 12/2013.  Marland Kitchen Arthritis    "all over" (02/09/2013)  . Asthma   . Blood transfusion reaction   . CAD (coronary artery disease)    a. prior hx of RCA stent in 1996. b. 12/2013: inferior STEMI s/p extensive PTCA only of occluded RCA (no stents); distal LAD and OM were chronically occluded, EF 55-60%.  . Cardiogenic shock ([place])    a. 12/2013: during STEMI with SBP 60s, AV block/bradycardia, resolved with reperfusion.  . CKD (chronic kidney disease), stage III ([place])    a. "one kidney rotted" (02/09/2013). (?) b. likely stage III by historical bloodwork.  . Depression   . Falls frequently   . GERD (gastroesophageal reflux disease)   . Glaucoma, right eye   . Hernia   . HTN (hypertension)   . Hyperlipidemia   . Mild dementia   . Seasonal allergies   . Skin cancer of nose    "left side" (02/09/2013)  . Stroke [facility]) 1960's   "a light one" (02/09/2013)  . Thrombocytopenia ([place])    a. Mild, noted on bloodwork  12/2013.  . Type II diabetes mellitus ([place])   . Vertigo      Assessment: 89yoF with history of CAD s/p RCA stent 1996 and STEMI in 2015 with PCA but no stent. Cath in 2015 showed chronically occluded distal LAD and OM with EF 55-60%, per 2016 cardiology note.  Pharmacy consulted to start heparin. Troponins elevated.  new t-wave inversions. Hgb wnl, plt 131. Patient received heparin 5000 units subQ @ 0136. Patient has had apixaban in the past but unsure when therapy ended.  Of note, pt was heme occult positive in the ED  Heparin level 0.13  Goal of Therapy:  Heparin level 0.3-0.7 units/ml Monitor platelets by anticoagulation protocol: Yes   Plan:  Heparin 1500 unit bolus Increase heparin infusion to 900 units/hr Check anti-Xa level in 6 hours and daily while on heparin Continue to monitor H&H and platelets   Thank you for allowing [place] to participate in this patients care. Laqueta Linden, PharmD 11/14/17

## 2017-11-14 NOTE — ED Notes (Signed)
Admitting at bedside 

## 2017-11-14 NOTE — ED Notes (Signed)
Echo in progress.

## 2017-11-14 NOTE — Progress Notes (Addendum)
Patient Troponin trended up to 5.31. Repeat EKG ordered and reviewed. Possible t-wave inversions in V5, V6, but no significant ischemic changed. Patient remains asymptomatic and was informed that we had contacted her daughter to update her. Discuss starting heparin including risk of bleeding with patient, she would like to proceed.  - Continue to monitor and trend troponin - Start therapeutic heparin  Pearson Grippe, DO IM PGY-1

## 2017-11-14 NOTE — Progress Notes (Signed)
  Echocardiogram 2D Echocardiogram has been performed.  [name] 11/14/2017, 10:46 AM

## 2017-11-14 NOTE — Progress Notes (Signed)
Pt arrived from ED approx 3pm.  Pt noted soiled in urine.  R IV site bleeding with catheter out and heparin drip running onto sheets.  Pt bathed, v/s taken, oriented to room.  New IV started LFA w/o difficulty.

## 2017-11-14 NOTE — Progress Notes (Signed)
ANTICOAGULATION CONSULT NOTE - Initial Consult  Pharmacy Consult for heparin Indication: chest pain/ACS  Allergies  Allergen Reactions  . Fluticasone-Salmeterol Other (See Comments)    Unknown reaction  . Statins Other (See Comments)    Per [facility]    Patient Measurements: Height: 5\' 1"  (154.9 cm) Weight: 135 lb (61.2 kg) IBW/kg (Calculated) : 47.8 Heparin Dosing Weight: 60.2 kg   Vital Signs: Temp: 97.7 F (36.5 C) (05/29 1800) Temp Source: Rectal (05/29 1800) BP: 126/64 (05/30 0100) Pulse Rate: 93 (05/30 0100)  Labs: Recent Labs    11/13/17 1813 11/13/17 2330  HGB 13.1  --   HCT 42.8  --   PLT 131*  --   CREATININE 1.29*  --   TROPONINI  --  5.31*    Estimated Creatinine Clearance: 22.9 mL/min (A) (by C-G formula based on SCr of 1.29 mg/dL (H)).   Medical History: Past Medical History:  Diagnosis Date  . Anemia    a. Mild, noted on bloodwork 12/2013.  Marland Kitchen Arthritis    "all over" (02/09/2013)  . Asthma   . Blood transfusion reaction   . CAD (coronary artery disease)    a. prior hx of RCA stent in 1996. b. 12/2013: inferior STEMI s/p extensive PTCA only of occluded RCA (no stents); distal LAD and OM were chronically occluded, EF 55-60%.  . Cardiogenic shock ([place])    a. 12/2013: during STEMI with SBP 60s, AV block/bradycardia, resolved with reperfusion.  . CKD (chronic kidney disease), stage III ([place])    a. "one kidney rotted" (02/09/2013). (?) b. likely stage III by historical bloodwork.  . Depression   . Falls frequently   . GERD (gastroesophageal reflux disease)   . Glaucoma, right eye   . Hernia   . HTN (hypertension)   . Hyperlipidemia   . Mild dementia   . Seasonal allergies   . Skin cancer of nose    "left side" (02/09/2013)  . Stroke [facility]) 1960's   "a light one" (02/09/2013)  . Thrombocytopenia ([place])    a. Mild, noted on bloodwork 12/2013.  . Type II diabetes mellitus ([place])   . Vertigo      Assessment: [name] with history of CAD s/p RCA stent 1996  and STEMI in 2015 with PCA but no stent. Cath in 2015 showed chronically occluded distal LAD and OM with EF 55-60%, per 2016 cardiology note.  Pharmacy consulted to start heparin. Troponins elevated.  new t-wave inversions. Hgb wnl, plt 131. Patient received heparin 5000 units subQ @ 0136. Patient has had apixaban in the past but unsure when therapy ended.  Of note, pt was heme occult positive in the ED  Goal of Therapy:  Heparin level 0.3-0.7 units/ml Monitor platelets by anticoagulation protocol: Yes   Plan:  No bolus Start heparin infusion at 700 units/hr Check anti-Xa level in 8 hours and daily while on heparin Continue to monitor H&H and platelets   Thank you for allowing [place] to participate in this patients care. Jens Som, PharmD

## 2017-11-14 NOTE — Plan of Care (Signed)
  Problem: Elimination: Goal: Will not experience complications related to bowel motility Outcome: Progressing   Problem: Safety: Goal: Ability to remain free from injury will improve Outcome: Progressing   

## 2017-11-14 NOTE — Progress Notes (Signed)
[place] for heparin Indication: chest pain/ACS  Allergies  Allergen Reactions  . Fluticasone-Salmeterol Other (See Comments)    Unknown reaction  . Statins Other (See Comments)    Per [facility]    Patient Measurements: Height: 5' (152.4 cm) Weight: 128 lb 12 oz (58.4 kg) IBW/kg (Calculated) : 45.5 Heparin Dosing Weight: 60.2 kg   Vital Signs: Temp: 98 F (36.7 C) (05/30 1552) Temp Source: Oral (05/30 1551) BP: 108/57 (05/30 1526) Pulse Rate: 78 (05/30 1526)  Labs: Recent Labs    11/13/17 1813 11/13/17 2330 11/14/17 0414 11/14/17 1248 11/14/17 1249  HGB 13.1  --  11.9*  --   --   HCT 42.8  --  38.8  --   --   PLT 131*  --  140*  --   --   APTT  --   --   --  44*  --   HEPARINUNFRC  --   --   --   --  0.13*  CREATININE 1.29*  --  1.18*  --   --   TROPONINI  --  5.31* 15.06*  --   --     Estimated Creatinine Clearance: 23.8 mL/min (A) (by C-G formula based on SCr of 1.18 mg/dL (H)).   Medical History: Past Medical History:  Diagnosis Date  . Anemia    a. Mild, noted on bloodwork 12/2013.  Marland Kitchen Arthritis    "all over" (02/09/2013)  . Asthma   . Blood transfusion reaction   . CAD (coronary artery disease)    a. prior hx of RCA stent in 1996. b. 12/2013: inferior STEMI s/p extensive PTCA only of occluded RCA (no stents); distal LAD and OM were chronically occluded, EF 55-60%.  . Cardiogenic shock ([place])    a. 12/2013: during STEMI with SBP 60s, AV block/bradycardia, resolved with reperfusion.  . CKD (chronic kidney disease), stage III ([place])    a. "one kidney rotted" (02/09/2013). (?) b. likely stage III by historical bloodwork.  . Depression   . Falls frequently   . GERD (gastroesophageal reflux disease)   . Glaucoma, right eye   . Hernia   . HTN (hypertension)   . Hyperlipidemia   . Mild dementia   . Seasonal allergies   . Skin cancer of nose    "left side" (02/09/2013)  . Stroke [facility]) 1960's   "a light one" (02/09/2013)  .  Thrombocytopenia ([place])    a. Mild, noted on bloodwork 12/2013.  . Type II diabetes mellitus ([place])   . Vertigo      Assessment: 89yoF with history of CAD s/p RCA stent 1996 and STEMI in 2015 with PCA but no stent. Cath in 2015 showed chronically occluded distal LAD and OM with EF 55-60%, per 2016 cardiology note.  Pharmacy consulted to start heparin- now consult changed to VTE prophylaxis dosing per cardiology. Hgb 11.9, plt 140. Patient has had apixaban in the past but unsure when therapy ended.  Of note, pt was heme occult positive in the ED.   Goal of Therapy:  Monitor platelets by anticoagulation protocol: Yes   Plan:  Start heparin 5000 units subQ every 8 hours tonight at 2200 Continue to monitor H&H and platelets  Thank you for allowing [place] to participate in this patients care.  Doylene Canard, PharmD Clinical Pharmacist  Pager: [phone] Phone: [phone] 11/14/17

## 2017-11-14 NOTE — ED Notes (Addendum)
POA (Daughter) [phone] Izora Gala [address]

## 2017-11-14 NOTE — Progress Notes (Signed)
PHARMACY - PHYSICIAN COMMUNICATION CRITICAL VALUE ALERT - BLOOD CULTURE IDENTIFICATION (BCID)  Results for orders placed or performed during the hospital encounter of 11/13/17  Blood Culture ID Panel (Reflexed) (Collected: 11/13/2017  6:39 PM)  Result Value Ref Range   Enterococcus species NOT DETECTED NOT DETECTED   Listeria monocytogenes NOT DETECTED NOT DETECTED   Staphylococcus species DETECTED (A) NOT DETECTED   Staphylococcus aureus NOT DETECTED NOT DETECTED   Methicillin resistance NOT DETECTED NOT DETECTED   Streptococcus species NOT DETECTED NOT DETECTED   Streptococcus agalactiae NOT DETECTED NOT DETECTED   Streptococcus pneumoniae NOT DETECTED NOT DETECTED   Streptococcus pyogenes NOT DETECTED NOT DETECTED   Acinetobacter baumannii NOT DETECTED NOT DETECTED   Enterobacteriaceae species NOT DETECTED NOT DETECTED   Enterobacter cloacae complex NOT DETECTED NOT DETECTED   Escherichia coli NOT DETECTED NOT DETECTED   Klebsiella oxytoca NOT DETECTED NOT DETECTED   Klebsiella pneumoniae NOT DETECTED NOT DETECTED   Proteus species NOT DETECTED NOT DETECTED   Serratia marcescens NOT DETECTED NOT DETECTED   Haemophilus influenzae NOT DETECTED NOT DETECTED   Neisseria meningitidis NOT DETECTED NOT DETECTED   Pseudomonas aeruginosa NOT DETECTED NOT DETECTED   Candida albicans NOT DETECTED NOT DETECTED   Candida glabrata NOT DETECTED NOT DETECTED   Candida krusei NOT DETECTED NOT DETECTED   Candida parapsilosis NOT DETECTED NOT DETECTED   Candida tropicalis NOT DETECTED NOT DETECTED    Name of physician (or Provider) Contacted: None  Changes to prescribed antibiotics required: No ABX needed WBC wnl, afebrile  Probable contaminant 1/4 BCX with CNS.  Bonnita Nasuti Pharm.D. CPP, BCPS Clinical Pharmacist [phone] 11/14/2017 7:42 PM

## 2017-11-14 NOTE — Consult Note (Addendum)
Cardiology Consultation:   Patient ID: [name]; [id number]; 29-Nov-1921   Admit date: 11/13/2017 Date of Consult: 11/14/2017  Primary Care Provider: Janifer Adie, MD Primary Cardiologist: Dr. Aundra Dubin remotely (has not f/u since 2016)   Chief Complaint:   - Per chart: abdominal pain with nausea/vomiting.   - Per patient: "I don't even want to talk or think about it."  Patient Profile:   [name] is a 84 y.o. female with a hx of CAD (MI with stent to RCA in 1996, inf STEMI s/p POBA to RCA 2015 with cardiogenic shock/bradycardia), DVT 2017, dementia, stroke CKD III, DM, asthma, allergies, glaucoma, GERD, frequent falls, HTN, HL, COPD who is being seen today for the evaluation of NSTEMI (troponin of 15) at the request of Dr. Evette Doffing.  History of Present Illness:   A good portion of history is obtained from the chart as the patient is a poor historian. She has remote hx of MI 1996 s/p RCA stent. She was admitted 12/2013 with acute inferolateral STEMI c/b cardiogenic shock and HR in the 40s. Emergent cath demonstrated 3v CAD with CTO of the dist LAD and OM1, D1 80% and acute occlusion of the RCA. RCA was treated with POBA with resolution of shock and improved HR. Echo demonstrated EF 55-60%. It was recommended she remain on Brilinta x 30 days, then transition to Plavix long-term. She was last seen in our office in 2016 at which time statin was stopped as it was felt to be contributing to some gait instability. There were prior concerns over the patient's ability to care for herself. At some point in 2017 she had a DVT per IM admission note and is on Plavix and Eliquis but it's not clear if she is on this now. I spoke with her daughter Izora Gala by phone who states she has primarily now been followed by primary care at [facility].  Izora Gala states for months she has been trying to ignore the signs of dementia but it has gotten painfully worse the last 6 months. In previous stints in SNF  facilities, the patient would engage in walker fights with other residents or exhibit agitation/threatening behavior towards nursing staff when becoming anxious. She is blind and does not hear well which makes the situation worse. She has 2 aides that rotate coming over for a few hours a day and the patient has begun to accuse them of stealing things that are later found in unusual places. Izora Gala has found garlic bulbs in the patient's purse. She also more recently has been stuffing things of personal value to her in 4 separate purses and sleeping with them at night and becoming very agitated if anyone tries to touch them. She falls frequently and hasn't felt good in weeks. Izora Gala hasn't heard her complain of any chest pain. She does vomit after most meals but doesn't chew her food well and did not want to wear dentures.   Notes indicate the patient was at [facility] where she was being evaluated for abdominal pain. Patient had reportedly been experiencing abdominal pain since the morning and had begun to vomit. She did not respond to Zofran or phenergan initially and EMS was called to bring the patient to the ED. Workup has revealed elevation in troponins 1.55->5.31->15.06, with positive FOBT and Hgb from 13.1-11.9 with Cr 1.29-1.18. She has been intermittently hypoxicas well. The patient is unable to really offer any kind of reliable history. She does report she's had some chest  pain in the past but becomes agitated with further questions, telling me she doesn't want to talk or think about it any further, and has "no interest in getting caught up with all of that." She adamantly denies any acute chest pain and states she's had a good day except wants to sleep. No edema, fevers, abdominal pain, current nausea or vomiting. CXR small left pleural effusion and left basilar opacities favored to represent atelectasis, heterogeneous opacities right lung base may represent atelectasis or infection. 2D echo today showed EF  45-50% with hypokinesis of anterolateral and inferolateral myocardium, grade 1 DD, small pericardial effusion, mild MR.   Past Medical History:  Diagnosis Date  . Anemia    a. Mild, noted on bloodwork 12/2013.  Marland Kitchen Arthritis    "all over" (02/09/2013)  . Asthma   . Blood transfusion reaction   . CAD (coronary artery disease)    a. prior hx of RCA stent in 1996. b. 12/2013: inferior STEMI s/p extensive PTCA only of occluded RCA (no stents); distal LAD and OM were chronically occluded, EF 55-60%.  . Cardiogenic shock ([place])    a. 12/2013: during STEMI with SBP 60s, AV block/bradycardia, resolved with reperfusion.  . CKD (chronic kidney disease), stage III ([place])    a. "one kidney rotted" (02/09/2013). (?) b. likely stage III by historical bloodwork.  . Depression   . Falls frequently   . GERD (gastroesophageal reflux disease)   . Glaucoma, right eye   . Hernia   . HTN (hypertension)   . Hyperlipidemia   . Mild dementia   . Seasonal allergies   . Skin cancer of nose    "left side" (02/09/2013)  . Stroke [facility]) 1960's   "a light one" (02/09/2013)  . Thrombocytopenia ([place])    a. Mild, noted on bloodwork 12/2013.  . Type II diabetes mellitus ([place])   . Vertigo     Past Surgical History:  Procedure Laterality Date  . APPENDECTOMY    . CARDIAC CATHETERIZATION  2005   Archie Endo 05/07/2006 (02/09/2013)  . CHOLECYSTECTOMY  02/2005   Archie Endo 02/28/2005 (02/09/2013)  . CORONARY ANGIOPLASTY WITH STENT PLACEMENT    . EYE SURGERY Left   . LEFT HEART CATHETERIZATION WITH CORONARY ANGIOGRAM Bilateral 01/15/2014   Procedure: LEFT HEART CATHETERIZATION WITH CORONARY ANGIOGRAM;  Surgeon: Peter M Martinique, MD;  Location[facility] CATH LAB;  Service: Cardiovascular;  Laterality: Bilateral;  . NEPHRECTOMY Left    Archie Endo 02/27/2005 (02/09/2013)  . TUBAL LIGATION    . UMBILICAL HERNIA REPAIR    . VITRECTOMY Right    Archie Endo 01/19/2002  (02/09/2013)     Inpatient Medications: Scheduled Meds: . aspirin EC  81 mg Oral Daily    . feeding supplement (GLUCERNA SHAKE)  237 mL Oral Daily  . heparin  1,500 Units Intravenous Once  . insulin aspart  0-9 Units Subcutaneous TID WC  . metoprolol succinate  12.5 mg Oral Daily  . sertraline  100 mg Oral Daily   Continuous Infusions: . heparin 700 Units/hr (11/14/17 0421)   PRN Meds: acetaminophen **OR** acetaminophen, ondansetron **OR** ondansetron (ZOFRAN) IV, polyvinyl alcohol, senna-docusate  Home Meds: Prior to Admission medications   Medication Sig Start Date End Date Taking? Authorizing Provider  acetaminophen (TYLENOL) 650 MG CR tablet Take 650 mg by mouth daily.   Yes [provider]  acetaminophen (TYLENOL) 650 MG CR tablet Take 650 mg by mouth at bedtime as needed for pain.   Yes [provider]  Dextran 70-Hypromellose (NATURES TEARS) 0.1-0.3 %  SOLN Place 1 drop into both eyes 2 (two) times daily as needed (for dry eyes).   Yes [provider]  furosemide (LASIX) 20 MG tablet Take 10 mg by mouth daily.   Yes [provider]  glipiZIDE (GLUCOTROL XL) 5 MG 24 hr tablet Take 5 mg by mouth daily with breakfast.   Yes [provider]  GLUCERNA (GLUCERNA) LIQD Take 237 mLs by mouth daily.   Yes [provider]  guaiFENesin (ROBITUSSIN) 100 MG/5ML SOLN Take 10 mLs by mouth every 6 (six) hours as needed for cough or to loosen phlegm.   Yes [provider]  loperamide (IMODIUM A-D) 2 MG tablet Take 2 mg by mouth as needed for diarrhea or loose stools.   Yes [provider]  metoprolol succinate (TOPROL XL) 25 MG 24 hr tablet Take 1 tablet (25 mg total) by mouth daily. Patient taking differently: Take 12.5 mg by mouth daily.  04/01/14  Yes Weaver, Scott T, PA-C  nitroGLYCERIN (NITROSTAT) 0.4 MG SL tablet Place 1 tablet (0.4 mg total) under the tongue every 5 (five) minutes as needed for chest pain (up to 3 doses). 01/19/14  Yes Dunn, Dayna N, PA-C  ondansetron (ZOFRAN-ODT) 4 MG disintegrating tablet Take  4 mg by mouth as needed for nausea or vomiting.   Yes [provider]  pantoprazole (PROTONIX) 20 MG tablet Take 20 mg by mouth daily.   Yes [provider]  senna-docusate (SENNA S) 8.6-50 MG tablet Take 1-2 tablets by mouth See admin instructions. Take 2 tablets every morning then take 1 tablet every evening   Yes [provider]  sertraline (ZOLOFT) 100 MG tablet Take 100 mg by mouth daily.   Yes [provider]  Triamcinolone Acetonide (TRIAMCINOLONE 0.1 % CREAM : EUCERIN) CREA Apply 1 application topically 2 (two) times daily as needed for rash.   Yes [provider]  Vitamin D, Ergocalciferol, (DRISDOL) 50000 units CAPS capsule Take 50,000 Units by mouth every 30 (thirty) days.   Yes [provider]  apixaban (ELIQUIS) 5 MG TABS tablet Take 1 tablet (5 mg total) by mouth 2 (two) times daily. Patient not taking: Reported on 11/13/2017 11/10/15   Pixie Casino, MD  clopidogrel (PLAVIX) 75 MG tablet Take 1 tablet (75 mg total) by mouth daily. Patient not taking: Reported on 11/13/2017 01/29/14   Richardson Dopp T, PA-C  HYDROcodone-acetaminophen (NORCO/VICODIN) 5-325 MG per tablet Take 0.5-1 tablets by mouth every 6 (six) hours as needed for severe pain. Patient not taking: Reported on 10/25/2015 05/12/14   Sherwood Gambler, MD  pantoprazole (PROTONIX) 40 MG tablet Take 1 tablet (40 mg total) by mouth daily. Patient not taking: Reported on 10/25/2015 01/19/14   Charlie Pitter, PA-C    Allergies:    Allergies  Allergen Reactions  . Fluticasone-Salmeterol Other (See Comments)    Unknown reaction  . Statins Other (See Comments)    Per MAR    Social History:   Social History   Socioeconomic History  . Marital status: Widowed    Spouse name: Not on file  . Number of children: Not on file  . Years of education: Not on file  . Highest education level: Not on file  Occupational History  . Not on file  Social Needs  . Financial resource strain:  Not on file  . Food insecurity:    Worry: Not on file    Inability: Not on file  . Transportation needs:    Medical:  Not on file    Non-medical: Not on file  Tobacco Use  . Smoking status: Former Smoker    Packs/day: 3.00    Years: 32.00    Pack years: 96.00    Types: Cigarettes    Last attempt to quit: 06/18/1973    Years since quitting: 44.4  . Smokeless tobacco: Never Used  Substance and Sexual Activity  . Alcohol use: No    Comment: 02/09/2013 "haven't had a drink in 29 yr; used to have a beer once in awhile"  . Drug use: No  . Sexual activity: Never  Lifestyle  . Physical activity:    Days per week: Not on file    Minutes per session: Not on file  . Stress: Not on file  Relationships  . Social connections:    Talks on phone: Not on file    Gets together: Not on file    Attends religious service: Not on file    Active member of club or organization: Not on file    Attends meetings of clubs or organizations: Not on file    Relationship status: Not on file  . Intimate partner violence:    Fear of current or ex partner: Not on file    Emotionally abused: Not on file    Physically abused: Not on file    Forced sexual activity: Not on file  Other Topics Concern  . Not on file  Social History Narrative  . Not on file    Family History:   The patient's family history includes Cancer in her father and mother; Diabetes in her brother and son; Stroke in her daughter and son. There is no history of Heart attack.  ROS:  Please see the history of present illness.  all other ROS reviewed and negative.     Physical Exam/Data:   Vitals:   11/14/17 1400 11/14/17 1526 11/14/17 1551 11/14/17 1552  BP: (!) 147/65 (!) 108/57    Pulse: 81 78    Resp: (!) 21 12    Temp:   98 F (36.7 C) 98 F (36.7 C)  TempSrc:   Oral   SpO2: 96% (!) 86%    Weight:    128 lb 12 oz (58.4 kg)  Height:    5' (1.524 m)    Intake/Output Summary (Last 24 hours) at 11/14/2017 1623 Last data  filed at 11/13/2017 1741 Gross per 24 hour  Intake 500 ml  Output -  Net 500 ml   Filed Weights   11/13/17 1752 11/14/17 1552  Weight: 135 lb (61.2 kg) 128 lb 12 oz (58.4 kg)   Body mass index is 25.14 kg/m.  General: Frail elderly WF, in no acute distress. Head: Normocephalic, atraumatic, sclera non-icteric, no xanthomas, nares are without discharge. Neck: Negative for carotid bruits. JVD not elevated. Lungs: Clear bilaterally to auscultation without wheezes, rales, or rhonchi. Breathing is unlabored. Heart: RRR with S1 S2. No murmurs, rubs, or gallops appreciated. Abdomen: Soft, non-tender, non-distended with normoactive bowel sounds. No hepatomegaly. No rebound/guarding. No obvious abdominal masses. Msk:  Strength and tone appear normal for age. Extremities: No clubbing or cyanosis. No edema.  Distal pedal pulses are 2+ and equal bilaterally. Neuro: Alert and oriented to self, [facility]. Thinks it's May 1620. No facial asymmetry. No focal deficit. Moves all extremities spontaneously. Psych:  Responds to questions appropriately with a normal affect.  EKG:  The EKG was personally reviewed and demonstrates NSR with SA 73bpm low voltage QRS with prior anterior  infarct, TWI I, avL, V4-V6. Low voltage.   Laboratory Data:  Chemistry Recent Labs  Lab 11/13/17 1813 11/14/17 0414  NA 139 141  K 4.5 4.2  CL 101 101  CO2 29 33*  GLUCOSE 255* 115*  BUN 30* 26*  CREATININE 1.29* 1.18*  CALCIUM 9.2 9.1  GFRNONAA 35* 39*  GFRAA 40* 45*  ANIONGAP 9 7    Recent Labs  Lab 11/13/17 1813  PROT 6.9  ALBUMIN 3.3*  AST 25  ALT 15  ALKPHOS 88  BILITOT 0.8   Hematology Recent Labs  Lab 11/13/17 1813 11/14/17 0414  WBC 9.6 8.1  RBC 4.24 3.91  HGB 13.1 11.9*  HCT 42.8 38.8  MCV 100.9* 99.2  MCH 30.9 30.4  MCHC 30.6 30.7  RDW 14.4 14.3  PLT 131* 140*   Cardiac Enzymes Recent Labs  Lab 11/13/17 2330 11/14/17 0414  TROPONINI 5.31* 15.06*    Recent Labs  Lab 11/13/17 1947    TROPIPOC 1.55*    BNPNo results for input(s): BNP, PROBNP in the last 168 hours.  DDimer No results for input(s): DDIMER in the last 168 hours.  Radiology/Studies:  Dg Chest 2 View  Result Date: 11/13/2017 CLINICAL DATA:  Epigastric pain and nausea EXAM: CHEST - 2 VIEW COMPARISON:  Chest radiograph 04/09/2017. FINDINGS: Monitoring leads overlie the patient. Stable cardiac and mediastinal contours. Aortic atherosclerosis. Small left pleural effusion. Bibasilar heterogeneous opacities. No pneumothorax. Thoracic spine degenerative changes. IMPRESSION: Small left pleural effusion and left basilar opacities favored to represent atelectasis. Heterogeneous opacities right lung base may represent atelectasis or infection. Electronically Signed   By: Lovey Newcomer M.D.   On: 11/13/2017 20:11    Assessment and Plan:   1. Abdominal pain, nausea and vomiting, resolved without apparent cause or intervention - per primary team.  2. NSTEMI troponin of 15 - the patient refuses to really elaborate on any further symptoms and tells me she just wants to "let it be." I had long conversation with patient's daughter. Given patient's advanced age, worsening dementia, chronic kidney disease, and general lack of angina, conservative management is most appropriate. She is not a cath for candidate at this time. We do not feel that invasive ischemic assessment would impact her current quality of life as her worsening mental decline seems to be the biggest impedence of her day to day functioning. Izora Gala relays concern over the patient's tendency to become agitated, paranoid, and hostile which may need to be addressed via pharmacologic therapy suggestions from the internal medicine team. Will stop heparin per pharmacy and transition to VTE prophylaxis. Continue ASA. MAR indicates she hasn't been taking Plavix. With heme positive stools, would not resume at this time but can consider if Hgb remains stable and no further evidence of  bleeding. Continue low dose beta blocker as BP allows. She is not on statin given her history of gait issues while on statin.   3. Probable ischemic cardiomyopathy - no signs of HF on exam. Blood pressure is too soft to consider ACEI, ARB, ARNI or spironolactone.   4. Intermittent hypoxia - CXR abnormal, will defer to IM whether any specific intervention should be undertaken.  5. Dementia - there have been concerns about patient's ability to care for herself without assistance. Will definitely need dc planning to address this.   6. Anemia with heme + stool - per internal medicine. As above, per d/w Dr. Radford Pax, stop full dose heparin and transition to DVT ppx.  Recommend primary team visit code  status with patient/family in a goals of care conversation to help [place] and family understand how best to maintain Ms. Asberry's quality of life.  For questions or updates, please contact [place] Please consult [email or web address] for contact info under Cardiology/STEMI.    Signed, Charlie Pitter, PA-C  11/14/2017 4:23 PM

## 2017-11-14 NOTE — ED Notes (Signed)
Attempted report 

## 2017-11-14 NOTE — ED Notes (Signed)
Scott RN notified on pt.'s elevated Troponin result .

## 2017-11-15 DIAGNOSIS — I5021 Acute systolic (congestive) heart failure: Secondary | ICD-10-CM

## 2017-11-15 DIAGNOSIS — B961 Klebsiella pneumoniae [K. pneumoniae] as the cause of diseases classified elsewhere: Secondary | ICD-10-CM

## 2017-11-15 DIAGNOSIS — I130 Hypertensive heart and chronic kidney disease with heart failure and stage 1 through stage 4 chronic kidney disease, or unspecified chronic kidney disease: Secondary | ICD-10-CM

## 2017-11-15 DIAGNOSIS — R8271 Bacteriuria: Secondary | ICD-10-CM

## 2017-11-15 DIAGNOSIS — Z66 Do not resuscitate: Secondary | ICD-10-CM

## 2017-11-15 LAB — URINE CULTURE

## 2017-11-15 LAB — GLUCOSE, CAPILLARY
GLUCOSE-CAPILLARY: 145 mg/dL — AB (ref 65–99)
Glucose-Capillary: 132 mg/dL — ABNORMAL HIGH (ref 65–99)
Glucose-Capillary: 283 mg/dL — ABNORMAL HIGH (ref 65–99)
Glucose-Capillary: 95 mg/dL (ref 65–99)

## 2017-11-15 MED ORDER — ENOXAPARIN SODIUM 30 MG/0.3ML ~~LOC~~ SOLN
30.0000 mg | SUBCUTANEOUS | Status: DC
  Administered 2017-11-15 – 2017-11-16 (×2): 30 mg via SUBCUTANEOUS
  Filled 2017-11-15 (×2): qty 0.3

## 2017-11-15 MED ORDER — CEPHALEXIN 250 MG PO CAPS
250.0000 mg | ORAL_CAPSULE | Freq: Two times a day (BID) | ORAL | Status: DC
  Administered 2017-11-15 – 2017-11-16 (×3): 250 mg via ORAL
  Filled 2017-11-15 (×4): qty 1

## 2017-11-15 NOTE — Clinical Social Work Note (Signed)
Clinical Social Work Assessment  Patient Details  Name: [name] MRN: [id number] Date of Birth: 18-Jan-1922  Date of referral:  11/15/17               Reason for consult:  Facility Placement, Discharge Planning                Permission sought to share information with:  Facility Sport and exercise psychologist, Family Supports Permission granted to share information::  Yes, Verbal Permission Granted  Name::     Psychologist, clinical::  SNF's  Relationship::  Daughter  Contact Information:  [phone]  Housing/Transportation Living arrangements for the past 2 months:  Apartment Source of Information:  Medical Team, Adult Children, Facility Patient Interpreter Needed:  None Criminal Activity/Legal Involvement Pertinent to Current Situation/Hospitalization:  No - Comment as needed Significant Relationships:  Adult Children Lives with:  Self Do you feel safe going back to the place where you live?  Yes Need for family participation in patient care:  Yes (Comment)  Care giving concerns:  Plan for SNF at discharge.  Social Worker assessment / plan:  Patient not fully oriented. No supports at bedside. CSW called patient's daughter, introduced role, and explained that discharge planning would be discussed. Patient's daughter confirmed plan for SNF and that [name] is first preference. CSW spoke with admissions coordinator. They have had patient many times in the past. Admissions coordinator wants daughter to call her regarding admissions process and confirm plan to do paperwork prior to admission. CSW left admissions coordinator's phone number on daughter's voicemail. No further concerns. CSW encouraged patient's daughter to contact CSW as needed. CSW will continue to follow patient and her daughter for support and facilitate discharge to SNF once medically stable.  Employment status:  Retired Forensic scientist:  Other (Comment Required)([facility]) PT Recommendations:  Not assessed at this  time Information / Referral to community resources:  [place]  Patient/Family's Response to care:  Patient not fully oriented. Patient's daughter agreeable to SNF placement. Patient's daughters supportive and involved in patient's care. Patient's daughter appreciated social work intervention.  Patient/Family's Understanding of and Emotional Response to Diagnosis, Current Treatment, and Prognosis:  Patient not fully oriented. Patient's daughter has a good understanding of the reason for admission and plan for SNF at discharge. Patient's daughter appreciated social work intervention.  Emotional Assessment Appearance:  Appears stated age Attitude/Demeanor/Rapport:  Unable to Assess Affect (typically observed):  Unable to Assess Orientation:  Oriented to Self, Oriented to Place Alcohol / Substance use:  Never Used Psych involvement (Current and /or in the community):  No (Comment)  Discharge Needs  Concerns to be addressed:  Care Coordination Readmission within the last 30 days:  No Current discharge risk:  Cognitively Impaired, Dependent with Mobility, Lives alone Barriers to Discharge:  Continued Medical Work up   Joanna Chroman, LCSW 11/15/2017, 4:47 PM

## 2017-11-15 NOTE — Progress Notes (Signed)
   Subjective:   Ms. Rodier was seen resting in her bed this morning doing well. She stated that she was not in any pain and felt "like a young woman".  Objective:  Vital signs in last 24 hours: Vitals:   11/14/17 2200 11/15/17 0009 11/15/17 0505 11/15/17 0903  BP:  (!) 123/105 101/69 115/67  Pulse: 83 90 76 77  Resp: (!) 23  20   Temp:   98 F (36.7 C)   TempSrc:   Oral   SpO2: 99%  95%   Weight:      Height:       Physical Exam  Constitutional: She appears well-developed and well-nourished. No distress.  HENT:  Head: Normocephalic and atraumatic.  Eyes: Conjunctivae are normal.  Cardiovascular: Normal rate, regular rhythm and normal heart sounds.  Respiratory: Effort normal and breath sounds normal. No respiratory distress. She has no wheezes.  GI: Soft. Bowel sounds are normal. She exhibits no distension. There is no tenderness.  Musculoskeletal: She exhibits no edema.  Neurological: She is alert.  Skin: She is not diaphoretic. No erythema.  Psychiatric: She has a normal mood and affect. Her behavior is normal. Judgment and thought content normal.   Assessment/Plan:  Ms Smullen is a 84 y.o f with CAD, CKD III, DM, HTN, OA who presented with abdominal pain and nausea/vomiting. Troponin elevated and peaked to 15 and ekg showed some t wave inversions. Started on heparin drip and aspirin started. Cardiology consulted.   NSTEMI  Dilated Cardiomyopathy The patient states that she does not have any chest pain or shortness of breath. Cardiology evaluated patient and recommended continuing conservative medical management given the patient's risk factors and age.   -Heparin has been discontinued -Aspirin 81mg   -Continuing home metoprolol 12.5mg  qd -Statin intolerant patient  -Will not add long acting nitrate due to blood pressure being soft -Spoke with patient's daughter yesterday about code status and she mentioned that she will speak with family. Cardiology also spoke with  patient. She was made DNR today. -continue cardiac monitoring  Dementia  The patient is pleasantly demented. Decision making should be done by patient's daughter Izora Gala who is POA.   Diabetes Mellitus  Patient's blood glucose has ranged 132-152 over the past day.   -SSI sensitive  CKD III  -Avoid nephrotoxic medications   Dispo: Anticipated discharge in approximately 1-2 day(s). Patient's daughter would like to pursue SNF.   Lars Mage, MD 11/15/2017, 1:03 PM Pager: [phone]

## 2017-11-15 NOTE — Care Management Note (Addendum)
Case Management Note  Patient Details  Name: [name] MRN: [id number] Date of Birth: Feb 06, 1922  Subjective/Objective:             Patient admitted with MI. From home alone. Has 2 hours of [facility] in AM and PM from [facility]. Spoke with daughter Izora Gala and Martell RN Verne Spurr. They provided the following information. Pt won't go to daytime respite care, she falls a lot at home, three times in the past week, with the last fall she broke her emergency alert necklace. She usually won't go to SNF because of her dementia, blindness, and difficulty hearing she has difficulty getting acclimated with environment. She was at [facility] for two months recently but did not want to stay so she returned to current living situation. Izora Gala, her daughter, would like her to go to SNF at least short term to regain strength.  Spoke w patient at the bedside who is agreeable to SNF. LM with [facility] SW Ivory Broad about request for SNF and to clarify if PT consult will be needed or if they will place in SNF w/o one.      PT eval not needed.   [name] Daughter [phone]      Action/Plan:   Expected Discharge Date:                  Expected Discharge Plan:  [place]  In-House Referral:     Discharge planning Services  CM Consult  Post Acute Care Choice:    Choice offered to:     DME Arranged:    DME Agency:     HH Arranged:    [place] Agency:     Status of Service:  In process, will continue to follow  If discussed at Long Length of Stay Meetings, dates discussed:    Additional Comments:  Carles Collet, RN 11/15/2017, 11:04 AM

## 2017-11-15 NOTE — Progress Notes (Signed)
Notified Terri Coltrane w [facility] that patient has been admitted.

## 2017-11-15 NOTE — Clinical Social Work Placement (Signed)
   CLINICAL SOCIAL WORK PLACEMENT  NOTE  Date:  11/15/2017  Patient Details  Name: [name] MRN: [id number] Date of Birth: August 31, 1921  Clinical Social Work is seeking post-discharge placement for this patient at the [place] level of care (*CSW will initial, date and re-position this form in  chart as items are completed):  Yes   Patient/family provided with [place] Work Department's list of facilities offering this level of care within the geographic area requested by the patient (or if unable, by the patient's family).  Yes   Patient/family informed of their freedom to choose among providers that offer the needed level of care, that participate in Medicare, Medicaid or managed care program needed by the patient, have an available bed and are willing to accept the patient.  Yes   Patient/family informed of [facility]'s ownership interest in [facility] and [facility], as well as of the fact that they are under no obligation to receive care at these facilities.  PASRR submitted to EDS on 11/15/17     PASRR number received on       Existing PASRR number confirmed on 11/15/17     FL2 transmitted to all facilities in geographic area requested by pt/family on 11/15/17     FL2 transmitted to all facilities within larger geographic area on       Patient informed that his/her managed care company has contracts with or will negotiate with certain facilities, including the following:            Patient/family informed of bed offers received.  Patient chooses bed at       Physician recommends and patient chooses bed at      Patient to be transferred to   on  .  Patient to be transferred to facility by       Patient family notified on   of transfer.  Name of family member notified:        PHYSICIAN Please sign FL2     Additional Comment:    _______________________________________________ Candie Chroman, LCSW 11/15/2017, 4:51  PM

## 2017-11-15 NOTE — Discharge Summary (Signed)
**Note De-Identified Bailey Obfuscation** Name: [name] MRN: [id number] DOB: 1922/01/29 84 y.o. PCP: Joanna Adie, MD  Date of Admission: 11/13/2017  5:28 PM Date of Discharge: 11/17/2017 Attending Physician: Sid Falcon, MD  Discharge Diagnosis: 1. NSTEMI  2. Dilated Cardiomyopathy  3. Klebsiella UTI  4. T2DM  5. CKD Stage III 6. Dementia  7. HTN 8. Depression   Principal Problem:   NSTEMI (non-ST elevated myocardial infarction) ([place]) Active Problems:   CKD (chronic kidney disease), stage III (HCC)   DCM (dilated cardiomyopathy) (HCC)   Bacteriuria with Klebsiella pneumoniae   Discharge Medications: Allergies as of 11/17/2017      Reactions   Fluticasone-salmeterol Other (See Comments)   Unknown reaction   Statins Other (See Comments)   Per MAR      Medication List    STOP taking these medications   apixaban 5 MG Tabs tablet Commonly known as:  ELIQUIS   clopidogrel 75 MG tablet Commonly known as:  PLAVIX   furosemide 20 MG tablet Commonly known as:  LASIX   HYDROcodone-acetaminophen 5-325 MG tablet Commonly known as:  NORCO/VICODIN     TAKE these medications   acetaminophen 650 MG CR tablet Commonly known as:  TYLENOL Take 650 mg by mouth daily. What changed:  Another medication with the same name was removed. Continue taking this medication, and follow the directions you see here.   aspirin 81 MG EC tablet Take 1 tablet (81 mg total) by mouth daily. Start taking on:  11/18/2017   cephALEXin 250 MG capsule Commonly known as:  KEFLEX Take 1 capsule (250 mg total) by mouth every 12 (twelve) hours.   glipiZIDE 5 MG 24 hr tablet Commonly known as:  GLUCOTROL XL Take 5 mg by mouth daily with breakfast.   GLUCERNA Liqd Take 237 mLs by mouth daily.   guaiFENesin 100 MG/5ML Soln Commonly known as:  ROBITUSSIN Take 10 mLs by mouth every 6 (six) hours as needed for cough or to loosen phlegm.   loperamide 2 MG tablet Commonly known as:  IMODIUM A-D Take 2 mg by mouth as needed  for diarrhea or loose stools.   metoprolol succinate 25 MG 24 hr tablet Commonly known as:  TOPROL XL Take 0.5 tablets (12.5 mg total) by mouth daily. Start taking on:  11/18/2017   NATURES TEARS 0.1-0.3 % Soln Generic drug:  Dextran 70-Hypromellose Place 1 drop into both eyes 2 (two) times daily as needed (for dry eyes).   nitroGLYCERIN 0.4 MG SL tablet Commonly known as:  NITROSTAT Place 1 tablet (0.4 mg total) under the tongue every 5 (five) minutes as needed for chest pain (up to 3 doses).   ondansetron 4 MG disintegrating tablet Commonly known as:  ZOFRAN-ODT Take 4 mg by mouth as needed for nausea or vomiting.   pantoprazole 20 MG tablet Commonly known as:  PROTONIX Take 20 mg by mouth daily. What changed:  Another medication with the same name was removed. Continue taking this medication, and follow the directions you see here.   SENNA S 8.6-50 MG tablet Generic drug:  senna-docusate Take 1-2 tablets by mouth See admin instructions. Take 2 tablets every morning then take 1 tablet every evening   sertraline 100 MG tablet Commonly known as:  ZOLOFT Take 100 mg by mouth daily.   triamcinolone 0.1 % cream : eucerin Crea Apply 1 application topically 2 (two) times daily as needed for rash.   Vitamin D (Ergocalciferol) 50000 units Caps capsule Commonly known as:  DRISDOL  Take 50,000 Units by mouth every 30 (thirty) days.       Disposition and follow-up:   Ms.[name] was discharged from [facility] in St[facility] condition.  At the hospital follow up visit please address:  1.  Please assess for cardiac as well as GI symptoms as she presented with abdominal pain and vomiting and was found to have an NSTEMI. Please assess compliance with ASA and metoprolol.   2.  Labs / imaging needed at time of follow-up: None   3.  Pending labs/ test needing follow-up: None   Follow-up Appointments: Contact information for after-discharge care    Destination     [facility] .   Service:  Skilled Nursing Contact information: [address] [place] Hospital Course by problem list:  1. NSTEMI (non-ST elevated myocardial infarction) ([place]) 2. DCM (dilated cardiomyopathy) ([place]) [name] is a 84 yo female with significant cardiac history including CAD and STEMI s/p RCA stent in 36 and in 2015 who presented with abdominal pain, N/V, and shortness of breath. Her EKG showed signs of acute ischemia (TWI) and she was found to have elevated troponin, which peaked at 15. Per cardiology, patient was not a candidate for invasive interventions due to advanced age, advanced dementia, and multiple comorbidities and she was managed medically with heparin gtt x 48 hours, ASA, and metoprolol. Statin therapy nto started as family reported intolerance to it.   3. Klebsiella UTI: Patient was found to have >100000 colonies of Klebsiella pneumonia.  This was thought to be likely a chronic bacteriuria however she was treated with a 3-day course of Keflex due to reports of abdominal pain and in the setting of NSTEMI.   4. T2DM: Her blood glucose was adequately controlled during this admission with SSI. Home glipizide 5 mg QD resumed on discharge.   5. CKD Stage III: Renal function remained stable during admission.   6. Dementia: Patient remained alert with various degrees of orientation during admission. Her daughter [name]) is HCPOA.    7. HTN: Her home metoprolol was continued during admission. Home Lasix was held in the setting of hypotension. On day of discharge, she remained euvolemic and blood pressure were improved but soft (sBP 100s) therefore Lasix was held until she is evaluated by medical provider at SNF vs PCP.   8. Depression: Home Zoloft was continued during this admission.   Discharge Vitals:   BP 102/67 (BP Location: Right Arm)   Pulse 87   Temp 98 F (36.7 C)  (Oral)   Resp (!) 24   Ht 5' (1.524 m)   Wt 128 lb 4.9 oz (58.2 kg)   SpO2 96%   BMI 25.06 kg/m   Pertinent Labs, Studies, and Procedures:  CBC Latest Ref Rng & Units 11/14/2017 11/13/2017 11/10/2015  WBC 4.0 - 10.5 K/uL 8.1 9.6 6.0  Hemoglobin 12.0 - 15.0 g/dL 11.9(L) 13.1 11.9(L)  Hematocrit 36.0 - 46.0 % 38.8 42.8 39.1  Platelets 150 - 400 K/uL 140(L) 131(L) 154   BMP Latest Ref Rng & Units 11/14/2017 11/13/2017 11/10/2015  Glucose 65 - 99 mg/dL 115(H) 255(H) 361(H)  BUN 6 - 20 mg/dL 26(H) 30(H) 18  Creatinine 0.44 - 1.00 mg/dL 1.18(H) 1.29(H) 1.28(H)  Sodium 135 - 145 mmol/L 141 139 139  Potassium 3.5 - 5.1 mmol/L 4.2 4.5 5.1  Chloride 101 - 111  mmol/L 101 101 100(L)  CO2 22 - 32 mmol/L 33(H) 29 36(H)  Calcium 8.9 - 10.3 mg/dL 9.1 9.2 9.1   CXR: FINDINGS: Monitoring leads overlie the patient. Stable cardiac and mediastinal contours. Aortic atherosclerosis. Small left pleural effusion. Bibasilar heterogeneous opacities. No pneumothorax. Thoracic spine degenerative changes.  TTE 5/30: Study Conclusions - Left ventricle: The cavity size was normal. There was mild   concentric hypertrophy. Systolic function was mildly reduced. The   estimated ejection fraction was in the range of 45% to 50%. Mild   hypokinesis of the anterolateral and inferolateral myocardium.   Doppler parameters are consistent with abnormal left ventricular   relaxation (grade 1 diastolic dysfunction). Doppler parameters   are consistent with indeterminate ventricular filling pressure. - Aortic valve: Transvalvular velocity was within the normal range.   There was no stenosis. There was no regurgitation. - Mitral valve: Transvalvular velocity was within the normal range.   There was no evidence for stenosis. There was mild regurgitation. - Atrial septum: No defect or patent foramen ovale was identified   by color flow Doppler. - Tricuspid valve: There was no regurgitation. - Pericardium, extracardiac: A  small pericardial effusion was   identified.   Discharge Instructions: Discharge Instructions    Call MD for:  persistant dizziness or light-headedness   Complete by:  As directed    Call MD for:  persistant nausea and vomiting   Complete by:  As directed    Call MD for:  severe uncontrolled pain   Complete by:  As directed    Diet - low sodium heart healthy   Complete by:  As directed    Diet Carb Modified   Complete by:  As directed    Discharge instructions   Complete by:  As directed    Ms. Doss,   You were admitted to the hospital due to a heart attack. Because you were not a candidate for invasive procedures, we treated you with medications. You will continue taking metoprolol 12.5 mg every day as well as a baby aspirin (81 mg) every day. Please let your provider know if you continue to experience similar symptoms that brought you to the hospital such as chest, arm or jawpain, palpitations, shortness of breath, abdominal pain, and nausea and vomiting.   Because your blood pressure was low while you were in the hospital we recommend you stop taking Lasix for now until you are evaluated by a medical provider in the outpatient setting. This could be the provider at the skilled nursing facility or your primary care provider, whoever you are able to see sooner.  For your diabetes continue taking glipizide 5 mg daily.  Please call us if you have any questions.   Increase activity slowly   Complete by:  As directed       Signed: Welford Roche, MD 11/17/2017, 11:43 AM   Pager: Pager: [phone]

## 2017-11-15 NOTE — NC FL2 (Signed)
[place] LEVEL OF CARE SCREENING TOOL     IDENTIFICATION  Patient Name: [name] Birthdate: Oct 22, 1921 Sex: female Admission Date (Current Location): 11/13/2017  [facility] and [place] Number:  Herbalist and Address:  [facility], [place] [address] [place],  [address]      Provider Number: [id number]  Attending Physician Name and Address:  Axel Filler, *  Relative Name and Phone Number:       Current Level of Care: Hospital Recommended Level of Care: [place] Prior Approval Number:    Date Approved/Denied:   PASRR Number: [id number]  Discharge Plan: SNF    Current Diagnoses: Patient Active Problem List   Diagnosis Date Noted  . DCM (dilated cardiomyopathy) ([place])   . NSTEMI (non-ST elevated myocardial infarction) ([place]) 11/13/2017  . Bruising 08/18/2014  . HOH (hard of hearing) 08/18/2014  . CAD (coronary artery disease)   . CKD (chronic kidney disease), stage III ([place])   . Mild dementia   . Thrombocytopenia ([place])   . Anemia   . Cardiogenic shock ([place])   . ST elevation myocardial infarction (STEMI) involving right coronary artery with complication ([place]) 56/31/4970  . Fall 02/09/2013  . DYSPNEA ON EXERTION 07/07/2009  . URINARY INCONTINENCE 07/07/2009  . Essential hypertension, benign 04/12/2009  . VERTIGO 11/22/2008  . CONSTIPATION 03/31/2007  . DIABETES MELLITUS, II, COMPLICATIONS 26/37/8588  . HYPERCHOLESTEROLEMIA 08/15/2006  . OBESITY, NOS 08/15/2006  . DEPRESSION, MAJOR, RECURRENT 08/15/2006  . MACULAR DEGENERATION 08/15/2006  . MYOCARDIAL INFARCTION, OLD 08/15/2006  . CLAUDICATION, INTERMITTENT 08/15/2006  . RHINITIS, ALLERGIC 08/15/2006  . GASTROESOPHAGEAL REFLUX, NO ESOPHAGITIS 08/15/2006  . OSTEOARTHRITIS OF SPINE, NOS 08/15/2006    Orientation RESPIRATION BLADDER Height & Weight     Self, Place  O2(Nasal Canula 3 L) Continent Weight: 128 lb 12 oz (58.4  kg) Height:  5' (152.4 cm)  BEHAVIORAL SYMPTOMS/MOOD NEUROLOGICAL BOWEL NUTRITION STATUS  (None) (Mild dementia) Continent Diet(Heart healthy/carb modified)  AMBULATORY STATUS COMMUNICATION OF NEEDS Skin     Verbally Normal                       Personal Care Assistance Level of Assistance              Functional Limitations Info  Sight, Hearing, Speech Sight Info: Adequate Hearing Info: Adequate Speech Info: Adequate    SPECIAL CARE FACTORS FREQUENCY  PT (By licensed PT), OT (By licensed OT)     PT Frequency: 5 x week OT Frequency: 5 x week            Contractures Contractures Info: Not present    Additional Factors Info  Code Status, Allergies Code Status Info: DNR Allergies Info: Fluticason-salmeterol, Statins           Current Medications (11/15/2017):  This is the current hospital active medication list Current Facility-Administered Medications  Medication Dose Route Frequency Provider Last Rate Last Dose  . acetaminophen (TYLENOL) tablet 650 mg  650 mg Oral Q6H PRN Alphonzo Grieve, MD       Or  . acetaminophen (TYLENOL) suppository 650 mg  650 mg Rectal Q6H PRN Alphonzo Grieve, MD      . aspirin EC tablet 81 mg  81 mg Oral Daily Alphonzo Grieve, MD   81 mg at 11/15/17 0907  . cephALEXin (KEFLEX) capsule 250 mg  250 mg Oral Q12H Colbert Ewing, MD   250 mg at 11/15/17 1115  .  feeding supplement (GLUCERNA SHAKE) (GLUCERNA SHAKE) liquid 237 mL  237 mL Oral Daily Alphonzo Grieve, MD   237 mL at 11/15/17 0906  . insulin aspart (novoLOG) injection 0-9 Units  0-9 Units Subcutaneous TID WC Alphonzo Grieve, MD   1 Units at 11/15/17 1115  . metoprolol succinate (TOPROL-XL) 24 hr tablet 12.5 mg  12.5 mg Oral Daily Axel Filler, MD   12.5 mg at 11/15/17 0904  . ondansetron (ZOFRAN) tablet 4 mg  4 mg Oral Q6H PRN Alphonzo Grieve, MD       Or  . ondansetron (ZOFRAN) injection 4 mg  4 mg Intravenous Q6H PRN Alphonzo Grieve, MD      . polyvinyl alcohol  (LIQUIFILM TEARS) 1.4 % ophthalmic solution 1 drop  1 drop Both Eyes BID PRN Alphonzo Grieve, MD      . senna-docusate (Senokot-S) tablet 1 tablet  1 tablet Oral QHS PRN Alphonzo Grieve, MD      . sertraline (ZOLOFT) tablet 100 mg  100 mg Oral Daily Alphonzo Grieve, MD   100 mg at 11/15/17 1610     Discharge Medications: Please see discharge summary for a list of discharge medications.  Relevant Imaging Results:  Relevant Lab Results:   Additional Information SS#: [id number]  Candie Chroman, LCSW

## 2017-11-16 DIAGNOSIS — H548 Legal blindness, as defined in USA: Secondary | ICD-10-CM

## 2017-11-16 DIAGNOSIS — H5702 Anisocoria: Secondary | ICD-10-CM

## 2017-11-16 LAB — GLUCOSE, CAPILLARY
GLUCOSE-CAPILLARY: 110 mg/dL — AB (ref 65–99)
Glucose-Capillary: 140 mg/dL — ABNORMAL HIGH (ref 65–99)
Glucose-Capillary: 116 mg/dL — ABNORMAL HIGH (ref 65–99)

## 2017-11-16 LAB — CULTURE, BLOOD (ROUTINE X 2): SPECIAL REQUESTS: ADEQUATE

## 2017-11-16 MED ORDER — CEPHALEXIN 250 MG PO CAPS
250.0000 mg | ORAL_CAPSULE | Freq: Two times a day (BID) | ORAL | Status: DC
  Administered 2017-11-16 – 2017-11-17 (×2): 250 mg via ORAL
  Filled 2017-11-16 (×2): qty 1

## 2017-11-16 NOTE — Progress Notes (Signed)
   Subjective:   [name] was seen resting in bed comfortably this morning. States she's feeling well and denied any chest pain, shortness of breath, abdominal pain or fever. Somewhat agreeable to SNF placement but ultimately wants to return home.   Objective:  Vital signs in last 24 hours: Vitals:   11/15/17 1305 11/15/17 2134 11/16/17 0455 11/16/17 0902  BP: 128/65 124/65 115/64 101/72  Pulse: 81 83 86 86  Resp: 20 (!) 26 (!) 27 17  Temp: 97.8 F (36.6 C) 98.1 F (36.7 C) 98.2 F (36.8 C) 98.1 F (36.7 C)  TempSrc: Axillary Oral Oral Oral  SpO2: 97% 95% 93% 93%  Weight:   133 lb 2.5 oz (60.4 kg)   Height:       General: In no acute distress. Resting comfortably in bed. Pleasant and conversant, but demented.  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 Anisocoria. No conjunctival injection, icterus or ptosis. Oropharynx clear, mucous membranes moist.  Cardiovascular: Regular rate and rhythm. No murmur or rub appreciated. Pulmonary: Breathing comfortably on RA. Grossly clear and saturating well. Abdomen: Soft, non-tender and non-distended. +bowel sounds.  Extremities: No peripheral edema noted BL. Warm, perfused.   Assessment/Plan:  [name] is a 84 y.o f with CAD, CKD III, DM, HTN, OA who presented with abdominal pain and nausea/vomiting. Found to have NSTEMI with troponin >15 and EKG changes suggesting ischemia. She is being managed medically per cardiology recommendations and is also on Keflex for possible Klebsiella UTI. Currently working SNF placement.   NSTEMI  Dilated Cardiomyopathy Continues to deny chest pain, shortness of breath, abdominal pain, nausea or vomiting. Continuing with optimizing medical management per cardiology recommendations, although she is unable to tolerate statin therapy.  -We appreciate cardiologies recommendations; patient now DNR after cards discussion with family -Heparin discontinued, now on Lovenox for DVT PPx -Continue ASA 81mg  and Metoprolol  12.5mg  daily -Will dc telemetry given stability and overall clinical improvement  Klebsiella Bacteruria Ucx with >100k CFU Klebsiella, most likely a chronic bacteruria however treating given her abdominal pain and NSTEMI. Day 2/3 Keflex.   Diabetes Mellitus  CBGs mostly well controlled, although did have one reading of 280 yesterday afternoon which corrected well with SSI. CBG 110 this morning. She's required a total of 6 units SSI over past 24-hrs.  -SSI sensitive  CKD III Cr at baseline during admission. Will continue to avoid nephrotoxic medications.   Dementia  She continues to remain pleasantly demented. HCPOA is daughter Izora Gala who should be involved in decision making including SNF placement.   Dispo: Anticipated discharge in approximately 1-2 day(s). Patient agreeable to SNF; daughter reviewing options. Medically stable for DC once bed available.   Joanna Palladino, DO 11/16/2017, 10:57 AM Pager: [phone]

## 2017-11-16 NOTE — Progress Notes (Signed)
  Date: 11/16/2017  Patient name: [name]  Medical record number: [id number]  Date of birth: 1922-04-24   I have seen and evaluated this patient and I have discussed the plan of care with the house staff. Please see Dr. Rober Minion note for complete details. I concur with her findings.  Sid Falcon, MD 11/16/2017, 12:23 PM

## 2017-11-16 NOTE — Clinical Social Work Note (Signed)
Patient has a bed at [facility] when stable for discharge. Paged MD.  Dayton Scrape, [place]

## 2017-11-17 DIAGNOSIS — R8271 Bacteriuria: Secondary | ICD-10-CM

## 2017-11-17 LAB — GLUCOSE, CAPILLARY
GLUCOSE-CAPILLARY: 122 mg/dL — AB (ref 65–99)
GLUCOSE-CAPILLARY: 119 mg/dL — AB (ref 65–99)

## 2017-11-17 MED ORDER — ASPIRIN 81 MG PO TBEC: 81.0000 mg | DELAYED_RELEASE_TABLET | Freq: Every day | ORAL | 0 refills | Status: AC

## 2017-11-17 MED ORDER — METOPROLOL SUCCINATE ER 25 MG PO TB24: 12.5000 mg | ORAL_TABLET | Freq: Every day | ORAL | 0 refills | Status: DC

## 2017-11-17 MED ORDER — CEPHALEXIN 250 MG PO CAPS: 250.0000 mg | ORAL_CAPSULE | Freq: Two times a day (BID) | ORAL | 0 refills | Status: DC

## 2017-11-17 NOTE — Progress Notes (Signed)
Clinical Social Worker facilitated patient discharge including contacting patient family and facility to confirm patient discharge plans.  Clinical information faxed to facility and family agreeable with plan.  CSW arranged ambulance transport via [facility] to [facility].  RN to call [phone] (will go in rm [phone]) for report prior to discharge.  Clinical Social Worker will sign off for now as social work intervention is no longer needed. Please consult [place] again if new need arises.  Rhea Pink, MSW, [place]

## 2017-11-17 NOTE — Progress Notes (Signed)
Report called to [facility] re[facility], IV and tele dcd. Patient will be transported to SNF via EMS. Nelda Bucks, Bettina Gavia RN

## 2017-11-17 NOTE — Progress Notes (Signed)
  Date: 11/17/2017  Patient name: [name]  Medical record number: [id number]  Date of birth: January 14, 1922   This patient's plan of care was discussed with the house staff. Please see Dr. Frederico Hamman' note for complete details. I concur with her findings.   Sid Falcon, MD 11/17/2017, 1:23 PM

## 2017-11-17 NOTE — Progress Notes (Signed)
   Subjective:  No acute events overnight. Patient eating breakfast this morning when seen and reports doing well. Denies chest pain, palpitations, and shortness of breath.   Objective:  Vital signs in last 24 hours: Vitals:   11/16/17 2007 11/17/17 0500 11/17/17 0800 11/17/17 0823  BP: (!) 100/56 104/75 102/67   Pulse: 84 77  87  Resp: 20 20 (!) 24   Temp: 98.1 F (36.7 C) 98 F (36.7 C)    TempSrc: Oral Oral    SpO2: 95% 96%    Weight:  128 lb 4.9 oz (58.2 kg)    Height:       General: frail elderly female eating breakfast in bed in no acute distress  Cardiac: regular rate and rhythm, nl S1/S2, no murmurs, rubs or gallops  Pulm: CTAB, no increased work of breathing while on room air  Abd: soft, NTND, bowel sounds present, no rebound tender ness, surgical scars  Neuro: A&Ox2, able to move all four extremities spontaneously  Ext: warm and well perfused, no peripheral edema, diffuse bruises over lower extremities   Assessment/Plan:  Active Problems:   NSTEMI (non-ST elevated myocardial infarction) (HCC)   DCM (dilated cardiomyopathy) ([place])  Ms Bonano is a 84 y.o female with history of CAD, CKD III, DM, HTN, OA who presented with abdominal pain, nausea, and vomiting. She was found to have NSTEMI with troponin >15 and EKG changes suggesting ischemia. She is being managed medically per cardiology recommendations. She also completed a 3-day course of Keflex for possible Klebsiella UTI. Will be discharged to today to [facility] facility.   # NSTEMI  # Dilated Cardiomyopathy Hemodynamically stable and doing well. Asymptomatic. Managing medically per cardiology recommendations with the exception of statin therapy as she is unable to tolerate.  - We appreciate cardiologies recommendations; patient now DNR after cards discussion with family - Continue ASA 81mg  and Metoprolol 12.5mg  daily - Will discontinue telemetry given stability and overall clinical improvement  # Klebsiella  Bacteruria: Ucx with >100k CFU Klebsiella, most likely a chronic bacteruria however treating given her abdominal pain and NSTEMI. Day 3/3 Keflex.   # Non insulin-dependent T2DM: CBGs at goal. Will continue current regimen as below.  -SSI sensitive  # CKD Stage III: Renal function has remained at baseline during admission  - Avoid nephrotoxic medications.   # Dementia: Alert and oriented to self and place today. Able to voice needs. HCPOA is daughter Izora Gala who should be involved in decision making. t.    Dispo: Anticipated discharge today if able to arrange transport to SNF.   Welford Roche, MD 11/17/2017, 8:38 AM Pager: [phone]

## 2017-11-18 LAB — GLUCOSE, CAPILLARY: GLUCOSE-CAPILLARY: 152 mg/dL — AB (ref 65–99)

## 2017-11-18 LAB — CULTURE, BLOOD (ROUTINE X 2)
Culture: NO GROWTH
Special Requests: ADEQUATE

## 2018-05-22 ENCOUNTER — Emergency Department (HOSPITAL_COMMUNITY): Payer: Medicare (Managed Care)

## 2018-05-22 ENCOUNTER — Emergency Department (HOSPITAL_COMMUNITY)
Admission: EM | Admit: 2018-05-22 | Discharge: 2018-05-23 | Disposition: A | Discharge status: Home / Self Care | Payer: Medicare (Managed Care) | Attending: Emergency Medicine | Admitting: Emergency Medicine

## 2018-05-22 ENCOUNTER — Other Ambulatory Visit: Payer: Self-pay

## 2018-05-22 DIAGNOSIS — Z7982 Long term (current) use of aspirin: Secondary | ICD-10-CM | POA: Insufficient documentation

## 2018-05-22 DIAGNOSIS — S0083XA Contusion of other part of head, initial encounter: Secondary | ICD-10-CM | POA: Diagnosis not present

## 2018-05-22 DIAGNOSIS — Y939 Activity, unspecified: Secondary | ICD-10-CM | POA: Insufficient documentation

## 2018-05-22 DIAGNOSIS — S0990XA Unspecified injury of head, initial encounter: Secondary | ICD-10-CM | POA: Insufficient documentation

## 2018-05-22 DIAGNOSIS — Z87891 Personal history of nicotine dependence: Secondary | ICD-10-CM | POA: Insufficient documentation

## 2018-05-22 DIAGNOSIS — Z79899 Other long term (current) drug therapy: Secondary | ICD-10-CM | POA: Diagnosis not present

## 2018-05-22 DIAGNOSIS — Y929 Unspecified place or not applicable: Secondary | ICD-10-CM | POA: Insufficient documentation

## 2018-05-22 DIAGNOSIS — W050XXA Fall from non-moving wheelchair, initial encounter: Secondary | ICD-10-CM | POA: Diagnosis not present

## 2018-05-22 DIAGNOSIS — W19XXXA Unspecified fall, initial encounter: Secondary | ICD-10-CM

## 2018-05-22 DIAGNOSIS — Y999 Unspecified external cause status: Secondary | ICD-10-CM | POA: Insufficient documentation

## 2018-05-22 LAB — BASIC METABOLIC PANEL
ANION GAP: 7 (ref 5–15)
BUN: 21 mg/dL (ref 8–23)
CALCIUM: 9.1 mg/dL (ref 8.9–10.3)
CO2: 34 mmol/L — AB (ref 22–32)
Chloride: 99 mmol/L (ref 98–111)
Creatinine, Ser: 1 mg/dL (ref 0.44–1.00)
GFR calc Af Amer: 56 mL/min — ABNORMAL LOW (ref >60)
GFR, EST NON AFRICAN AMERICAN: 48 mL/min — AB (ref >60)
GLUCOSE: 335 mg/dL — AB (ref 70–99)
Potassium: 3.9 mmol/L (ref 3.5–5.1)
Sodium: 140 mmol/L (ref 135–145)

## 2018-05-22 LAB — CBC
HCT: 39.7 % (ref 36.0–46.0)
Hemoglobin: 12.4 g/dL (ref 12.0–15.0)
MCH: 30.8 pg (ref 26.0–34.0)
MCHC: 31.2 g/dL (ref 30.0–36.0)
MCV: 98.8 fL (ref 80.0–100.0)
Platelets: 186 10*3/uL (ref 150–400)
RBC: 4.02 MIL/uL (ref 3.87–5.11)
RDW: 14.4 % (ref 11.5–15.5)
WBC: 6.9 10*3/uL (ref 4.0–10.5)
nRBC: 0 % (ref 0.0–0.2)

## 2018-05-22 LAB — PROTIME-INR
INR: 0.93
Prothrombin Time: 12.3 seconds (ref 11.4–15.2)

## 2018-05-22 NOTE — ED Notes (Signed)
Bed: WA03 Expected date:  Expected time:  Means of arrival:  Comments: EMS_fall 

## 2018-05-22 NOTE — ED Triage Notes (Signed)
Transported by [facility] from [place]-- patient fell out of her wheelchair and hit her head. AAO x 1 which is baseline. No blood thinners noted in patient's MAR. +swelling/hematoma to right side of forehead

## 2018-05-22 NOTE — ED Provider Notes (Addendum)
[facility] DEPT Provider Note   CSN: [id number] Arrival date & time: 05/22/18  1518     History   Chief Complaint Chief Complaint  Patient presents with  . Fall    HPI [name] is a 84 y.o. female.  HPI Patient brought from nursing home.  She reportedly fell out of her wheelchair and hit her head.  At baseline she is alert and oriented x1.  Reportedly patient is not on any blood thinners.  Last admission indicates she has on aspirin.  Patient is nonverbal with me.  She provides no history. Past Medical History:  Diagnosis Date  . Anemia    a. Mild, noted on bloodwork 12/2013.  Marland Kitchen Arthritis    "all over" (02/09/2013)  . Asthma   . Blood transfusion reaction   . CAD (coronary artery disease)    a. prior hx of RCA stent in 1996. b. 12/2013: inferior STEMI s/p extensive PTCA only of occluded RCA (no stents); distal LAD and OM were chronically occluded, EF 55-60%.  . Cardiogenic shock ([place])    a. 12/2013: during STEMI with SBP 60s, AV block/bradycardia, resolved with reperfusion.  . CKD (chronic kidney disease), stage III ([place])    a. "one kidney rotted" (02/09/2013). (?) b. likely stage III by historical bloodwork.  . Depression   . Falls frequently   . GERD (gastroesophageal reflux disease)   . Glaucoma, right eye   . Hernia   . HTN (hypertension)   . Hyperlipidemia   . Mild dementia   . Seasonal allergies   . Skin cancer of nose    "left side" (02/09/2013)  . Stroke [facility]) 1960's   "a light one" (02/09/2013)  . Thrombocytopenia ([place])    a. Mild, noted on bloodwork 12/2013.  . Type II diabetes mellitus ([place])   . Vertigo     Patient Active Problem List   Diagnosis Date Noted  . Bacteriuria with Klebsiella pneumoniae 11/17/2017  . DCM (dilated cardiomyopathy) ([place])   . NSTEMI (non-ST elevated myocardial infarction) ([place]) 11/13/2017  . Bruising 08/18/2014  . HOH (hard of hearing) 08/18/2014  . CAD (coronary artery disease)   . CKD  (chronic kidney disease), stage III ([place])   . Mild dementia ([place])   . Thrombocytopenia ([place])   . Anemia   . Cardiogenic shock ([place])   . ST elevation myocardial infarction (STEMI) involving right coronary artery with complication ([place]) 12/16/1599  . Fall 02/09/2013  . DYSPNEA ON EXERTION 07/07/2009  . URINARY INCONTINENCE 07/07/2009  . Essential hypertension, benign 04/12/2009  . VERTIGO 11/22/2008  . CONSTIPATION 03/31/2007  . DIABETES MELLITUS, II, COMPLICATIONS 09/32/3557  . HYPERCHOLESTEROLEMIA 08/15/2006  . OBESITY, NOS 08/15/2006  . DEPRESSION, MAJOR, RECURRENT 08/15/2006  . MACULAR DEGENERATION 08/15/2006  . MYOCARDIAL INFARCTION, OLD 08/15/2006  . CLAUDICATION, INTERMITTENT 08/15/2006  . RHINITIS, ALLERGIC 08/15/2006  . GASTROESOPHAGEAL REFLUX, NO ESOPHAGITIS 08/15/2006  . OSTEOARTHRITIS OF SPINE, NOS 08/15/2006    Past Surgical History:  Procedure Laterality Date  . APPENDECTOMY    . CARDIAC CATHETERIZATION  2005   Archie Endo 05/07/2006 (02/09/2013)  . CHOLECYSTECTOMY  02/2005   Archie Endo 02/28/2005 (02/09/2013)  . CORONARY ANGIOPLASTY WITH STENT PLACEMENT    . EYE SURGERY Left   . LEFT HEART CATHETERIZATION WITH CORONARY ANGIOGRAM Bilateral 01/15/2014   Procedure: LEFT HEART CATHETERIZATION WITH CORONARY ANGIOGRAM;  Surgeon: Peter M Martinique, MD;  Location: [facility] CATH LAB;  Service: Cardiovascular;  Laterality: Bilateral;  . NEPHRECTOMY Left    Archie Endo 02/27/2005 (  02/09/2013)  . TUBAL LIGATION    . UMBILICAL HERNIA REPAIR    . VITRECTOMY Right    Archie Endo 01/19/2002  (02/09/2013)     OB History   None      Home Medications    Prior to Admission medications   Medication Sig Start Date End Date Taking? Authorizing Provider  acetaminophen (TYLENOL) 325 MG tablet Take 650 mg by mouth daily.   Yes [provider]  bisacodyl (DULCOLAX) 10 MG suppository Place 10 mg rectally daily as needed for moderate constipation.   Yes [provider]  LORazepam (ATIVAN) 2 MG/ML  concentrated solution Take 1 mg by mouth every 4 (four) hours as needed for anxiety. Apply 1 ml to wrist.   Yes [provider]  magnesium hydroxide (MILK OF MAGNESIA) 400 MG/5ML suspension Take 30 mLs by mouth daily as needed for mild constipation.   Yes [provider]  risperiDONE microspheres (RISPERDAL CONSTA) 25 MG injection Inject 25 mg into the muscle every 14 (fourteen) days.   Yes [provider]  senna-docusate (SENNA S) 8.6-50 MG tablet Take 1-2 tablets by mouth 2 (two) times daily. Take 2 tablets every morning then take 1 tablet every evening   Yes [provider]  sertraline (ZOLOFT) 100 MG tablet Take 100 mg by mouth daily.   Yes [provider]  Sodium Phosphates (RA SALINE ENEMA) 19-7 GM/118ML ENEM Place 118 mLs rectally daily as needed (constipation).   Yes [provider]  aspirin 81 MG EC tablet Take 1 tablet (81 mg total) by mouth daily. Patient not taking: Reported on 05/22/2018 11/18/17   Welford Roche, MD  cephALEXin (KEFLEX) 250 MG capsule Take 1 capsule (250 mg total) by mouth every 12 (twelve) hours. Patient not taking: Reported on 05/22/2018 11/17/17   Welford Roche, MD  loperamide (IMODIUM A-D) 2 MG tablet Take 2 mg by mouth as needed for diarrhea or loose stools.    [provider]  metoprolol succinate (TOPROL XL) 25 MG 24 hr tablet Take 0.5 tablets (12.5 mg total) by mouth daily. Patient not taking: Reported on 05/22/2018 11/18/17   Welford Roche, MD  nitroGLYCERIN (NITROSTAT) 0.4 MG SL tablet Place 1 tablet (0.4 mg total) under the tongue every 5 (five) minutes as needed for chest pain (up to 3 doses). 01/19/14   Dunn, Dayna N, PA-C  ondansetron (ZOFRAN-ODT) 4 MG disintegrating tablet Take 4 mg by mouth every 6 (six) hours as needed for nausea or vomiting.     [provider]    Family History Family History  Problem Relation Age of Onset  . Cancer Mother   . Cancer Father     . Diabetes Son   . Diabetes Brother   . Stroke Son   . Stroke Daughter   . Heart attack Neg Hx     Social History Social History   Tobacco Use  . Smoking status: Former Smoker    Packs/day: 3.00    Years: 32.00    Pack years: 96.00    Types: Cigarettes    Last attempt to quit: 06/18/1973    Years since quitting: 44.9  . Smokeless tobacco: Never Used  Substance Use Topics  . Alcohol use: No    Comment: 02/09/2013 "haven't had a drink in 29 yr; used to have a beer once in awhile"  . Drug use: No     Allergies   Fluticasone-salmeterol and Statins   Review of Systems Review of Systems Level 5 caveat cannot  obtain review of systems due to patient dementia or limited verbal.  Physical Exam Updated Vital Signs BP (!) 143/76 (BP Location: Right Arm)   Pulse (!) 104   Resp 20   Ht 5' (1.524 m)   Wt 58 kg   SpO2 92%   BMI 24.97 kg/m   Physical Exam  Constitutional:  Patient is lying with her eyes closed.  She seems to be mumbling slightly.  No respiratory distress.  HENT:    Large hematoma to the right forehead.  Superficial abrasion but no laceration.  Also hematoma to right zygoma.    Eyes: Pupils are equal, round, and reactive to light. EOM are normal.  Neck: Neck supple.  Cardiovascular: Normal rate, regular rhythm, normal heart sounds and intact distal pulses.  Pulmonary/Chest: Effort normal and breath sounds normal. She exhibits no tenderness.  Abdominal: Soft. She exhibits no distension. There is no tenderness.  Musculoskeletal: Normal range of motion. She exhibits no deformity.  Neurological:  Patient is sleeping.  She does awaken slightly.  Difficult to tell if she is somnolent or just dementia.  She seems to mumble a little bit but is not answering any questions.  She does spontaneously reach her hands up to rub her face.  She is not following commands for strength testing.  Skin: Skin is warm and dry.     ED Treatments / Results  Labs (all labs ordered  are listed, but only abnormal results are displayed) Labs Reviewed  BASIC METABOLIC PANEL - Abnormal; Notable for the following components:      Result Value   CO2 34 (*)    Glucose, Bld 335 (*)    GFR calc non Af Amer 48 (*)    GFR calc Af Amer 56 (*)    All other components within normal limits  CBC  PROTIME-INR    EKG None  Radiology Ct Head Wo Contrast  Result Date: 05/22/2018 CLINICAL DATA:  84 y/o F; fall from wheelchair with head injury. Swelling/hematoma to the right-sided forehead. EXAM: CT HEAD WITHOUT CONTRAST CT CERVICAL SPINE WITHOUT CONTRAST TECHNIQUE: Multidetector CT imaging of the head and cervical spine was performed following the standard protocol without intravenous contrast. Multiplanar CT image reconstructions of the cervical spine were also generated. COMPARISON:  05/12/2014 CT head. 09/28/2012 CT cervical spine. FINDINGS: CT HEAD FINDINGS Brain: No evidence of acute infarction, hemorrhage, hydrocephalus, extra-axial collection or mass lesion/mass effect. Advanced chronic microvascular ischemic changes and volume loss of the brain. Multiple small chronic infarcts are present in the left inferior cerebellar hemisphere, right frontal cortex, in the left parietal cortex. The left parietal chronic infarct is new from the prior study. Vascular: Calcific atherosclerosis of carotid siphons and vertebral arteries. No hyperdense vessel identified. Skull: Normal. Negative for fracture or focal lesion. Small right frontal scalp contusion. Sinuses/Orbits: Small fluid level in the left sphenoid sinus. Partial opacification of mastoid air cells. Orbits are unremarkable. Other: None. CT CERVICAL SPINE FINDINGS Alignment: Stable C2-3, C3-4, C4-5 grade 1 anterolisthesis. Stable reversal of cervical curvature with apex at C5. Mild S-shaped curvature of the cervical spine. Skull base and vertebrae: No acute fracture. No primary bone lesion or focal pathologic process. Soft tissues and spinal  canal: No prevertebral fluid or swelling. No visible canal hematoma. Disc levels: Stable advanced multilevel discogenic degenerative changes greatest at C4-C7, right-sided facet hypertrophy at C3-4 and left-sided facet hypertrophy at C2-C5. Uncovertebral and facet hypertrophy results in bony foraminal encroachment at the bilaterally at C3-C7. Multilevel multifactorial spinal  canal stenosis, moderate to severe at C5-6 and C6-7. Upper chest: Negative. Other: Calcific atherosclerosis of the carotid systems with brief retropharyngeal course of common carotid arteries. IMPRESSION: 1. Small right frontal scalp contusion. 2. No acute intracranial abnormality or calvarial fracture. 3. No acute fracture or dislocation of cervical spine. 4. Advanced chronic microvascular ischemic changes and volume loss of the brain. Multiple small chronic infarcts in left inferior cerebellar hemisphere, right frontal cortex, and left parietal cortex. 5. Stable advanced cervical spondylosis greatest at C5-6 and C6-7. Electronically Signed   By: Kristine Garbe M.D.   On: 05/22/2018 17:58   Ct Cervical Spine Wo Contrast  Result Date: 05/22/2018 CLINICAL DATA:  83 y/o F; fall from wheelchair with head injury. Swelling/hematoma to the right-sided forehead. EXAM: CT HEAD WITHOUT CONTRAST CT CERVICAL SPINE WITHOUT CONTRAST TECHNIQUE: Multidetector CT imaging of the head and cervical spine was performed following the standard protocol without intravenous contrast. Multiplanar CT image reconstructions of the cervical spine were also generated. COMPARISON:  05/12/2014 CT head. 09/28/2012 CT cervical spine. FINDINGS: CT HEAD FINDINGS Brain: No evidence of acute infarction, hemorrhage, hydrocephalus, extra-axial collection or mass lesion/mass effect. Advanced chronic microvascular ischemic changes and volume loss of the brain. Multiple small chronic infarcts are present in the left inferior cerebellar hemisphere, right frontal cortex, in  the left parietal cortex. The left parietal chronic infarct is new from the prior study. Vascular: Calcific atherosclerosis of carotid siphons and vertebral arteries. No hyperdense vessel identified. Skull: Normal. Negative for fracture or focal lesion. Small right frontal scalp contusion. Sinuses/Orbits: Small fluid level in the left sphenoid sinus. Partial opacification of mastoid air cells. Orbits are unremarkable. Other: None. CT CERVICAL SPINE FINDINGS Alignment: Stable C2-3, C3-4, C4-5 grade 1 anterolisthesis. Stable reversal of cervical curvature with apex at C5. Mild S-shaped curvature of the cervical spine. Skull base and vertebrae: No acute fracture. No primary bone lesion or focal pathologic process. Soft tissues and spinal canal: No prevertebral fluid or swelling. No visible canal hematoma. Disc levels: Stable advanced multilevel discogenic degenerative changes greatest at C4-C7, right-sided facet hypertrophy at C3-4 and left-sided facet hypertrophy at C2-C5. Uncovertebral and facet hypertrophy results in bony foraminal encroachment at the bilaterally at C3-C7. Multilevel multifactorial spinal canal stenosis, moderate to severe at C5-6 and C6-7. Upper chest: Negative. Other: Calcific atherosclerosis of the carotid systems with brief retropharyngeal course of common carotid arteries. IMPRESSION: 1. Small right frontal scalp contusion. 2. No acute intracranial abnormality or calvarial fracture. 3. No acute fracture or dislocation of cervical spine. 4. Advanced chronic microvascular ischemic changes and volume loss of the brain. Multiple small chronic infarcts in left inferior cerebellar hemisphere, right frontal cortex, and left parietal cortex. 5. Stable advanced cervical spondylosis greatest at C5-6 and C6-7. Electronically Signed   By: Kristine Garbe M.D.   On: 05/22/2018 17:58   Dg Pelvis Portable  Result Date: 05/22/2018 CLINICAL DATA:  Pain after fall EXAM: PORTABLE PELVIS 1-2 VIEWS  COMPARISON:  11/27/2010 FINDINGS: Degenerative disc disease L5-S1 with facet arthropathy. Intact bony pelvis without diastasis. Mild degenerative joint space narrowing of the hips left slightly worse than right. No acute fracture is identified. No joint dislocation seen. There is aortobiiliofemoral atherosclerosis. IMPRESSION: Lower lumbar degenerative disc and facet arthropathy. No acute pelvic or hip fracture. Osteoarthritis of both hips. Electronically Signed   By: Ashley Royalty M.D.   On: 05/22/2018 16:51   Dg Chest Port 1 View  Result Date: 05/22/2018 CLINICAL DATA:  Pain after fall EXAM: PORTABLE  CHEST 1 VIEW COMPARISON:  11/13/2017 FINDINGS: Heart size and mediastinal contours are stable with tortuous atherosclerotic aorta. Linear scarring is seen in the lingula similar to prior. Subtle opacity in the right mid lung is suspicious for a small pleural effusion within what may represent the minor fissure. No overt pulmonary edema. No pulmonary consolidation. Degenerative changes are present about the [facility] and glenohumeral joints. No acute displaced fracture identified. IMPRESSION: 1. Subtle opacity along the minor fissure likely represents trace fluid in the fissure. 2. No acute osseous abnormality. 3. No active pulmonary disease. 4. Aortic atherosclerosis. Electronically Signed   By: Ashley Royalty M.D.   On: 05/22/2018 16:49    Procedures Procedures (including critical care time)  Medications Ordered in ED Medications - No data to display   Initial Impression / Assessment and Plan / ED Course  I have reviewed the triage vital signs and the nursing notes.  Pertinent labs & imaging results that were available during my care of the patient were reviewed by me and considered in my medical decision making (see chart for details).    Patient updated on discharge plan and negative diagnostic studies.  She seems quite hard of hearing but denied any significant pain at this time, she reports she would only  like to have some ice water.   Patient is a very limited historian.  She had a fall with head injury.  Reportedly she has on aspirin but no other anticoagulants.  CT does not show any intracranial bleeding.  He does not have apparent trauma or pain to the remainder of the body however chest x-ray obtained with no acute findings and pelvis no acute findings.  I have put the patient's extremities to range of motion without apparent pain.  She will be returned to nursing home care with instructions for observing for any changes or other signs of injury.  Final Clinical Impressions(s) / ED Diagnoses   Final diagnoses:  Fall, initial encounter  Injury of head, initial encounter  Traumatic hematoma of forehead, initial encounter    ED Discharge Orders    None       Charlesetta Shanks, MD 05/22/18 Hazle Nordmann    Charlesetta Shanks, MD 05/23/18 [phone]

## 2018-05-23 NOTE — ED Notes (Signed)
Ptar on unit for transport pt back to [place].

## 2018-05-27 ENCOUNTER — Emergency Department (HOSPITAL_COMMUNITY)
Admission: EM | Admit: 2018-05-27 | Discharge: 2018-05-27 | Disposition: A | Discharge status: Home / Self Care | Payer: Medicare (Managed Care) | Attending: Emergency Medicine | Admitting: Emergency Medicine

## 2018-05-27 ENCOUNTER — Encounter (HOSPITAL_COMMUNITY): Payer: Self-pay

## 2018-05-27 ENCOUNTER — Emergency Department (HOSPITAL_COMMUNITY): Payer: Medicare (Managed Care)

## 2018-05-27 DIAGNOSIS — Z7982 Long term (current) use of aspirin: Secondary | ICD-10-CM | POA: Diagnosis not present

## 2018-05-27 DIAGNOSIS — I129 Hypertensive chronic kidney disease with stage 1 through stage 4 chronic kidney disease, or unspecified chronic kidney disease: Secondary | ICD-10-CM | POA: Diagnosis not present

## 2018-05-27 DIAGNOSIS — Z79899 Other long term (current) drug therapy: Secondary | ICD-10-CM | POA: Insufficient documentation

## 2018-05-27 DIAGNOSIS — F0390 Unspecified dementia without behavioral disturbance: Secondary | ICD-10-CM | POA: Insufficient documentation

## 2018-05-27 DIAGNOSIS — N183 Chronic kidney disease, stage 3 (moderate): Secondary | ICD-10-CM | POA: Diagnosis not present

## 2018-05-27 DIAGNOSIS — Z955 Presence of coronary angioplasty implant and graft: Secondary | ICD-10-CM | POA: Insufficient documentation

## 2018-05-27 DIAGNOSIS — Z87891 Personal history of nicotine dependence: Secondary | ICD-10-CM | POA: Insufficient documentation

## 2018-05-27 DIAGNOSIS — N3000 Acute cystitis without hematuria: Secondary | ICD-10-CM

## 2018-05-27 DIAGNOSIS — R4182 Altered mental status, unspecified: Secondary | ICD-10-CM | POA: Diagnosis present

## 2018-05-27 DIAGNOSIS — I252 Old myocardial infarction: Secondary | ICD-10-CM | POA: Insufficient documentation

## 2018-05-27 LAB — COMPREHENSIVE METABOLIC PANEL
ALBUMIN: 2.7 g/dL — AB (ref 3.5–5.0)
ALK PHOS: 71 U/L (ref 38–126)
ALT: 13 U/L (ref 0–44)
AST: 19 U/L (ref 15–41)
Anion gap: 13 (ref 5–15)
BUN: 18 mg/dL (ref 8–23)
CALCIUM: 9.1 mg/dL (ref 8.9–10.3)
CO2: 24 mmol/L (ref 22–32)
CREATININE: 0.93 mg/dL (ref 0.44–1.00)
Chloride: 101 mmol/L (ref 98–111)
GFR calc Af Amer: >60 mL/min (ref >60)
GFR calc non Af Amer: 53 mL/min — ABNORMAL LOW (ref >60)
GLUCOSE: 158 mg/dL — AB (ref 70–99)
Potassium: 4.4 mmol/L (ref 3.5–5.1)
SODIUM: 138 mmol/L (ref 135–145)
Total Bilirubin: 0.5 mg/dL (ref 0.3–1.2)
Total Protein: 6.6 g/dL (ref 6.5–8.1)

## 2018-05-27 LAB — URINALYSIS, ROUTINE W REFLEX MICROSCOPIC
Bilirubin Urine: NEGATIVE
Glucose, UA: NEGATIVE mg/dL
Ketones, ur: 5 mg/dL — AB
Nitrite: NEGATIVE
PROTEIN: NEGATIVE mg/dL
Specific Gravity, Urine: 1.012 (ref 1.005–1.030)
WBC, UA: >50 WBC/hpf — ABNORMAL HIGH (ref 0–5)
pH: 6 (ref 5.0–8.0)

## 2018-05-27 LAB — CBC WITH DIFFERENTIAL/PLATELET
ABS IMMATURE GRANULOCYTES: 0.02 10*3/uL (ref 0.00–0.07)
BASOS ABS: 0 10*3/uL (ref 0.0–0.1)
Basophils Relative: 1 %
Eosinophils Absolute: 0.1 10*3/uL (ref 0.0–0.5)
Eosinophils Relative: 2 %
HEMATOCRIT: 40.9 % (ref 36.0–46.0)
HEMOGLOBIN: 12.4 g/dL (ref 12.0–15.0)
Immature Granulocytes: 0 %
LYMPHS ABS: 1.1 10*3/uL (ref 0.7–4.0)
LYMPHS PCT: 17 %
MCH: 30.3 pg (ref 26.0–34.0)
MCHC: 30.3 g/dL (ref 30.0–36.0)
MCV: 100 fL (ref 80.0–100.0)
Monocytes Absolute: 0.5 10*3/uL (ref 0.1–1.0)
Monocytes Relative: 8 %
NEUTROS ABS: 4.6 10*3/uL (ref 1.7–7.7)
Neutrophils Relative %: 72 %
Platelets: 172 10*3/uL (ref 150–400)
RBC: 4.09 MIL/uL (ref 3.87–5.11)
RDW: 14.6 % (ref 11.5–15.5)
WBC: 6.4 10*3/uL (ref 4.0–10.5)
nRBC: 0 % (ref 0.0–0.2)

## 2018-05-27 MED ORDER — CEPHALEXIN 250 MG/5ML PO SUSR
500.0000 mg | ORAL | Status: AC
  Administered 2018-05-27: 500 mg via ORAL
  Filled 2018-05-27: qty 10

## 2018-05-27 MED ORDER — CEPHALEXIN 250 MG/5ML PO SUSR: 500.0000 mg | Freq: Two times a day (BID) | ORAL | 0 refills | Status: AC

## 2018-05-27 NOTE — Discharge Instructions (Signed)
Your work-up was positive for UTI.  Take Keflex as prescribed until finished.  Follow-up with your primary care doctor to ensure resolution of your urinary tract infection.

## 2018-05-27 NOTE — ED Provider Notes (Signed)
[facility] EMERGENCY DEPARTMENT Provider Note   CSN: [id number] Arrival date & time: 05/27/18  0006     History   Chief Complaint Chief Complaint  Patient presents with  . Altered Mental Status    LEVEL 5 CAVEAT 2/2 DEMENTIA  HPI [name] is a 84 y.o. female.  84 y/o female with hx of CAD, CKD, HTN, HLD, dementia presents to the ED for c/o AMS. RN at the facility states that patient has had a decreased appetite today; reports this is not normal for the patient. She has hx of fall 4 days ago at which time the patient was evaluated with negative head and neck CTs. No known repeat fall or injury. The patient has no specific complaints at this time.     Past Medical History:  Diagnosis Date  . Anemia    a. Mild, noted on bloodwork 12/2013.  Marland Kitchen Arthritis    "all over" (02/09/2013)  . Asthma   . Blood transfusion reaction   . CAD (coronary artery disease)    a. prior hx of RCA stent in 1996. b. 12/2013: inferior STEMI s/p extensive PTCA only of occluded RCA (no stents); distal LAD and OM were chronically occluded, EF 55-60%.  . Cardiogenic shock ([place])    a. 12/2013: during STEMI with SBP 60s, AV block/bradycardia, resolved with reperfusion.  . CKD (chronic kidney disease), stage III ([place])    a. "one kidney rotted" (02/09/2013). (?) b. likely stage III by historical bloodwork.  . Depression   . Falls frequently   . GERD (gastroesophageal reflux disease)   . Glaucoma, right eye   . Hernia   . HTN (hypertension)   . Hyperlipidemia   . Mild dementia ([place])   . Seasonal allergies   . Skin cancer of nose    "left side" (02/09/2013)  . Stroke [facility]) 1960's   "a light one" (02/09/2013)  . Thrombocytopenia ([place])    a. Mild, noted on bloodwork 12/2013.  . Type II diabetes mellitus ([place])   . Vertigo     Patient Active Problem List   Diagnosis Date Noted  . Bacteriuria with Klebsiella pneumoniae 11/17/2017  . DCM (dilated cardiomyopathy) ([place])   . NSTEMI  (non-ST elevated myocardial infarction) ([place]) 11/13/2017  . Bruising 08/18/2014  . HOH (hard of hearing) 08/18/2014  . CAD (coronary artery disease)   . CKD (chronic kidney disease), stage III ([place])   . Mild dementia ([place])   . Thrombocytopenia ([place])   . Anemia   . Cardiogenic shock ([place])   . ST elevation myocardial infarction (STEMI) involving right coronary artery with complication ([place]) 35/00/9381  . Fall 02/09/2013  . DYSPNEA ON EXERTION 07/07/2009  . URINARY INCONTINENCE 07/07/2009  . Essential hypertension, benign 04/12/2009  . VERTIGO 11/22/2008  . CONSTIPATION 03/31/2007  . DIABETES MELLITUS, II, COMPLICATIONS 82/99/3716  . HYPERCHOLESTEROLEMIA 08/15/2006  . OBESITY, NOS 08/15/2006  . DEPRESSION, MAJOR, RECURRENT 08/15/2006  . MACULAR DEGENERATION 08/15/2006  . MYOCARDIAL INFARCTION, OLD 08/15/2006  . CLAUDICATION, INTERMITTENT 08/15/2006  . RHINITIS, ALLERGIC 08/15/2006  . GASTROESOPHAGEAL REFLUX, NO ESOPHAGITIS 08/15/2006  . OSTEOARTHRITIS OF SPINE, NOS 08/15/2006    Past Surgical History:  Procedure Laterality Date  . APPENDECTOMY    . CARDIAC CATHETERIZATION  2005   Archie Endo 05/07/2006 (02/09/2013)  . CHOLECYSTECTOMY  02/2005   Archie Endo 02/28/2005 (02/09/2013)  . CORONARY ANGIOPLASTY WITH STENT PLACEMENT    . EYE SURGERY Left   . LEFT HEART CATHETERIZATION WITH CORONARY ANGIOGRAM Bilateral 01/15/2014  Procedure: LEFT HEART CATHETERIZATION WITH CORONARY ANGIOGRAM;  Surgeon: Peter M Martinique, MD;  Location: [facility] CATH LAB;  Service: Cardiovascular;  Laterality: Bilateral;  . NEPHRECTOMY Left    Archie Endo 02/27/2005 (02/09/2013)  . TUBAL LIGATION    . UMBILICAL HERNIA REPAIR    . VITRECTOMY Right    Archie Endo 01/19/2002  (02/09/2013)     OB History   None      Home Medications    Prior to Admission medications   Medication Sig Start Date End Date Taking? Authorizing Provider  acetaminophen (TYLENOL) 325 MG tablet Take 650 mg by mouth daily.    [provider]    aspirin 81 MG EC tablet Take 1 tablet (81 mg total) by mouth daily. Patient not taking: Reported on 05/22/2018 11/18/17   Welford Roche, MD  bisacodyl (DULCOLAX) 10 MG suppository Place 10 mg rectally daily as needed for moderate constipation.    [provider]  cephALEXin (KEFLEX) 250 MG/5ML suspension Take 10 mLs (500 mg total) by mouth 2 (two) times daily for 7 days. 05/27/18 06/03/18  Antonietta Breach, PA-C  loperamide (IMODIUM A-D) 2 MG tablet Take 2 mg by mouth as needed for diarrhea or loose stools.    [provider]  LORazepam (ATIVAN) 2 MG/ML concentrated solution Take 1 mg by mouth every 4 (four) hours as needed for anxiety. Apply 1 ml to wrist.    [provider]  magnesium hydroxide (MILK OF MAGNESIA) 400 MG/5ML suspension Take 30 mLs by mouth daily as needed for mild constipation.    [provider]  metoprolol succinate (TOPROL XL) 25 MG 24 hr tablet Take 0.5 tablets (12.5 mg total) by mouth daily. Patient not taking: Reported on 05/22/2018 11/18/17   Welford Roche, MD  nitroGLYCERIN (NITROSTAT) 0.4 MG SL tablet Place 1 tablet (0.4 mg total) under the tongue every 5 (five) minutes as needed for chest pain (up to 3 doses). 01/19/14   Dunn, Dayna N, PA-C  ondansetron (ZOFRAN-ODT) 4 MG disintegrating tablet Take 4 mg by mouth every 6 (six) hours as needed for nausea or vomiting.     [provider]  risperiDONE microspheres (RISPERDAL CONSTA) 25 MG injection Inject 25 mg into the muscle every 14 (fourteen) days.    [provider]  senna-docusate (SENNA S) 8.6-50 MG tablet Take 1-2 tablets by mouth 2 (two) times daily. Take 2 tablets every morning then take 1 tablet every evening    [provider]  sertraline (ZOLOFT) 100 MG tablet Take 100 mg by mouth daily.    [provider]  Sodium Phosphates (RA SALINE ENEMA) 19-7 GM/118ML ENEM Place 118 mLs rectally daily as needed (constipation).    [provider]    Family History Family History  Problem Relation Age of Onset  . Cancer Mother   . Cancer Father   . Diabetes Son   . Diabetes Brother   . Stroke Son   . Stroke Daughter   . Heart attack Neg Hx     Social History Social History   Tobacco Use  . Smoking status: Former Smoker    Packs/day: 3.00    Years: 32.00    Pack years: 96.00    Types: Cigarettes    Last attempt to quit: 06/18/1973    Years since quitting: 44.9  . Smokeless tobacco: Never Used  Substance Use Topics  . Alcohol use: No    Comment: 02/09/2013 "haven't had a drink in 29 yr; used to have a beer  once in awhile"  . Drug use: No     Allergies   Fluticasone-salmeterol and Statins   Review of Systems Review of Systems  Unable to perform ROS: Dementia     Physical Exam Updated Vital Signs BP (!) 109/58   Pulse 96   Resp 20   SpO2 98%   Physical Exam  Constitutional: She appears well-developed and well-nourished. No distress.  Chronically thin, frail. Infraorbital contusion of the right eye.  HENT:  Head: Normocephalic and atraumatic.  Eyes: Conjunctivae and EOM are normal. No scleral icterus.  Neck: Normal range of motion.  Cardiovascular: Regular rhythm and intact distal pulses.  Mild tachycardia  Pulmonary/Chest: Effort normal. No stridor. No respiratory distress. She has no wheezes.  Lungs grossly CTAB  Abdominal: Soft. She exhibits no distension.  Musculoskeletal: Normal range of motion.  Neurological: She is alert.  Alert with goal oriented speech.  Skin: Skin is warm and dry. No rash noted. She is not diaphoretic. No erythema. No pallor.  Psychiatric: She has a normal mood and affect. Her behavior is normal.  Nursing note and vitals reviewed.    ED Treatments / Results  Labs (all labs ordered are listed, but only abnormal results are displayed) Labs Reviewed  COMPREHENSIVE METABOLIC PANEL - Abnormal; Notable for the following components:      Result Value    Glucose, Bld 158 (*)    Albumin 2.7 (*)    GFR calc non Af Amer 53 (*)    All other components within normal limits  URINALYSIS, ROUTINE W REFLEX MICROSCOPIC - Abnormal; Notable for the following components:   APPearance TURBID (*)    Hgb urine dipstick SMALL (*)    Ketones, ur 5 (*)    Leukocytes, UA LARGE (*)    WBC, UA >50 (*)    Bacteria, UA MANY (*)    Non Squamous Epithelial 0-5 (*)    All other components within normal limits  CBC WITH DIFFERENTIAL/PLATELET    EKG EKG Interpretation  Date/Time:  Tuesday May 27 2018 00:38:37 EST Ventricular Rate:  102 PR Interval:    QRS Duration: 85 QT Interval:  342 QTC Calculation: 446 R Axis:   46 Text Interpretation:  Sinus tachycardia Low voltage, extremity and precordial leads Nonspecific T abnormalities, lateral leads Baseline wander in lead(s) I II aVR Confirmed by Ripley Fraise [phone]) on 05/27/2018 12:44:46 AM   Radiology Dg Chest 2 View  Result Date: 05/27/2018 CLINICAL DATA:  Altered mental status. EXAM: CHEST - 2 VIEW COMPARISON:  05/22/2018. FINDINGS: Stable normal sized heart. Stable linear density in the left lower lung zone. Mild increase in subsegmental atelectasis in the right mid to lower lung zone. Diffuse osteopenia. Thoracic spine degenerative changes. IMPRESSION: 1. Mild increase in subsegmental atelectasis in the right mid to lower lung zone. 2. Stable left lower lung zone linear atelectasis or scarring. Electronically Signed   By: Claudie Revering M.D.   On: 05/27/2018 01:15    Procedures Procedures (including critical care time)  Medications Ordered in ED Medications  cephALEXin (KEFLEX) 250 MG/5ML suspension 500 mg (500 mg Oral Given 05/27/18 0325)     Initial Impression / Assessment and Plan / ED Course  I have reviewed the triage vital signs and the nursing notes.  Pertinent labs & imaging results that were available during my care of the patient were reviewed by me and considered in my medical  decision making (see chart for details).     Patient has been diagnosed with  a UTI. Pt is afebrile, normotensive, and without N/V. She does not meet SIRS or sepsis criteria. Pt to be dc to her facility with antibiotics and instructions to follow up with PCP. Patient discharged via [facility] in stable condition.   Final Clinical Impressions(s) / ED Diagnoses   Final diagnoses:  Acute cystitis without hematuria    ED Discharge Orders         Ordered    cephALEXin (KEFLEX) 250 MG/5ML suspension  2 times daily     05/27/18 0313           Antonietta Breach, PA-C 05/27/18 0335    Ripley Fraise, MD 05/27/18 [phone]

## 2018-05-27 NOTE — ED Triage Notes (Addendum)
Patient BIB [facility] from [facility] for altered mental status. Per EMS patient has not eaten today and RN at [facility] thought she should be checked out since she had fallen last week and "this is not her normal". Patient with hx of dementia, A&O x 1 at baseline.   BP 118/62 HR 100 RR 16 100%  CBG 174

## 2018-05-27 NOTE — ED Provider Notes (Signed)
Patient seen/examined in the Emergency Department in conjunction with Midlevel Provider [facility] Patient presents from nursing facility for lack of appetite and "not her normal" Exam : awake/alert, bruising to right side of forehead Pt is pleasantly confused Plan: imaging/labs pending Anticipate discharge if workup if unremarkable     Ripley Fraise, MD 05/27/18 [phone]

## 2018-06-01 ENCOUNTER — Emergency Department (HOSPITAL_COMMUNITY)
Admission: EM | Admit: 2018-06-01 | Discharge: 2018-06-01 | Disposition: A | Discharge status: Home / Self Care | Payer: Medicare (Managed Care) | Attending: Emergency Medicine | Admitting: Emergency Medicine

## 2018-06-01 ENCOUNTER — Emergency Department (HOSPITAL_COMMUNITY): Payer: Medicare (Managed Care)

## 2018-06-01 DIAGNOSIS — N390 Urinary tract infection, site not specified: Secondary | ICD-10-CM | POA: Diagnosis not present

## 2018-06-01 DIAGNOSIS — I129 Hypertensive chronic kidney disease with stage 1 through stage 4 chronic kidney disease, or unspecified chronic kidney disease: Secondary | ICD-10-CM | POA: Insufficient documentation

## 2018-06-01 DIAGNOSIS — Z87891 Personal history of nicotine dependence: Secondary | ICD-10-CM | POA: Insufficient documentation

## 2018-06-01 DIAGNOSIS — N183 Chronic kidney disease, stage 3 (moderate): Secondary | ICD-10-CM | POA: Insufficient documentation

## 2018-06-01 DIAGNOSIS — F0390 Unspecified dementia without behavioral disturbance: Secondary | ICD-10-CM | POA: Diagnosis not present

## 2018-06-01 DIAGNOSIS — I2510 Atherosclerotic heart disease of native coronary artery without angina pectoris: Secondary | ICD-10-CM | POA: Insufficient documentation

## 2018-06-01 DIAGNOSIS — R319 Hematuria, unspecified: Secondary | ICD-10-CM | POA: Diagnosis present

## 2018-06-01 DIAGNOSIS — E1122 Type 2 diabetes mellitus with diabetic chronic kidney disease: Secondary | ICD-10-CM | POA: Insufficient documentation

## 2018-06-01 DIAGNOSIS — Z955 Presence of coronary angioplasty implant and graft: Secondary | ICD-10-CM | POA: Diagnosis not present

## 2018-06-01 DIAGNOSIS — J45909 Unspecified asthma, uncomplicated: Secondary | ICD-10-CM | POA: Diagnosis not present

## 2018-06-01 DIAGNOSIS — Z79899 Other long term (current) drug therapy: Secondary | ICD-10-CM | POA: Diagnosis not present

## 2018-06-01 LAB — COMPREHENSIVE METABOLIC PANEL
ALT: 14 U/L (ref 0–44)
AST: 16 U/L (ref 15–41)
Albumin: 2.7 g/dL — ABNORMAL LOW (ref 3.5–5.0)
Alkaline Phosphatase: 75 U/L (ref 38–126)
Anion gap: 12 (ref 5–15)
BUN: 33 mg/dL — ABNORMAL HIGH (ref 8–23)
CO2: 28 mmol/L (ref 22–32)
CREATININE: 1.32 mg/dL — AB (ref 0.44–1.00)
Calcium: 9.4 mg/dL (ref 8.9–10.3)
Chloride: 97 mmol/L — ABNORMAL LOW (ref 98–111)
GFR calc Af Amer: 40 mL/min — ABNORMAL LOW (ref >60)
GFR calc non Af Amer: 34 mL/min — ABNORMAL LOW (ref >60)
Glucose, Bld: 245 mg/dL — ABNORMAL HIGH (ref 70–99)
Potassium: 3.7 mmol/L (ref 3.5–5.1)
Sodium: 137 mmol/L (ref 135–145)
TOTAL PROTEIN: 6.7 g/dL (ref 6.5–8.1)
Total Bilirubin: 0.5 mg/dL (ref 0.3–1.2)

## 2018-06-01 LAB — I-STAT VENOUS BLOOD GAS, ED
Acid-Base Excess: 3 mmol/L — ABNORMAL HIGH (ref 0.0–2.0)
Bicarbonate: 29 mmol/L — ABNORMAL HIGH (ref 20.0–28.0)
O2 Saturation: 62 %
TCO2: 30 mmol/L (ref 22–32)
pCO2, Ven: 47.5 mmHg (ref 44.0–60.0)
pH, Ven: 7.394 (ref 7.250–7.430)
pO2, Ven: 33 mmHg (ref 32.0–45.0)

## 2018-06-01 LAB — CBC WITH DIFFERENTIAL/PLATELET
Abs Immature Granulocytes: 0.02 10*3/uL (ref 0.00–0.07)
Basophils Absolute: 0 10*3/uL (ref 0.0–0.1)
Basophils Relative: 0 %
Eosinophils Absolute: 0.1 10*3/uL (ref 0.0–0.5)
Eosinophils Relative: 1 %
HEMATOCRIT: 39 % (ref 36.0–46.0)
Hemoglobin: 11.9 g/dL — ABNORMAL LOW (ref 12.0–15.0)
Immature Granulocytes: 0 %
Lymphocytes Relative: 12 %
Lymphs Abs: 0.9 10*3/uL (ref 0.7–4.0)
MCH: 30 pg (ref 26.0–34.0)
MCHC: 30.5 g/dL (ref 30.0–36.0)
MCV: 98.2 fL (ref 80.0–100.0)
MONO ABS: 0.6 10*3/uL (ref 0.1–1.0)
Monocytes Relative: 8 %
Neutro Abs: 5.7 10*3/uL (ref 1.7–7.7)
Neutrophils Relative %: 79 %
Platelets: 129 10*3/uL — ABNORMAL LOW (ref 150–400)
RBC: 3.97 MIL/uL (ref 3.87–5.11)
RDW: 14.2 % (ref 11.5–15.5)
WBC: 7.3 10*3/uL (ref 4.0–10.5)
nRBC: 0 % (ref 0.0–0.2)

## 2018-06-01 LAB — URINALYSIS, ROUTINE W REFLEX MICROSCOPIC
Bilirubin Urine: NEGATIVE
Glucose, UA: 50 mg/dL — AB
Ketones, ur: NEGATIVE mg/dL
Nitrite: NEGATIVE
Protein, ur: NEGATIVE mg/dL
Specific Gravity, Urine: 1.02 (ref 1.005–1.030)
WBC, UA: >50 WBC/hpf — ABNORMAL HIGH (ref 0–5)
pH: 5 (ref 5.0–8.0)

## 2018-06-01 LAB — PROTIME-INR
INR: 0.97
Prothrombin Time: 12.8 seconds (ref 11.4–15.2)

## 2018-06-01 LAB — VALPROIC ACID LEVEL: Valproic Acid Lvl: <10 ug/mL — ABNORMAL LOW (ref 50.0–100.0)

## 2018-06-01 MED ORDER — SODIUM CHLORIDE 0.9 % IV SOLN
2.0000 g | Freq: Once | INTRAVENOUS | Status: AC
  Administered 2018-06-01: 2 g via INTRAVENOUS
  Filled 2018-06-01: qty 20

## 2018-06-01 MED ORDER — SODIUM CHLORIDE 0.9 % IV BOLUS
1000.0000 mL | Freq: Once | INTRAVENOUS | Status: AC
  Administered 2018-06-01: 1000 mL via INTRAVENOUS

## 2018-06-01 MED ORDER — SODIUM CHLORIDE 0.9 % IV BOLUS
500.0000 mL | Freq: Once | INTRAVENOUS | Status: AC
  Administered 2018-06-01: 500 mL via INTRAVENOUS

## 2018-06-01 NOTE — ED Notes (Signed)
Called [facility] for Transport to [facility]

## 2018-06-01 NOTE — ED Provider Notes (Signed)
[facility] EMERGENCY DEPARTMENT Provider Note   CSN: [id number] Arrival date & time: 06/01/18  1001     History   Chief Complaint No chief complaint on file.   HPI ANAISHA MAGO is a 84 y.o. female.  HPI  Level 5 caveat secondary to patient being altered and not being able to give any history Patient arrived via [facility] Per RN note EMS reports that nursing home stated patient was usually boisterous but had not been like that this morning yelling at staff to "leave me alone" On my evaluation elderly female who appears somewhat unkempt and is not responding verbally  Past Medical History:  Diagnosis Date  . Anemia    a. Mild, noted on bloodwork 12/2013.  Marland Kitchen Arthritis    "all over" (02/09/2013)  . Asthma   . Blood transfusion reaction   . CAD (coronary artery disease)    a. prior hx of RCA stent in 1996. b. 12/2013: inferior STEMI s/p extensive PTCA only of occluded RCA (no stents); distal LAD and OM were chronically occluded, EF 55-60%.  . Cardiogenic shock ([place])    a. 12/2013: during STEMI with SBP 60s, AV block/bradycardia, resolved with reperfusion.  . CKD (chronic kidney disease), stage III ([place])    a. "one kidney rotted" (02/09/2013). (?) b. likely stage III by historical bloodwork.  . Depression   . Falls frequently   . GERD (gastroesophageal reflux disease)   . Glaucoma, right eye   . Hernia   . HTN (hypertension)   . Hyperlipidemia   . Mild dementia ([place])   . Seasonal allergies   . Skin cancer of nose    "left side" (02/09/2013)  . Stroke [facility]) 1960's   "a light one" (02/09/2013)  . Thrombocytopenia ([place])    a. Mild, noted on bloodwork 12/2013.  . Type II diabetes mellitus ([place])   . Vertigo     Patient Active Problem List   Diagnosis Date Noted  . Bacteriuria with Klebsiella pneumoniae 11/17/2017  . DCM (dilated cardiomyopathy) ([place])   . NSTEMI (non-ST elevated myocardial infarction) ([place]) 11/13/2017  . Bruising 08/18/2014  .  HOH (hard of hearing) 08/18/2014  . CAD (coronary artery disease)   . CKD (chronic kidney disease), stage III ([place])   . Mild dementia ([place])   . Thrombocytopenia ([place])   . Anemia   . Cardiogenic shock ([place])   . ST elevation myocardial infarction (STEMI) involving right coronary artery with complication ([place]) 30/16/0109  . Fall 02/09/2013  . DYSPNEA ON EXERTION 07/07/2009  . URINARY INCONTINENCE 07/07/2009  . Essential hypertension, benign 04/12/2009  . VERTIGO 11/22/2008  . CONSTIPATION 03/31/2007  . DIABETES MELLITUS, II, COMPLICATIONS 32/35/5732  . HYPERCHOLESTEROLEMIA 08/15/2006  . OBESITY, NOS 08/15/2006  . DEPRESSION, MAJOR, RECURRENT 08/15/2006  . MACULAR DEGENERATION 08/15/2006  . MYOCARDIAL INFARCTION, OLD 08/15/2006  . CLAUDICATION, INTERMITTENT 08/15/2006  . RHINITIS, ALLERGIC 08/15/2006  . GASTROESOPHAGEAL REFLUX, NO ESOPHAGITIS 08/15/2006  . OSTEOARTHRITIS OF SPINE, NOS 08/15/2006    Past Surgical History:  Procedure Laterality Date  . APPENDECTOMY    . CARDIAC CATHETERIZATION  2005   Archie Endo 05/07/2006 (02/09/2013)  . CHOLECYSTECTOMY  02/2005   Archie Endo 02/28/2005 (02/09/2013)  . CORONARY ANGIOPLASTY WITH STENT PLACEMENT    . EYE SURGERY Left   . LEFT HEART CATHETERIZATION WITH CORONARY ANGIOGRAM Bilateral 01/15/2014   Procedure: LEFT HEART CATHETERIZATION WITH CORONARY ANGIOGRAM;  Surgeon: Peter M Martinique, MD;  [facility] LAB;  Service: Cardiovascular;  Laterality: Bilateral;  .  NEPHRECTOMY Left    Archie Endo 02/27/2005 (02/09/2013)  . TUBAL LIGATION    . UMBILICAL HERNIA REPAIR    . VITRECTOMY Right    Archie Endo 01/19/2002  (02/09/2013)     OB History   No obstetric history on file.      Home Medications    Prior to Admission medications   Medication Sig Start Date End Date Taking? Authorizing Provider  acetaminophen (TYLENOL) 325 MG tablet Take 650 mg by mouth daily.    [provider]  aspirin 81 MG EC tablet Take 1 tablet (81 mg total) by mouth  daily. Patient not taking: Reported on 05/22/2018 11/18/17   Welford Roche, MD  bisacodyl (DULCOLAX) 10 MG suppository Place 10 mg rectally daily as needed for moderate constipation.    [provider]  cephALEXin (KEFLEX) 250 MG/5ML suspension Take 10 mLs (500 mg total) by mouth 2 (two) times daily for 7 days. 05/27/18 06/03/18  Antonietta Breach, PA-C  loperamide (IMODIUM A-D) 2 MG tablet Take 2 mg by mouth as needed for diarrhea or loose stools.    [provider]  LORazepam (ATIVAN) 2 MG/ML concentrated solution Take 1 mg by mouth every 4 (four) hours as needed for anxiety. Apply 1 ml to wrist.    [provider]  magnesium hydroxide (MILK OF MAGNESIA) 400 MG/5ML suspension Take 30 mLs by mouth daily as needed for mild constipation.    [provider]  metoprolol succinate (TOPROL XL) 25 MG 24 hr tablet Take 0.5 tablets (12.5 mg total) by mouth daily. Patient not taking: Reported on 05/22/2018 11/18/17   Welford Roche, MD  nitroGLYCERIN (NITROSTAT) 0.4 MG SL tablet Place 1 tablet (0.4 mg total) under the tongue every 5 (five) minutes as needed for chest pain (up to 3 doses). 01/19/14   Dunn, Dayna N, PA-C  ondansetron (ZOFRAN-ODT) 4 MG disintegrating tablet Take 4 mg by mouth every 6 (six) hours as needed for nausea or vomiting.     [provider]  risperiDONE microspheres (RISPERDAL CONSTA) 25 MG injection Inject 25 mg into the muscle every 14 (fourteen) days.    [provider]  senna-docusate (SENNA S) 8.6-50 MG tablet Take 1-2 tablets by mouth 2 (two) times daily. Take 2 tablets every morning then take 1 tablet every evening    [provider]  sertraline (ZOLOFT) 100 MG tablet Take 100 mg by mouth daily.    [provider]  Sodium Phosphates (RA SALINE ENEMA) 19-7 GM/118ML ENEM Place 118 mLs rectally daily as needed (constipation).    [provider]    Family History Family History  Problem Relation  Age of Onset  . Cancer Mother   . Cancer Father   . Diabetes Son   . Diabetes Brother   . Stroke Son   . Stroke Daughter   . Heart attack Neg Hx     Social History Social History   Tobacco Use  . Smoking status: Former Smoker    Packs/day: 3.00    Years: 32.00    Pack years: 96.00    Types: Cigarettes    Last attempt to quit: 06/18/1973    Years since quitting: 44.9  . Smokeless tobacco: Never Used  Substance Use Topics  . Alcohol use: No    Comment: 02/09/2013 "haven't had a drink in 29 yr; used to have a beer once in awhile"  . Drug use: No     Allergies   Fluticasone-salmeterol and Statins   Review of Systems  Review of Systems  Unable to perform ROS: Mental status change     Physical Exam Updated Vital Signs BP 123/69 (BP Location: Left Arm)   Pulse 76   Temp 98.4 F (36.9 C) (Axillary)   Resp (!) 22   SpO2 100%   Physical Exam Vitals signs and nursing note reviewed.  Constitutional:      Comments: Unresponsive  HENT:     Head:     Comments: Some right periorbital contusion Mucous membranes appear dry    Nose: Nose normal.     Mouth/Throat:     Comments: Mucous membranes are dry Eyes:     Pupils: Pupils are equal, round, and reactive to light.     Comments: Pupils are small and equal  Neck:     Musculoskeletal: Normal range of motion.     Comments: Right anterior neck contusion purpleish in color Cardiovascular:     Rate and Rhythm: Normal rate and regular rhythm.     Pulses: Normal pulses.  Pulmonary:     Effort: Pulmonary effort is normal.  Abdominal:     General: Abdomen is flat.      ED Treatments / Results  Labs (all labs ordered are listed, but only abnormal results are displayed) Labs Reviewed  CBC WITH DIFFERENTIAL/PLATELET  COMPREHENSIVE METABOLIC PANEL  BLOOD GAS, VENOUS    EKG EKG Interpretation  Date/Time:  Sunday June 01 2018 13:37:13 EST Ventricular Rate:  74 PR Interval:    QRS Duration: 67 QT  Interval:  540 QTC Calculation: 600 R Axis:   58 Text Interpretation:  Normal sinus rhythm Non-specific ST-t changes No significant change since last tracing Confirmed by Pattricia Boss [phone]) on 06/01/2018 3:05:56 PM   Radiology Ct Head Wo Contrast  Result Date: 06/01/2018 CLINICAL DATA:  Altered mental status. Head trauma. EXAM: CT HEAD WITHOUT CONTRAST TECHNIQUE: Contiguous axial images were obtained from the base of the skull through the vertex without intravenous contrast. COMPARISON:  Multiple priors, most recent 05/22/2018. FINDINGS: Brain: No evidence for acute infarction, hemorrhage, mass lesion, or extra-axial fluid. Advanced atrophy. Extensive hypoattenuation of white matter representing small vessel disease. Hydrocephalus ex vacuo. Vascular: Calcification of the cavernous internal carotid arteries consistent with cerebrovascular atherosclerotic disease. No signs of intracranial large vessel occlusion. Skull: There is no skull fracture. There is a RIGHT frontal scalp hematoma, which was present previously and is only minimally smaller. Sinuses/Orbits: No acute findings. Chronic sphenoid sinus disease. BILATERAL cataract extraction. Other: None. IMPRESSION: 1. Atrophy and small vessel disease. No acute intracranial findings. 2. RIGHT frontal scalp hematoma without skull fracture or intracranial hemorrhage. Electronically Signed   By: Staci Righter M.D.   On: 06/01/2018 12:14   Ct Soft Tissue Neck Wo Contrast  Result Date: 06/01/2018 CLINICAL DATA:  Possible traumatic neck hematoma. EXAM: CT NECK WITHOUT CONTRAST TECHNIQUE: Multidetector CT imaging of the neck was performed following the standard protocol without intravenous contrast. COMPARISON:  CT head reported separately. FINDINGS: Pharynx and larynx: Normal. No mass or swelling. Salivary glands: No inflammation, mass, or stone. Thyroid: Unremarkable. Lymph nodes: None enlarged or abnormal density. Vascular: Atherosclerotic  calcifications, not unexpected for age. Vessel patency not established on this noncontrast exam. Limited intracranial: Reported separately. Visualized orbits: No acute findings. Mastoids and visualized paranasal sinuses: No layering fluid or significant opacity. Skeleton: Advanced cervical spondylosis. No destructive osseous lesion. Edentulous. Upper chest: No pneumothorax or mass. No layering fluid. Aortic atherosclerosis. Other: None. IMPRESSION: No acute findings in the neck. No significant hematoma  is observed. No concerning findings related to the airway. Electronically Signed   By: Staci Righter M.D.   On: 06/01/2018 12:12   Dg Chest Port 1 View  Result Date: 06/01/2018 CLINICAL DATA:  Altered mental status EXAM: PORTABLE CHEST 1 VIEW COMPARISON:  Portable exam 1109 hours compared to 05/27/2018 FINDINGS: Rotated to the LEFT. Normal heart size, mediastinal contours, and pulmonary vascularity. Atherosclerotic calcification aorta. Abnormal RIGHT perihilar/infrahilar opacity question residual atelectasis improved versus previous study though canal exclude perihilar mass and adenopathy. Skin folds project over RIGHT chest. Lungs otherwise emphysematous with central peribronchial thickening. Subsegmental atelectasis at lingula. No infiltrate, pleural effusion or pneumothorax. Bones demineralized. IMPRESSION: Suspected COPD changes with subsegmental atelectasis in lingula. Persistent RIGHT perihilar/infrahilar opacity and, could represent residual atelectasis though mass and adenopathy are not excluded; radiographic follow-up until resolution or CT imaging recommended to exclude adenopathy and mass. Electronically Signed   By: Lavonia Dana M.D.   On: 06/01/2018 11:32    Procedures Procedures (including critical care time)  Medications Ordered in ED Medications  sodium chloride 0.9 % bolus 1,000 mL (has no administration in time range)     Initial Impression / Assessment and Plan / ED Course  I have  reviewed the triage vital signs and the nursing notes.  Pertinent labs & imaging results that were available during my care of the patient were reviewed by me and considered in my medical decision making (see chart for details).   84 year old lady sent h\   ere from nursing home with reports that she is not as verbal as usual.  Here she has been irritable when I try to examine her which appears to be baseline from previous notes.  Old contusion to her head and neck.  These were re-CT done showed no evidence of acute fracture or abnormality.  Otherwise work-up is significant for urinary tract infection.  She was started on Keflex here 5 days ago when she was seen.  No urine culture was obtained at that time.  Today she continues have urinary tract infection.  Urine culture is sent.  She is given 2 g of Rocephin here.  This is likely the source.  She is given some IV fluid hydration here.  Her BUN is slightly elevated at 33.  She has been given p.o. fluids.  Is not febrile and has a normal white blood cell count.  Vitals:   06/01/18 1245 06/01/18 1335  BP: (!) 115/53 (!) 108/52  Pulse: 73 70  Resp: 17 17  Temp:    SpO2: 98% 99%     Discussed with Dasia (sp?) on call for [facility] Patient took po and thanked me for letting her sleep Plan d/c back to [facility] aware of need for iv abx and will d.w Dr. Jimmye Norman Urine cultured      Final Clinical Impressions(s) / ED Diagnoses   Final diagnoses:  Urinary tract infection with hematuria, site unspecified    ED Discharge Orders    None       Pattricia Boss, MD 06/01/18 1542

## 2018-06-01 NOTE — ED Triage Notes (Signed)
Patient arrived from [place] via [place] with reports that patient is usually boisterous but has not been like that since this morning.   On arrival, patient is alert to self, she yells at staff "[name]."  Patient has a  Purple-colored mark to the right side of neck-no one at the bedside to ask what happened. Patient is not cooperative she keeps saying "LEAVE ME ALONE."

## 2018-06-01 NOTE — ED Notes (Signed)
D/c sent to [facility] with [facility]

## 2018-06-01 NOTE — Discharge Instructions (Addendum)
Please call Dr. Jimmye Norman tomorrow for further antibiotic orders She was given rocephin in the ED for UTI and catheterized urine was sent for culture. Please push oral fluids frequently as patient is slightly dehydrated but has received iv fluids and was taking po fluids here when encouraged

## 2018-06-01 NOTE — ED Notes (Signed)
[facility] arrived to transport patient. 

## 2018-06-03 LAB — URINE CULTURE: Culture: >=100000 — AB

## 2018-06-04 ENCOUNTER — Telehealth: Payer: Self-pay | Admitting: Emergency Medicine

## 2018-06-04 NOTE — Telephone Encounter (Signed)
Post ED Visit - Positive Culture Follow-up  Culture report reviewed by antimicrobial stewardship pharmacist:  []  Elenor Quinones, Pharm.D. []  Heide Guile, Pharm.D., BCPS AQ-ID [x]  Parks Neptune, Pharm.D., BCPS []  Alycia Rossetti, Pharm.D., BCPS []  [place], Pharm.D., BCPS, AAHIVP []  Legrand Como, Pharm.D., BCPS, AAHIVP []  Salome Arnt, PharmD, BCPS []  Johnnette Gourd, PharmD, BCPS []  Hughes Better, PharmD, BCPS []  Leeroy Cha, PharmD  Positive urine culture Treated with keflex, organism sensitive to the same and no further patient follow-up is required at this time.  Hazle Nordmann 06/04/2018, 11:30 AM

## 2018-07-28 ENCOUNTER — Emergency Department (HOSPITAL_COMMUNITY): Payer: Medicare (Managed Care)

## 2018-07-28 ENCOUNTER — Other Ambulatory Visit: Payer: Self-pay

## 2018-07-28 ENCOUNTER — Inpatient Hospital Stay (HOSPITAL_COMMUNITY)
Admission: EM | Admit: 2018-07-28 | Discharge: 2018-08-02 | DRG: 853 | Disposition: A | Discharge status: Skilled Nursing Facility | Payer: Medicare (Managed Care) | Source: Skilled Nursing Facility | Attending: Internal Medicine | Admitting: Internal Medicine

## 2018-07-28 DIAGNOSIS — Z7189 Other specified counseling: Secondary | ICD-10-CM | POA: Diagnosis not present

## 2018-07-28 DIAGNOSIS — T17908A Unspecified foreign body in respiratory tract, part unspecified causing other injury, initial encounter: Secondary | ICD-10-CM | POA: Diagnosis not present

## 2018-07-28 DIAGNOSIS — K5900 Constipation, unspecified: Secondary | ICD-10-CM | POA: Diagnosis present

## 2018-07-28 DIAGNOSIS — N183 Chronic kidney disease, stage 3 (moderate): Secondary | ICD-10-CM | POA: Diagnosis present

## 2018-07-28 DIAGNOSIS — Z978 Presence of other specified devices: Secondary | ICD-10-CM

## 2018-07-28 DIAGNOSIS — Z823 Family history of stroke: Secondary | ICD-10-CM

## 2018-07-28 DIAGNOSIS — T17920A Food in respiratory tract, part unspecified causing asphyxiation, initial encounter: Secondary | ICD-10-CM | POA: Diagnosis present

## 2018-07-28 DIAGNOSIS — L89152 Pressure ulcer of sacral region, stage 2: Secondary | ICD-10-CM | POA: Diagnosis present

## 2018-07-28 DIAGNOSIS — R64 Cachexia: Secondary | ICD-10-CM | POA: Diagnosis present

## 2018-07-28 DIAGNOSIS — J690 Pneumonitis due to inhalation of food and vomit: Secondary | ICD-10-CM | POA: Diagnosis present

## 2018-07-28 DIAGNOSIS — Z955 Presence of coronary angioplasty implant and graft: Secondary | ICD-10-CM

## 2018-07-28 DIAGNOSIS — I2510 Atherosclerotic heart disease of native coronary artery without angina pectoris: Secondary | ICD-10-CM | POA: Diagnosis present

## 2018-07-28 DIAGNOSIS — J189 Pneumonia, unspecified organism: Secondary | ICD-10-CM

## 2018-07-28 DIAGNOSIS — Z515 Encounter for palliative care: Secondary | ICD-10-CM | POA: Diagnosis not present

## 2018-07-28 DIAGNOSIS — E785 Hyperlipidemia, unspecified: Secondary | ICD-10-CM | POA: Diagnosis present

## 2018-07-28 DIAGNOSIS — K219 Gastro-esophageal reflux disease without esophagitis: Secondary | ICD-10-CM | POA: Diagnosis present

## 2018-07-28 DIAGNOSIS — Z79899 Other long term (current) drug therapy: Secondary | ICD-10-CM

## 2018-07-28 DIAGNOSIS — Z66 Do not resuscitate: Secondary | ICD-10-CM | POA: Diagnosis not present

## 2018-07-28 DIAGNOSIS — E1122 Type 2 diabetes mellitus with diabetic chronic kidney disease: Secondary | ICD-10-CM | POA: Diagnosis present

## 2018-07-28 DIAGNOSIS — Z993 Dependence on wheelchair: Secondary | ICD-10-CM

## 2018-07-28 DIAGNOSIS — A419 Sepsis, unspecified organism: Secondary | ICD-10-CM | POA: Diagnosis present

## 2018-07-28 DIAGNOSIS — R0902 Hypoxemia: Secondary | ICD-10-CM

## 2018-07-28 DIAGNOSIS — T17820A Food in other parts of respiratory tract causing asphyxiation, initial encounter: Secondary | ICD-10-CM | POA: Diagnosis present

## 2018-07-28 DIAGNOSIS — X58XXXA Exposure to other specified factors, initial encounter: Secondary | ICD-10-CM | POA: Diagnosis present

## 2018-07-28 DIAGNOSIS — R6521 Severe sepsis with septic shock: Secondary | ICD-10-CM | POA: Diagnosis present

## 2018-07-28 DIAGNOSIS — J9601 Acute respiratory failure with hypoxia: Secondary | ICD-10-CM | POA: Diagnosis present

## 2018-07-28 DIAGNOSIS — J45909 Unspecified asthma, uncomplicated: Secondary | ICD-10-CM | POA: Diagnosis present

## 2018-07-28 DIAGNOSIS — Y92129 Unspecified place in nursing home as the place of occurrence of the external cause: Secondary | ICD-10-CM

## 2018-07-28 DIAGNOSIS — I129 Hypertensive chronic kidney disease with stage 1 through stage 4 chronic kidney disease, or unspecified chronic kidney disease: Secondary | ICD-10-CM | POA: Diagnosis present

## 2018-07-28 DIAGNOSIS — Z7982 Long term (current) use of aspirin: Secondary | ICD-10-CM

## 2018-07-28 DIAGNOSIS — R34 Anuria and oliguria: Secondary | ICD-10-CM | POA: Diagnosis present

## 2018-07-28 DIAGNOSIS — Z8673 Personal history of transient ischemic attack (TIA), and cerebral infarction without residual deficits: Secondary | ICD-10-CM

## 2018-07-28 DIAGNOSIS — Z87891 Personal history of nicotine dependence: Secondary | ICD-10-CM

## 2018-07-28 DIAGNOSIS — F0390 Unspecified dementia without behavioral disturbance: Secondary | ICD-10-CM

## 2018-07-28 DIAGNOSIS — H3530 Unspecified macular degeneration: Secondary | ICD-10-CM | POA: Diagnosis present

## 2018-07-28 DIAGNOSIS — Z6822 Body mass index (BMI) 22.0-22.9, adult: Secondary | ICD-10-CM

## 2018-07-28 DIAGNOSIS — R1310 Dysphagia, unspecified: Secondary | ICD-10-CM | POA: Diagnosis not present

## 2018-07-28 DIAGNOSIS — J90 Pleural effusion, not elsewhere classified: Secondary | ICD-10-CM | POA: Diagnosis not present

## 2018-07-28 DIAGNOSIS — E1165 Type 2 diabetes mellitus with hyperglycemia: Secondary | ICD-10-CM | POA: Diagnosis present

## 2018-07-28 DIAGNOSIS — H9190 Unspecified hearing loss, unspecified ear: Secondary | ICD-10-CM | POA: Diagnosis present

## 2018-07-28 DIAGNOSIS — L8990 Pressure ulcer of unspecified site, unspecified stage: Secondary | ICD-10-CM

## 2018-07-28 DIAGNOSIS — I252 Old myocardial infarction: Secondary | ICD-10-CM | POA: Diagnosis not present

## 2018-07-28 DIAGNOSIS — H409 Unspecified glaucoma: Secondary | ICD-10-CM | POA: Diagnosis present

## 2018-07-28 DIAGNOSIS — R4702 Dysphasia: Secondary | ICD-10-CM | POA: Diagnosis present

## 2018-07-28 DIAGNOSIS — L89302 Pressure ulcer of unspecified buttock, stage 2: Secondary | ICD-10-CM | POA: Diagnosis not present

## 2018-07-28 DIAGNOSIS — T17908S Unspecified foreign body in respiratory tract, part unspecified causing other injury, sequela: Secondary | ICD-10-CM | POA: Diagnosis not present

## 2018-07-28 DIAGNOSIS — Z905 Acquired absence of kidney: Secondary | ICD-10-CM

## 2018-07-28 DIAGNOSIS — Z7984 Long term (current) use of oral hypoglycemic drugs: Secondary | ICD-10-CM

## 2018-07-28 DIAGNOSIS — Z833 Family history of diabetes mellitus: Secondary | ICD-10-CM

## 2018-07-28 DIAGNOSIS — Z9181 History of falling: Secondary | ICD-10-CM

## 2018-07-28 DIAGNOSIS — E440 Moderate protein-calorie malnutrition: Secondary | ICD-10-CM | POA: Diagnosis not present

## 2018-07-28 DIAGNOSIS — F339 Major depressive disorder, recurrent, unspecified: Secondary | ICD-10-CM | POA: Diagnosis present

## 2018-07-28 LAB — GLUCOSE, CAPILLARY: Glucose-Capillary: 232 mg/dL — ABNORMAL HIGH (ref 70–99)

## 2018-07-28 LAB — COMPREHENSIVE METABOLIC PANEL
ALT: 12 U/L (ref 0–44)
AST: 23 U/L (ref 15–41)
Albumin: 2.9 g/dL — ABNORMAL LOW (ref 3.5–5.0)
Alkaline Phosphatase: 68 U/L (ref 38–126)
Anion gap: 11 (ref 5–15)
BUN: 20 mg/dL (ref 8–23)
CHLORIDE: 100 mmol/L (ref 98–111)
CO2: 26 mmol/L (ref 22–32)
Calcium: 9 mg/dL (ref 8.9–10.3)
Creatinine, Ser: 1.05 mg/dL — ABNORMAL HIGH (ref 0.44–1.00)
GFR calc Af Amer: 53 mL/min — ABNORMAL LOW (ref >60)
GFR calc non Af Amer: 45 mL/min — ABNORMAL LOW (ref >60)
Glucose, Bld: 309 mg/dL — ABNORMAL HIGH (ref 70–99)
POTASSIUM: 4.7 mmol/L (ref 3.5–5.1)
SODIUM: 137 mmol/L (ref 135–145)
Total Bilirubin: 0.4 mg/dL (ref 0.3–1.2)
Total Protein: 6.5 g/dL (ref 6.5–8.1)

## 2018-07-28 LAB — CBC
HCT: 42.7 % (ref 36.0–46.0)
Hemoglobin: 13.4 g/dL (ref 12.0–15.0)
MCH: 31.8 pg (ref 26.0–34.0)
MCHC: 31.4 g/dL (ref 30.0–36.0)
MCV: 101.4 fL — ABNORMAL HIGH (ref 80.0–100.0)
Platelets: 162 10*3/uL (ref 150–400)
RBC: 4.21 MIL/uL (ref 3.87–5.11)
RDW: 15.4 % (ref 11.5–15.5)
WBC: 7.8 10*3/uL (ref 4.0–10.5)
nRBC: 0 % (ref 0.0–0.2)

## 2018-07-28 LAB — MRSA PCR SCREENING: MRSA by PCR: NEGATIVE

## 2018-07-28 LAB — TRIGLYCERIDES: TRIGLYCERIDES: 134 mg/dL (ref <150)

## 2018-07-28 MED ORDER — HEPARIN SODIUM (PORCINE) 5000 UNIT/ML IJ SOLN
5000.0000 [IU] | Freq: Three times a day (TID) | INTRAMUSCULAR | Status: DC
  Administered 2018-07-28 – 2018-07-31 (×9): 5000 [IU] via SUBCUTANEOUS
  Filled 2018-07-28 (×8): qty 1

## 2018-07-28 MED ORDER — SODIUM CHLORIDE 0.9 % IV BOLUS: 500.0000 mL | Freq: Once | INTRAVENOUS | Status: DC

## 2018-07-28 MED ORDER — SODIUM CHLORIDE 0.9 % IV BOLUS
1000.0000 mL | Freq: Once | INTRAVENOUS | Status: AC
  Administered 2018-07-28: 1000 mL via INTRAVENOUS

## 2018-07-28 MED ORDER — ETOMIDATE 2 MG/ML IV SOLN
INTRAVENOUS | Status: AC | PRN
  Administered 2018-07-28: 20 mg via INTRAVENOUS

## 2018-07-28 MED ORDER — NOREPINEPHRINE 4 MG/250ML-% IV SOLN
INTRAVENOUS | Status: AC
  Administered 2018-07-28: 10 ug/min via INTRAVENOUS
  Filled 2018-07-28: qty 250

## 2018-07-28 MED ORDER — NOREPINEPHRINE 4 MG/250ML-% IV SOLN
0.0000 ug/min | INTRAVENOUS | Status: DC
  Administered 2018-07-28: 10 ug/min via INTRAVENOUS

## 2018-07-28 MED ORDER — FENTANYL CITRATE (PF) 100 MCG/2ML IJ SOLN
50.0000 ug | Freq: Once | INTRAMUSCULAR | Status: AC
  Administered 2018-07-28: 50 ug via INTRAVENOUS

## 2018-07-28 MED ORDER — PIPERACILLIN-TAZOBACTAM 3.375 G IVPB 30 MIN
3.3750 g | Freq: Once | INTRAVENOUS | Status: AC
  Administered 2018-07-28: 3.375 g via INTRAVENOUS
  Filled 2018-07-28: qty 50

## 2018-07-28 MED ORDER — PIPERACILLIN-TAZOBACTAM 3.375 G IVPB
3.3750 g | Freq: Three times a day (TID) | INTRAVENOUS | Status: DC
  Administered 2018-07-28 – 2018-07-29 (×2): 3.375 g via INTRAVENOUS
  Filled 2018-07-28 (×2): qty 50

## 2018-07-28 MED ORDER — PROPOFOL 1000 MG/100ML IV EMUL: 5.0000 ug/kg/min | INTRAVENOUS | Status: DC

## 2018-07-28 MED ORDER — PHENYLEPHRINE HCL-NACL 10-0.9 MG/250ML-% IV SOLN
0.0000 ug/min | INTRAVENOUS | Status: DC
  Administered 2018-07-28: 20 ug/min via INTRAVENOUS
  Administered 2018-07-29: 110 ug/min via INTRAVENOUS
  Administered 2018-07-29: 60 ug/min via INTRAVENOUS
  Administered 2018-07-29 (×2): 90 ug/min via INTRAVENOUS
  Administered 2018-07-29: 80 ug/min via INTRAVENOUS
  Administered 2018-07-29: 100 ug/min via INTRAVENOUS
  Filled 2018-07-28 (×3): qty 500
  Filled 2018-07-28: qty 250

## 2018-07-28 MED ORDER — PHENYLEPHRINE HCL-NACL 10-0.9 MG/250ML-% IV SOLN
INTRAVENOUS | Status: AC
  Administered 2018-07-28: 20 ug/min via INTRAVENOUS
  Filled 2018-07-28: qty 250

## 2018-07-28 MED ORDER — FENTANYL 2500MCG IN NS 250ML (10MCG/ML) PREMIX INFUSION
0.0000 ug/h | INTRAVENOUS | Status: DC
  Administered 2018-07-28: 25 ug/h via INTRAVENOUS
  Filled 2018-07-28: qty 250

## 2018-07-28 MED ORDER — FENTANYL CITRATE (PF) 100 MCG/2ML IJ SOLN
INTRAMUSCULAR | Status: AC
  Administered 2018-07-28: 50 ug via INTRAVENOUS
  Filled 2018-07-28: qty 2

## 2018-07-28 MED ORDER — ROCURONIUM BROMIDE 50 MG/5ML IV SOLN
INTRAVENOUS | Status: AC | PRN
  Administered 2018-07-28: 50 mg via INTRAVENOUS

## 2018-07-28 NOTE — ED Notes (Signed)
Attempted report 

## 2018-07-28 NOTE — ED Triage Notes (Signed)
Pt became choked on a grilled cheese sandwich & EMS arrived to pt at approx 1730 & removed some of the foreign object in route to ED.

## 2018-07-28 NOTE — Progress Notes (Signed)
Pharmacy Antibiotic Note  [name] is a 85 y.o. female admitted on 07/28/2018 with pneumonia.  Pharmacy has been consulted for Zosyn dosing.  Plan: Start Zosyn 3.375 gm IV q8h (4 hour infusion) Monitor clinical picture, renal function F/U C&S, abx deescalation / LOT   Height: 5\' 1"  (154.9 cm) Weight: 121 lb 4.1 oz (55 kg) IBW/kg (Calculated) : 47.8  No data recorded.  Recent Labs  Lab 07/28/18 1810 07/28/18 1828  WBC 7.8  --   CREATININE  --  1.05*    Estimated Creatinine Clearance: 24.7 mL/min (A) (by C-G formula based on SCr of 1.05 mg/dL (H)).    Allergies  Allergen Reactions  . Fluticasone-Salmeterol Other (See Comments)    Per MAR  . Statins Other (See Comments)    Per MAR  . Sulfa Antibiotics Other (See Comments)    Per MAR    Thank you for allowing pharmacy to be a part of this patient's care.  Reginia Naas 07/28/2018 7:32 PM

## 2018-07-28 NOTE — Progress Notes (Signed)
[facility] Progress Note Patient Name: [name] DOB: 08-09-1921 MRN: [id number]   Date of Service  07/28/2018  HPI/Events of Note  Hypotension - BP = 68/42. No CVL or CVP.  eICU Interventions  Will order: 1. Phenylephrine IV infusion. Titrate to MAP >= 65.     Intervention Category Major Interventions: Hypotension - evaluation and management  Sommer,Steven Eugene 07/28/2018, 11:24 PM

## 2018-07-28 NOTE — ED Provider Notes (Signed)
[facility] EMERGENCY DEPARTMENT Provider Note   CSN: [id number] Arrival date & time: 07/28/18  1803     History   Chief Complaint Chief Complaint  Patient presents with  . Choking    HPI [name] is a 85 y.o. female.  The history is provided by the EMS personnel, medical records and a relative. No language interpreter was used.   [name] is a 85 y.o. female who presents to the Emergency Department complaining of choking. Level five caveat due to unresponsiveness. Patient presents by EMS following a choking episode. Per reports she was eating a grilled cheese sandwich when she choked and became unresponsive. On EMS arrival she was unresponsive. They use magill forceps to remove food from her mouth and then endotracheal intubation was performed with a 6-0 tube. She was transferred emergently to the emergency department for further evaluation. Past Medical History:  Diagnosis Date  . Anemia    a. Mild, noted on bloodwork 12/2013.  Marland Kitchen Arthritis    "all over" (02/09/2013)  . Asthma   . Blood transfusion reaction   . CAD (coronary artery disease)    a. prior hx of RCA stent in 1996. b. 12/2013: inferior STEMI s/p extensive PTCA only of occluded RCA (no stents); distal LAD and OM were chronically occluded, EF 55-60%.  . Cardiogenic shock ([place])    a. 12/2013: during STEMI with SBP 60s, AV block/bradycardia, resolved with reperfusion.  . CKD (chronic kidney disease), stage III ([place])    a. "one kidney rotted" (02/09/2013). (?) b. likely stage III by historical bloodwork.  . Depression   . Falls frequently   . GERD (gastroesophageal reflux disease)   . Glaucoma, right eye   . Hernia   . HTN (hypertension)   . Hyperlipidemia   . Mild dementia ([place])   . Seasonal allergies   . Skin cancer of nose    "left side" (02/09/2013)  . Stroke [facility]) 1960's   "a light one" (02/09/2013)  . Thrombocytopenia ([place])    a. Mild, noted on bloodwork 12/2013.  . Type II diabetes  mellitus ([place])   . Vertigo     Patient Active Problem List   Diagnosis Date Noted  . Aspiration of foreign body in respiratory tract 07/28/2018  . Bacteriuria with Klebsiella pneumoniae 11/17/2017  . DCM (dilated cardiomyopathy) ([place])   . NSTEMI (non-ST elevated myocardial infarction) ([place]) 11/13/2017  . Bruising 08/18/2014  . HOH (hard of hearing) 08/18/2014  . CAD (coronary artery disease)   . CKD (chronic kidney disease), stage III ([place])   . Mild dementia ([place])   . Thrombocytopenia ([place])   . Anemia   . Cardiogenic shock ([place])   . ST elevation myocardial infarction (STEMI) involving right coronary artery with complication ([place]) 09/32/3557  . Fall 02/09/2013  . DYSPNEA ON EXERTION 07/07/2009  . URINARY INCONTINENCE 07/07/2009  . Essential hypertension, benign 04/12/2009  . VERTIGO 11/22/2008  . CONSTIPATION 03/31/2007  . DIABETES MELLITUS, II, COMPLICATIONS 32/20/2542  . HYPERCHOLESTEROLEMIA 08/15/2006  . OBESITY, NOS 08/15/2006  . DEPRESSION, MAJOR, RECURRENT 08/15/2006  . MACULAR DEGENERATION 08/15/2006  . MYOCARDIAL INFARCTION, OLD 08/15/2006  . CLAUDICATION, INTERMITTENT 08/15/2006  . RHINITIS, ALLERGIC 08/15/2006  . GASTROESOPHAGEAL REFLUX, NO ESOPHAGITIS 08/15/2006  . OSTEOARTHRITIS OF SPINE, NOS 08/15/2006    Past Surgical History:  Procedure Laterality Date  . APPENDECTOMY    . CARDIAC CATHETERIZATION  2005   Archie Endo 05/07/2006 (02/09/2013)  . CHOLECYSTECTOMY  02/2005   Archie Endo 02/28/2005 (02/09/2013)  .  CORONARY ANGIOPLASTY WITH STENT PLACEMENT    . EYE SURGERY Left   . LEFT HEART CATHETERIZATION WITH CORONARY ANGIOGRAM Bilateral 01/15/2014   Procedure: LEFT HEART CATHETERIZATION WITH CORONARY ANGIOGRAM;  Surgeon: Peter M Martinique, MD;  Location: [facility] CATH LAB;  Service: Cardiovascular;  Laterality: Bilateral;  . NEPHRECTOMY Left    Archie Endo 02/27/2005 (02/09/2013)  . TUBAL LIGATION    . UMBILICAL HERNIA REPAIR    . VITRECTOMY Right    Archie Endo 01/19/2002  (02/09/2013)      OB History   No obstetric history on file.      Home Medications    Prior to Admission medications   Medication Sig Start Date End Date Taking? Authorizing Provider  acetaminophen (TYLENOL) 325 MG tablet Take 650 mg by mouth daily.    [provider]  aspirin 81 MG EC tablet Take 1 tablet (81 mg total) by mouth daily. 11/18/17   Santos-Sanchez, Merlene Morse, MD  bisacodyl (DULCOLAX) 10 MG suppository Place 10 mg rectally daily as needed for moderate constipation.    [provider]  divalproex (DEPAKOTE SPRINKLE) 125 MG capsule Take 125 mg by mouth daily at 12 noon.     [provider]  glipiZIDE (GLUCOTROL XL) 2.5 MG 24 hr tablet Take 2.5 mg by mouth daily with breakfast.    [provider]  loperamide (IMODIUM A-D) 2 MG tablet Take 2 mg by mouth as needed for diarrhea or loose stools.    [provider]  LORazepam (ATIVAN) 0.5 MG tablet Take 0.5 mg by mouth every 6 (six) hours as needed for anxiety.    [provider]  LORazepam (ATIVAN) 2 MG/ML concentrated solution Take 1 mg by mouth every 4 (four) hours as needed for anxiety. Apply 1 ml to wrist.    [provider]  magnesium hydroxide (MILK OF MAGNESIA) 400 MG/5ML suspension Take 30 mLs by mouth daily as needed for mild constipation.    [provider]  metoprolol succinate (TOPROL XL) 25 MG 24 hr tablet Take 0.5 tablets (12.5 mg total) by mouth daily. 11/18/17   Santos-Sanchez, Merlene Morse, MD  nitroGLYCERIN (NITROSTAT) 0.4 MG SL tablet Place 1 tablet (0.4 mg total) under the tongue every 5 (five) minutes as needed for chest pain (up to 3 doses). 01/19/14   Dunn, Dayna N, PA-C  ondansetron (ZOFRAN-ODT) 4 MG disintegrating tablet Take 4 mg by mouth every 6 (six) hours as needed for nausea or vomiting.     [provider]  ranitidine (ZANTAC) 150 MG tablet Take 150 mg by mouth 2 (two) times daily as needed for heartburn.    [provider]  risperiDONE  microspheres (RISPERDAL CONSTA) 25 MG injection Inject 25 mg into the muscle every 14 (fourteen) days.    [provider]  senna-docusate (SENNA S) 8.6-50 MG tablet Take 1-2 tablets by mouth 2 (two) times daily. Take 2 tablets every morning then take 1 tablet every evening    [provider]  sertraline (ZOLOFT) 100 MG tablet Take 100 mg by mouth daily.    [provider]  Sodium Phosphates (RA SALINE ENEMA) 19-7 GM/118ML ENEM Place 118 mLs rectally daily as needed (constipation).    [provider]    Family History Family History  Problem Relation Age of Onset  . Cancer Mother   . Cancer Father   . Diabetes Son   . Diabetes Brother   . Stroke Son   . Stroke Daughter   . Heart attack Neg Hx  Social History Social History   Tobacco Use  . Smoking status: Former Smoker    Packs/day: 3.00    Years: 32.00    Pack years: 96.00    Types: Cigarettes    Last attempt to quit: 06/18/1973    Years since quitting: 45.1  . Smokeless tobacco: Never Used  Substance Use Topics  . Alcohol use: No    Comment: 02/09/2013 "haven't had a drink in 29 yr; used to have a beer once in awhile"  . Drug use: No     Allergies   Fluticasone-salmeterol; Statins; and Sulfa antibiotics   Review of Systems Review of Systems  Unable to perform ROS: Patient unresponsive     Physical Exam Updated Vital Signs BP 102/67   Pulse 91   Resp 16   Ht 5\' 1"  (1.549 m)   Wt 55 kg   SpO2 100%   BMI 22.91 kg/m   Physical Exam Vitals signs and nursing note reviewed.  Constitutional:      General: She is in acute distress.     Appearance: She is well-developed. She is ill-appearing.  HENT:     Head: Normocephalic and atraumatic.  Cardiovascular:     Rate and Rhythm: Regular rhythm.     Heart sounds: No murmur.     Comments: Tachycardic Pulmonary:     Comments: 6-0  Tube in OP.  Absent right sided breath sounds, wheezing left sided breath sounds.   Abdominal:       Palpations: Abdomen is soft.     Tenderness: There is no abdominal tenderness. There is no guarding or rebound.  Musculoskeletal:        General: No swelling or tenderness.  Skin:    General: Skin is warm and dry.  Neurological:     Comments: Unresponsive.  G[place]ace, reaches for ETT with BUE.    Psychiatric:     Comments: Unable to assess      ED Treatments / Results  Labs (all labs ordered are listed, but only abnormal results are displayed) Labs Reviewed  CBC - Abnormal; Notable for the following components:      Result Value   MCV 101.4 (*)    All other components within normal limits  TRIGLYCERIDES  COMPREHENSIVE METABOLIC PANEL  I-STAT ARTERIAL BLOOD GAS, ED    EKG None  Radiology Dg Chest Portable 1 View  Result Date: 07/28/2018 CLINICAL DATA:  Intubated patient EXAM: PORTABLE CHEST 1 VIEW COMPARISON:  06/01/2018, 05/27/2018 FINDINGS: Endotracheal tube tip just above the carina. Volume loss on the right with right upper lobe atelectasis. Left lung is clear. Heart size is normal IMPRESSION: 1. Endotracheal tube tip just above the carina 2. Volume loss in the right hemithorax with right upper lobe collapse/atelectasis. Electronically Signed   By: Donavan Foil M.D.   On: 07/28/2018 18:49    Procedures Procedure Name: Intubation Date/Time: 07/28/2018 6:57 PM Performed by: Quintella Reichert, MD Pre-anesthesia Checklist: Patient identified, Patient being monitored, Emergency Drugs available, Timeout performed and Suction available Oxygen Delivery Method: Ambu bag Preoxygenation: Pre-oxygenation with 100% oxygen Induction Type: Rapid sequence Laryngoscope Size: Glidescope Tube size: 7.5 mm Number of attempts: 1 Placement Confirmation: ETT inserted through vocal cords under direct vision,  CO2 detector and Positive ETCO2 Secured at: 24 cm Tube secured with: ETT holder Dental Injury: Teeth and Oropharynx as per pre-operative assessment  Difficulty Due To: Difficulty  was anticipated Comments: Bougie was placed through 6.0 tube that was placed prior to ED arrival. Glide scope  was used to visualize the cords and the 6.0 tube was removed. There was a lot of debris over the vocal cords and the area was suctioned and a 7.5 endotracheal tube was passed over the bougie and into the trachea.      (including critical care time) CRITICAL CARE Performed by: Quintella Reichert   Total critical care time: 65 minutes  Critical care time was exclusive of separately billable procedures and treating other patients.  Critical care was necessary to treat or prevent imminent or life-threatening deterioration.  Critical care was time spent personally by me on the following activities: development of treatment plan with patient and/or surrogate as well as nursing, discussions with consultants, evaluation of patient's response to treatment, examination of patient, obtaining history from patient or surrogate, ordering and performing treatments and interventions, ordering and review of laboratory studies, ordering and review of radiographic studies, pulse oximetry and re-evaluation of patient's condition.  Medications Ordered in ED Medications  propofol (DIPRIVAN) 1000 MG/100ML infusion (0 mcg/kg/min  55 kg Intravenous Hold 07/28/18 1837)  norepinephrine (LEVOPHED) 4mg  in 221mL premix infusion (0 mcg/min Intravenous Stopped 07/28/18 1829)  fentaNYL 2548mcg in NS 290mL (84mcg/ml) infusion-PREMIX (25 mcg/hr Intravenous New Bag/Given 07/28/18 1837)  piperacillin-tazobactam (ZOSYN) IVPB 3.375 g (has no administration in time range)  etomidate (AMIDATE) injection (20 mg Intravenous Given 07/28/18 1811)  rocuronium (ZEMURON) injection (50 mg Intravenous Given 07/28/18 1812)  fentaNYL (SUBLIMAZE) injection 50 mcg (50 mcg Intravenous Given 07/28/18 1830)     Initial Impression / Assessment and Plan / ED Course  I have reviewed the triage vital signs and the nursing notes.  Pertinent  labs & imaging results that were available during my care of the patient were reviewed by me and considered in my medical decision making (see chart for details).     Patient presented to the emergency department after choking on a sandwich. On ED arrival patient had an endotracheal tube in place with absent air movement over the right lung fields. She had oxygen saturations in the 40s to 60s and bagging was difficult. The ETT was exchanged for a larger ET tube and suctioning was performed around her records. Critical care was consulted and bronchoscopy was performed by critical care physician. She did develop transient hypotension and was treated with fluid bolus and processors. She was started on zosyn for aspiration. Patient's daughter updated of findings and critical nature of illness with plan to admit for further management.  Final Clinical Impressions(s) / ED Diagnoses   Final diagnoses:  None    ED Discharge Orders    None       Quintella Reichert, MD 07/29/18 [id number]

## 2018-07-28 NOTE — Procedures (Signed)
Bronchoscopy Procedure Note [name] [id number] Oct 24, 1921  Procedure: Bronchoscopy Indications: Diagnostic evaluation of the airways and removal of foreign body  Procedure Details Consent: Unable to obtain consent because of emergent medical necessity. Time Out: Verified patient identification, verified procedure, site/side was marked, verified correct patient position, special equipment/implants available, medications/allergies/relevent history reviewed, required imaging and test results available.  Performed  In preparation for procedure, patient was given 100% FiO2 and bronchoscope lubricated. Sedation: Propofol  Airway entered and the following bronchi were examined: RUL, RML, RLL, LUL and LLL.  Large and small pieces of debris visualized in RML, RLL and LLL that were suctioned and cleared from airways. No visible foreign objects seen prior to removal of scope. Procedures performed: None  Evaluation Hemodynamic Status: Hemodynamically unstable on levophed prior to start procedure; O2 sats: SpO2 in 70% which mproved s/p bronchoscopy to 100% on FIO2 100% Patient's Current Condition: unstable Specimens:  None Complications: No apparent complications Patient did tolerate procedure well.   Robben Jagiello Rodman Pickle 07/28/2018

## 2018-07-28 NOTE — Consult Note (Signed)
[name], MRN:  [id number], DOB:  [birth year], LOS: 0 ADMISSION DATE:  07/28/2018, CONSULTATION DATE:  07/28/18 REFERRING MD:  Pattricia Boss, MD CHIEF COMPLAINT:  Choking  Brief History   85 year old female SNF resident admitted after choking on grilled cheese sandwich. Arrived intubated with BVM. On arrival, patient hypoxemic and hypotensive. Pulmonary consulted in ED and emergent bronchoscopy performed with food retrieval. S/p bronch hypoxemia improved.  History of present illness   85 year old female SNF resident with hx dysphagia admitted after choking on grilled cheese sandwich. Arrived intubated with BVM. On arrival, patient hypoxemic and hypotensive. Pulmonary consulted in ED and emergent bronchoscopy performed with food retrieval. S/p bronch hypoxemia improved.  Past Medical History  Dementia, asthma, CKD, CAD s.p stent in 1996, HTN, DM2, GERD  Significant Hospital Events   2/10> Admitted and intubated  Consults:  PCCM  Procedures:  ETT 2/10>  Significant Diagnostic Tests:  Bronchoscopy 2/10> Food debris in airways bilaterally. Suctioned and removed as much material as possible. Prior to removal of scope, no foreign material visualized main, segmental and subsegmental airways.  Micro Data:  Trach Asp 2/10>  Antimicrobials:  Zosyn 2/10>  Interim history/subjective:    Objective   Blood pressure 102/67, pulse 91, resp. rate 16, height 5\' 1"  (1.549 m), weight 55 kg, SpO2 100 %.    Vent Mode: PRVC FiO2 (%):  [100 %] 100 % Set Rate:  [16 bmp] 16 bmp Vt Set:  [380 mL] 380 mL PEEP:  [5 cmH20] 5 cmH20  No intake or output data in the 24 hours ending 07/28/18 1901 Filed Weights   07/28/18 1810  Weight: 55 kg    Physical Exam: General: Frail appearing female, laying in bed, intubated and sedated HENT: [place], AT, ETT in place Eyes: EOMI, no scleral icterus Respiratory: Diffuse rhonchi bilaterally Cardiovascular: RRR, -M/R/G, no JVD GI: BS+, soft,  nontender Extremities:-Edema,-tenderness Neuro: Sedated, unable to follow commands Skin: Intact, no rashes or bruising Psych: Unable to assess  Resolved Hospital Problem list     Assessment & Plan:  Acute hypoxemic respiratory failure secondary to foreign body S/p bronchoscopy with food retrieval -Full vent support -Zosyn -Trach aspirate -ABG -CXR in am -ICU monitoring  Septic Shock secondary to aspiration pneumonitis -Continue pressor support to maintain MAPS >65 -Continue antibiotics   Best practice:  Diet: NPO Pain/Anxiety/Delirium protocol (if indicated): If indicated VAP protocol (if indicated): Yes DVT prophylaxis: SCDs GI prophylaxis: PPI Glucose control: CBG Mobility: BR Code Status: DNR. Daughter agrees with continuing care however no chest compressions or shock Family Communication: Daughter at bedside Disposition: Admit to ICU  Labs   CBC: Recent Labs  Lab 07/28/18 1810  WBC 7.8  HGB 13.4  HCT 42.7  MCV 101.4*  PLT 893    Basic Metabolic Panel: No results for input(s): NA, K, CL, CO2, GLUCOSE, BUN, CREATININE, CALCIUM, MG, PHOS in the last 168 hours. GFR: CrCl cannot be calculated (Patient's most recent lab result is older than the maximum 21 days allowed.). Recent Labs  Lab 07/28/18 1810  WBC 7.8    Liver Function Tests: No results for input(s): AST, ALT, ALKPHOS, BILITOT, PROT, ALBUMIN in the last 168 hours. No results for input(s): LIPASE, AMYLASE in the last 168 hours. No results for input(s): AMMONIA in the last 168 hours.  ABG    Component Value Date/Time   HCO3 29.0 (H) 06/01/2018 1107   TCO2 30 06/01/2018 1107   O2SAT 62.0 06/01/2018 1107  Coagulation Profile: No results for input(s): INR, PROTIME in the last 168 hours.  Cardiac Enzymes: No results for input(s): CKTOTAL, CKMB, CKMBINDEX, TROPONINI in the last 168 hours.  HbA1C: Hgb A1c MFr Bld  Date/Time Value Ref Range Status  02/09/2013 05:02 PM 9.3 (H) <5.7 %  Final    Comment:    (NOTE)                                                                       According to the ADA Clinical Practice Recommendations for 2011, when HbA1c is used as a screening test:  >=6.5%   Diagnostic of Diabetes Mellitus           (if abnormal result is confirmed) 5.7-6.4%   Increased risk of developing Diabetes Mellitus References:Diagnosis and Classification of Diabetes Mellitus,Diabetes Care,2011,34(Suppl 1):S62-S69 and Standards of Medical Care in         Diabetes - 2011,Diabetes PPIR,5188,41 (Suppl 1):S11-S61.  11/28/2010 03:58 AM 8.4 (H) <5.7 % Final    Comment:    (NOTE)                                                                       According to the ADA Clinical Practice Recommendations for 2011, when HbA1c is used as a screening test:  >=6.5%   Diagnostic of Diabetes Mellitus           (if abnormal result is confirmed) 5.7-6.4%   Increased risk of developing Diabetes Mellitus References:Diagnosis and Classification of Diabetes Mellitus,Diabetes YSAY,3016,01(UXNAT 1):S62-S69 and Standards of Medical Care in         Diabetes - 2011,Diabetes Care,2011,34 (Suppl 1):S11-S61.    CBG: No results for input(s): GLUCAP in the last 168 hours.  Review of Systems:   Unable to obtain due to altered mental status  Past Medical History  She,  has a past medical history of Anemia, Arthritis, Asthma, Blood transfusion reaction, CAD (coronary artery disease), Cardiogenic shock ([place]), CKD (chronic kidney disease), stage III ([employer]), Depression, Falls frequently, GERD (gastroesophageal reflux disease), Glaucoma, right eye, Hernia, HTN (hypertension), Hyperlipidemia, Mild dementia ([place]), Seasonal allergies, Skin cancer of nose, Stroke ([place]) (1960's), Thrombocytopenia ([place]), Type II diabetes mellitus ([place]), and Vertigo.   Surgical History    Past Surgical History:  Procedure Laterality Date  . APPENDECTOMY    . CARDIAC CATHETERIZATION  2005   Archie Endo 05/07/2006  (02/09/2013)  . CHOLECYSTECTOMY  02/2005   Archie Endo 02/28/2005 (02/09/2013)  . CORONARY ANGIOPLASTY WITH STENT PLACEMENT    . EYE SURGERY Left   . LEFT HEART CATHETERIZATION WITH CORONARY ANGIOGRAM Bilateral 01/15/2014   Procedure: LEFT HEART CATHETERIZATION WITH CORONARY ANGIOGRAM;  Surgeon: Peter M Martinique, MD;  Location[facility] CATH LAB;  Service: Cardiovascular;  Laterality: Bilateral;  . NEPHRECTOMY Left    Archie Endo 02/27/2005 (02/09/2013)  . TUBAL LIGATION    . UMBILICAL HERNIA REPAIR    . VITRECTOMY Right    Archie Endo 01/19/2002  (02/09/2013)     Social History  reports that she quit smoking about 45 years ago. Her smoking use included cigarettes. She has a 96.00 pack-year smoking history. She has never used smokeless tobacco. She reports that she does not drink alcohol or use drugs.   Family History   Her family history includes Cancer in her father and mother; Diabetes in her brother and son; Stroke in her daughter and son. There is no history of Heart attack.   Allergies Allergies  Allergen Reactions  . Fluticasone-Salmeterol Other (See Comments)    Per MAR  . Statins Other (See Comments)    Per MAR  . Sulfa Antibiotics Other (See Comments)    Per MAR     Home Medications  Prior to Admission medications   Medication Sig Start Date End Date Taking? Authorizing Provider  acetaminophen (TYLENOL) 325 MG tablet Take 650 mg by mouth daily.    [provider]  aspirin 81 MG EC tablet Take 1 tablet (81 mg total) by mouth daily. 11/18/17   Santos-Sanchez, Merlene Morse, MD  bisacodyl (DULCOLAX) 10 MG suppository Place 10 mg rectally daily as needed for moderate constipation.    [provider]  divalproex (DEPAKOTE SPRINKLE) 125 MG capsule Take 125 mg by mouth daily at 12 noon.     [provider]  glipiZIDE (GLUCOTROL XL) 2.5 MG 24 hr tablet Take 2.5 mg by mouth daily with breakfast.    [provider]  loperamide (IMODIUM A-D) 2 MG tablet Take 2 mg by mouth as needed  for diarrhea or loose stools.    [provider]  LORazepam (ATIVAN) 0.5 MG tablet Take 0.5 mg by mouth every 6 (six) hours as needed for anxiety.    [provider]  LORazepam (ATIVAN) 2 MG/ML concentrated solution Take 1 mg by mouth every 4 (four) hours as needed for anxiety. Apply 1 ml to wrist.    [provider]  magnesium hydroxide (MILK OF MAGNESIA) 400 MG/5ML suspension Take 30 mLs by mouth daily as needed for mild constipation.    [provider]  metoprolol succinate (TOPROL XL) 25 MG 24 hr tablet Take 0.5 tablets (12.5 mg total) by mouth daily. 11/18/17   Santos-Sanchez, Merlene Morse, MD  nitroGLYCERIN (NITROSTAT) 0.4 MG SL tablet Place 1 tablet (0.4 mg total) under the tongue every 5 (five) minutes as needed for chest pain (up to 3 doses). 01/19/14   Dunn, Dayna N, PA-C  ondansetron (ZOFRAN-ODT) 4 MG disintegrating tablet Take 4 mg by mouth every 6 (six) hours as needed for nausea or vomiting.     [provider]  ranitidine (ZANTAC) 150 MG tablet Take 150 mg by mouth 2 (two) times daily as needed for heartburn.    [provider]  risperiDONE microspheres (RISPERDAL CONSTA) 25 MG injection Inject 25 mg into the muscle every 14 (fourteen) days.    [provider]  senna-docusate (SENNA S) 8.6-50 MG tablet Take 1-2 tablets by mouth 2 (two) times daily. Take 2 tablets every morning then take 1 tablet every evening    [provider]  sertraline (ZOLOFT) 100 MG tablet Take 100 mg by mouth daily.    [provider]  Sodium Phosphates (RA SALINE ENEMA) 19-7 GM/118ML ENEM Place 118 mLs rectally daily as needed (constipation).    [provider]     Critical care time: 24    The patient is critically ill with multiple organ systems failure and requires high complexity decision making for assessment and support, frequent evaluation and titration of  therapies, application of advanced monitoring technologies and  extensive interpretation of multiple databases.   Critical Care Time devoted to patient care services described in this note is 60 Minutes. This time reflects time of care of this signee Dr. Rodman Pickle. This critical care time does not reflect procedure time, or teaching time or supervisory time of PA/NP/Med student/Med Resident etc but could involve care discussion time.  Rodman Pickle, M.D. [facility] Pulmonary/Critical Care Medicine Pager: [phone] After hours pager: [phone]

## 2018-07-29 ENCOUNTER — Inpatient Hospital Stay (HOSPITAL_COMMUNITY): Payer: Medicare (Managed Care)

## 2018-07-29 DIAGNOSIS — T17908S Unspecified foreign body in respiratory tract, part unspecified causing other injury, sequela: Secondary | ICD-10-CM

## 2018-07-29 DIAGNOSIS — L8990 Pressure ulcer of unspecified site, unspecified stage: Secondary | ICD-10-CM

## 2018-07-29 LAB — CBC
HCT: 38.8 % (ref 36.0–46.0)
Hemoglobin: 12.2 g/dL (ref 12.0–15.0)
MCH: 31.5 pg (ref 26.0–34.0)
MCHC: 31.4 g/dL (ref 30.0–36.0)
MCV: 100.3 fL — ABNORMAL HIGH (ref 80.0–100.0)
Platelets: 144 10*3/uL — ABNORMAL LOW (ref 150–400)
RBC: 3.87 MIL/uL (ref 3.87–5.11)
RDW: 15.4 % (ref 11.5–15.5)
WBC: 20 10*3/uL — ABNORMAL HIGH (ref 4.0–10.5)
nRBC: 0 % (ref 0.0–0.2)

## 2018-07-29 LAB — BASIC METABOLIC PANEL
Anion gap: 9 (ref 5–15)
BUN: 19 mg/dL (ref 8–23)
CALCIUM: 8.2 mg/dL — AB (ref 8.9–10.3)
CO2: 24 mmol/L (ref 22–32)
Chloride: 108 mmol/L (ref 98–111)
Creatinine, Ser: 1.08 mg/dL — ABNORMAL HIGH (ref 0.44–1.00)
GFR calc non Af Amer: 44 mL/min — ABNORMAL LOW (ref >60)
GFR, EST AFRICAN AMERICAN: 51 mL/min — AB (ref >60)
Glucose, Bld: 244 mg/dL — ABNORMAL HIGH (ref 70–99)
Potassium: 4.6 mmol/L (ref 3.5–5.1)
Sodium: 141 mmol/L (ref 135–145)

## 2018-07-29 LAB — POCT I-STAT 7, (LYTES, BLD GAS, ICA,H+H)
Acid-Base Excess: 1 mmol/L (ref 0.0–2.0)
Bicarbonate: 27.5 mmol/L (ref 20.0–28.0)
Calcium, Ion: 1.22 mmol/L (ref 1.15–1.40)
HCT: 39 % (ref 36.0–46.0)
Hemoglobin: 13.3 g/dL (ref 12.0–15.0)
O2 Saturation: 98 %
PO2 ART: 119 mmHg — AB (ref 83.0–108.0)
Patient temperature: 98.6
Potassium: 3.9 mmol/L (ref 3.5–5.1)
Sodium: 139 mmol/L (ref 135–145)
TCO2: 29 mmol/L (ref 22–32)
pCO2 arterial: 51.7 mmHg — ABNORMAL HIGH (ref 32.0–48.0)
pH, Arterial: 7.334 — ABNORMAL LOW (ref 7.350–7.450)

## 2018-07-29 LAB — GLUCOSE, CAPILLARY
GLUCOSE-CAPILLARY: 127 mg/dL — AB (ref 70–99)
Glucose-Capillary: 190 mg/dL — ABNORMAL HIGH (ref 70–99)
Glucose-Capillary: 74 mg/dL (ref 70–99)
Glucose-Capillary: 80 mg/dL (ref 70–99)

## 2018-07-29 LAB — MAGNESIUM: Magnesium: 1.3 mg/dL — ABNORMAL LOW (ref 1.7–2.4)

## 2018-07-29 LAB — PHOSPHORUS: Phosphorus: 3 mg/dL (ref 2.5–4.6)

## 2018-07-29 MED ORDER — SODIUM CHLORIDE 0.9 % IV SOLN
INTRAVENOUS | Status: DC | PRN
  Administered 2018-07-28: 22:00:00 via INTRAVENOUS

## 2018-07-29 MED ORDER — MAGNESIUM SULFATE 2 GM/50ML IV SOLN
2.0000 g | Freq: Once | INTRAVENOUS | Status: AC
  Administered 2018-07-29: 2 g via INTRAVENOUS
  Filled 2018-07-29: qty 50

## 2018-07-29 MED ORDER — PHENYLEPHRINE HCL-NACL 10-0.9 MG/250ML-% IV SOLN
25.0000 ug/min | INTRAVENOUS | Status: DC
  Administered 2018-07-29: 50 ug/min via INTRAVENOUS
  Administered 2018-07-29: 40 ug/min via INTRAVENOUS
  Administered 2018-07-30: 50 ug/min via INTRAVENOUS
  Administered 2018-07-30 (×2): 40 ug/min via INTRAVENOUS
  Filled 2018-07-29 (×2): qty 250
  Filled 2018-07-29: qty 500

## 2018-07-29 MED ORDER — FENTANYL CITRATE (PF) 100 MCG/2ML IJ SOLN: 50.0000 ug | INTRAMUSCULAR | Status: DC | PRN

## 2018-07-29 MED ORDER — SODIUM CHLORIDE 0.9 % IV SOLN
INTRAVENOUS | Status: DC | PRN
  Administered 2018-07-29 – 2018-07-31 (×3): via INTRAVENOUS

## 2018-07-29 MED ORDER — ORAL CARE MOUTH RINSE
15.0000 mL | OROMUCOSAL | Status: DC
  Administered 2018-07-29 (×3): 15 mL via OROMUCOSAL

## 2018-07-29 MED ORDER — SODIUM CHLORIDE 0.9 % IV SOLN
3.0000 g | Freq: Two times a day (BID) | INTRAVENOUS | Status: DC
  Administered 2018-07-29 – 2018-07-31 (×4): 3 g via INTRAVENOUS
  Filled 2018-07-29 (×5): qty 3

## 2018-07-29 MED ORDER — SODIUM CHLORIDE 0.9 % IV BOLUS
1000.0000 mL | Freq: Once | INTRAVENOUS | Status: AC
  Administered 2018-07-29: 1000 mL via INTRAVENOUS

## 2018-07-29 MED ORDER — CHLORHEXIDINE GLUCONATE 0.12% ORAL RINSE (MEDLINE KIT)
15.0000 mL | Freq: Two times a day (BID) | OROMUCOSAL | Status: DC
  Administered 2018-07-29 – 2018-07-31 (×4): 15 mL via OROMUCOSAL

## 2018-07-29 MED ORDER — PHENYLEPHRINE HCL-NACL 10-0.9 MG/250ML-% IV SOLN
INTRAVENOUS | Status: AC
  Administered 2018-07-29: 40 ug/min via INTRAVENOUS
  Filled 2018-07-29: qty 250

## 2018-07-29 MED ORDER — SODIUM CHLORIDE 0.9 % IV SOLN: 250.0000 mL | INTRAVENOUS | Status: DC

## 2018-07-29 MED ORDER — ORAL CARE MOUTH RINSE
15.0000 mL | Freq: Two times a day (BID) | OROMUCOSAL | Status: DC
  Administered 2018-07-29 – 2018-08-02 (×9): 15 mL via OROMUCOSAL

## 2018-07-29 MED ORDER — PANTOPRAZOLE SODIUM 40 MG IV SOLR
40.0000 mg | Freq: Every day | INTRAVENOUS | Status: DC
  Administered 2018-07-29: 40 mg via INTRAVENOUS
  Filled 2018-07-29: qty 40

## 2018-07-29 MED ORDER — INSULIN ASPART 100 UNIT/ML ~~LOC~~ SOLN
0.0000 [IU] | SUBCUTANEOUS | Status: DC
  Administered 2018-07-29: 2 [IU] via SUBCUTANEOUS
  Administered 2018-07-29: 3 [IU] via SUBCUTANEOUS
  Administered 2018-07-31: 2 [IU] via SUBCUTANEOUS
  Administered 2018-07-31 – 2018-08-01 (×2): 3 [IU] via SUBCUTANEOUS

## 2018-07-29 NOTE — Progress Notes (Signed)
This RN entered room r/t vent alarming. Pt was restrained with mitts and on fent infusion. Pt had leaned to the right and was able to grab ETT with right hand and attempted to pull ETT out. RT and ELink called. NO loss of vol on vent. ETT measured 21 at the teeth. Stat chest xray ordered. ETT holder replaced. Family called.

## 2018-07-29 NOTE — Progress Notes (Addendum)
Pt transfer orders discontinued b/c unable to get off phenylephrine per orders from Dr. Chase Caller

## 2018-07-29 NOTE — Progress Notes (Signed)
[facility] Progress Note Patient Name: [name] DOB: [birth year] MRN: [id number]   Date of Service  07/29/2018  HPI/Events of Note  Patient intubated and ventilated. Notified of need for stress ulcer prophylaxis.  eICU Interventions  Will order Protonix IV.      Intervention Category Intermediate Interventions: Best-practice therapies (e.g. DVT, beta blocker, etc.)  Swathi Dauphin Eugene 07/29/2018, 3:02 AM

## 2018-07-29 NOTE — Progress Notes (Signed)
Pharmacy Antibiotic Note  [name] is a 85 y.o. female admitted on 07/28/2018 after an aspiration event and presented hypoxemic and hypotensive requiring intubation and pressors. She has been on Zosyn for possible aspiration PNA. Patient is from SNF, but no other significant risk factors for Pseudomonas infection identified. Pharmacy has been consulted for Unasyn dosing. WBC 20, currently AF. Scr 1.08 stable, estimated CrCl ~24 mL/min.  Plan: Unasyn 3g IV q12h F/u clinical status, C&S, renal function, de-escalation, LOT  Height: 5\' 1"  (154.9 cm) Weight: 121 lb 4.1 oz (55 kg) IBW/kg (Calculated) : 47.8  Temp (24hrs), Avg:96.7 F (35.9 C), Min:93.1 F (33.9 C), Max:99.3 F (37.4 C)  Recent Labs  Lab 07/28/18 1810 07/28/18 1828 07/29/18 0417  WBC 7.8  --  20.0*  CREATININE  --  1.05* 1.08*    Estimated Creatinine Clearance: 24 mL/min (A) (by C-G formula based on SCr of 1.08 mg/dL (H)).    Allergies  Allergen Reactions  . Fluticasone-Salmeterol Other (See Comments)    Per MAR  . Statins Other (See Comments)    Per MAR  . Sulfa Antibiotics Other (See Comments)    Per MAR    Antimicrobials this admission: Zosyn 2/10 >> 2/11 Unasyn 2/11 >>  Microbiology results: 2/10 MRSA PCR: neg 2/10 Bcx: NG <12h 2/10 TA cx: pending  Thank you for allowing pharmacy to be a part of this patient's care.  Mila Merry Gerarda Fraction, PharmD, [place] PGY2 Infectious Diseases Pharmacy Resident Phone: [phone] 07/29/2018 9:43 AM

## 2018-07-29 NOTE — Procedures (Signed)
Extubation Procedure Note  Patient Details:   Name: [name] DOB: 02-05-1922 MRN: [id number]   Airway Documentation:    Vent end date: 07/29/18 Vent end time: 1132   Evaluation  O2 sats: stable throughout Complications: No apparent complications Patient did tolerate procedure well. Bilateral Breath Sounds: Diminished, Rhonchi   Yes   PT was extubated to a 3L [place]  PT was able to speak and has a strong cough  Zeynep Fantroy, Leonie Douglas 07/29/2018, 11:32 AM

## 2018-07-29 NOTE — Progress Notes (Signed)
   [name], MRN:  [id number], DOB:  [birth year], LOS: 1 ADMISSION DATE:  07/28/2018, CONSULTATION DATE:  07/28/18 REFERRING MD:  Pattricia Boss, MD CHIEF COMPLAINT:  Choking  Brief History   85 year old female SNF resident admitted after choking on grilled cheese sandwich. Arrived intubated with BVM. On arrival, patient hypoxemic and hypotensive. Pulmonary consulted in ED and emergent bronchoscopy performed with food retrieval. S/p bronch hypoxemia improved.  Past Medical History  Dementia, asthma, CKD, CAD s.p stent in 1996, HTN, DM2, GERD  Significant Hospital Events   2/10> Admitted and intubated 2/11 working on weaning.   Consults:  PCCM  Procedures:  ETT 2/10>  Significant Diagnostic Tests:  Bronchoscopy 2/10> Food debris in airways bilaterally. Suctioned and removed as much material as possible. Prior to removal of scope, no foreign material visualized main, segmental and subsegmental airways.  Micro Data:  Trach Asp 2/10>  Antimicrobials:  Zosyn 2/10>2/11 unasyn 2/11>>>  Interim history/subjective:  More awake. No distress.   Objective   Blood pressure (Abnormal) 88/45, pulse 81, temperature 98.6 F (37 C), temperature source Oral, resp. rate (Abnormal) 9, height 5\' 1"  (1.549 m), weight 55 kg, SpO2 100 %.    Vent Mode: PRVC FiO2 (%):  [40 %-100 %] 40 % Set Rate:  [16 bmp] 16 bmp Vt Set:  [380 mL-433 mL] 433 mL PEEP:  [5 cmH20-8 cmH20] 5 cmH20 Plateau Pressure:  [16 cmH20-25 cmH20] 19 cmH20   Intake/Output Summary (Last 24 hours) at 07/29/2018 0935 Last data filed at 07/29/2018 0800 Gross per 24 hour  Intake 3339.42 ml  Output 200 ml  Net 3139.42 ml   Filed Weights   07/28/18 1810  Weight: 55 kg    Physical Exam: General: This is a chronically ill-appearing 85 year old female she is currently on full ventilatory support HEENT normocephalic atraumatic orally intubated Pulmonary: Coarse scattered rhonchi no accessory use Cardiac: Regular rate and  rhythm Abdomen: Soft nontender no organomegaly Extremities: Warm dry brisk capillary refill scattered areas of ecchymosis no significant edema Neuro: Awake, interactive, moves all extremities and follows commands. GU: Clear yellow  Resolved Hospital Problem list     Assessment & Plan:  Acute hypoxemic respiratory failure secondary to foreign body S/p bronchoscopy with food retrieval Portable chest x-ray personally reviewed on 2/11.  This continues to demonstrate right sided airspace disease however right upper lobe is recruited compared to admitting film Plan Dc sedation  Daily assessment for SBT VAP bundle  Day 2 abx, change to unasyn & narrow once cultures back  Septic Shock secondary to aspiration pneumonitis Plan Cont MIVFs abx per above Titrate neo for MAP > 65  Fluid and electrolyte imbalance: hypomagnesemia  Plan Replace and recheck  Hyperglycemia  Plan ssi    Best practice:  Diet: NPO Pain/Anxiety/Delirium protocol (if indicated): If indicated VAP protocol (if indicated): Yes DVT prophylaxis: SCDs GI prophylaxis: PPI Glucose control: CBG Mobility: BR Code Status: DNR. Daughter agrees with continuing care however no chest compressions or shock Family Communication: Daughter at bedside Disposition: Admit to ICU; remains critically ill. Working on Careers adviser. Stopping sedation gtts.  Erick Colace ACNP-BC [facility] Pager # [phone] OR # [phone] if no answer

## 2018-07-29 NOTE — Progress Notes (Signed)
[facility] Progress Note Patient Name: [name] DOB: [birth year] MRN: [id number]   Date of Service  07/29/2018  HPI/Events of Note  Multiple issues: Oliguria and 2. Mg++ = 1.3 and Creatinine = 1.0.   eICU Interventions  Will order: 1. Bolus with 0.9 NaCl 1 liter IV over 1 hour now.  2. Replace Mg++.      Intervention Category Major Interventions: Electrolyte abnormality - evaluation and management;Hypovolemia - evaluation and treatment with fluids  Sommer,Steven Cornelia Copa 07/29/2018, 6:21 AM

## 2018-07-30 ENCOUNTER — Inpatient Hospital Stay (HOSPITAL_COMMUNITY): Payer: Medicare (Managed Care)

## 2018-07-30 DIAGNOSIS — R0902 Hypoxemia: Secondary | ICD-10-CM

## 2018-07-30 DIAGNOSIS — F0390 Unspecified dementia without behavioral disturbance: Secondary | ICD-10-CM

## 2018-07-30 DIAGNOSIS — J189 Pneumonia, unspecified organism: Secondary | ICD-10-CM

## 2018-07-30 DIAGNOSIS — E440 Moderate protein-calorie malnutrition: Secondary | ICD-10-CM

## 2018-07-30 DIAGNOSIS — Z515 Encounter for palliative care: Secondary | ICD-10-CM

## 2018-07-30 LAB — GLUCOSE, CAPILLARY
Glucose-Capillary: 85 mg/dL (ref 70–99)
Glucose-Capillary: 79 mg/dL (ref 70–99)
Glucose-Capillary: 78 mg/dL (ref 70–99)
Glucose-Capillary: 100 mg/dL — ABNORMAL HIGH (ref 70–99)
Glucose-Capillary: 98 mg/dL (ref 70–99)
Glucose-Capillary: 70 mg/dL (ref 70–99)

## 2018-07-30 LAB — CBC
HCT: 35.2 % — ABNORMAL LOW (ref 36.0–46.0)
Hemoglobin: 10.6 g/dL — ABNORMAL LOW (ref 12.0–15.0)
MCH: 30.8 pg (ref 26.0–34.0)
MCHC: 30.1 g/dL (ref 30.0–36.0)
MCV: 102.3 fL — ABNORMAL HIGH (ref 80.0–100.0)
Platelets: DECREASED 10*3/uL (ref 150–400)
RBC: 3.44 MIL/uL — ABNORMAL LOW (ref 3.87–5.11)
RDW: 15.6 % — ABNORMAL HIGH (ref 11.5–15.5)
WBC: 10.4 10*3/uL (ref 4.0–10.5)
nRBC: 0 % (ref 0.0–0.2)

## 2018-07-30 LAB — BASIC METABOLIC PANEL
Anion gap: 4 — ABNORMAL LOW (ref 5–15)
BUN: 12 mg/dL (ref 8–23)
CO2: 24 mmol/L (ref 22–32)
Calcium: 7.6 mg/dL — ABNORMAL LOW (ref 8.9–10.3)
Chloride: 113 mmol/L — ABNORMAL HIGH (ref 98–111)
Creatinine, Ser: 0.89 mg/dL (ref 0.44–1.00)
GFR calc Af Amer: >60 mL/min (ref >60)
GFR calc non Af Amer: 55 mL/min — ABNORMAL LOW (ref >60)
Glucose, Bld: 108 mg/dL — ABNORMAL HIGH (ref 70–99)
Potassium: 3.8 mmol/L (ref 3.5–5.1)
Sodium: 141 mmol/L (ref 135–145)

## 2018-07-30 LAB — PHOSPHORUS: Phosphorus: 2.6 mg/dL (ref 2.5–4.6)

## 2018-07-30 LAB — MAGNESIUM: MAGNESIUM: 2.2 mg/dL (ref 1.7–2.4)

## 2018-07-30 NOTE — Consult Note (Signed)
Consultation Note Date: 07/30/2018   Patient Name: [name]  DOB: 17-Nov-1921  MRN: [id number]  Age / Sex: 85 y.o., female  PCP: Janifer Adie, MD Referring Physician: Tawni Millers  Reason for Consultation: Establishing goals of care  HPI/Patient Profile: 85 y.o. female  with past medical history of dementia, CKD, CAD s/p stents, HTN, DM, GERD admitted on 07/28/2018 after choking on grilled cheese sandwich. In ED, patient hypoxemic and hypotensive. Pulmonary consulted in ED and emergent bronchoscopy performed with food retrieval. Hypoxemia improved s/p bronch. Intubated on arrival. Extubated 2/11. Palliative medicine consultation for goals of care.    Clinical Assessment and Goals of Care:  I have reviewed medical records, discussed with care team, and assessed the patient. [name] wakes to voice. She is alert to name and can tell me she is at [facility]. Denies pain or discomfort. No family at bedside.   Spoke with daughter, Izora Gala, via telephone for 20 minutes.   I introduced Palliative Medicine as specialized medical care for people living with serious illness. It focuses on providing relief from the symptoms and stress of a serious illness. The goal is to improve quality of life for both the patient and the family.  We discussed a brief life review of the patient. [name] was living alone in an apartment with intermittent caregivers until this past June when she suffered an MI. She has since been living at [facility]. Within the last year, she was diagnosed with dementia. Daughter reports that she can become easily frustrated because she has poor hearing and vision. "World of darkness. World of silence." She has been losing weight and been on a pureed diet duet to dysphagia. She requires assist with feeding. Izora Gala shares that eating is one of her only "pleasures" these days.     Izora Gala does share that her mother has finally accepted she couldn't live in her apartment anymore. She has "made an effort to be happy at [facility]   Izora Gala reports that she is POA. I encouraged her to bring POA paperwork tomorrow to scan into epic. Izora Gala is interested in reviewing and possibly completing MOST form with me tomorrow. We plan to meet at 1pm.    SUMMARY OF RECOMMENDATIONS    Continue current plan of care and medical interventions.  [place]uman meeting with daughter, Izora Gala, tomorrow 07/31/18 at [place]:  DNR  Symptom Management:   Per attending  Palliative Prophylaxis:   Aspiration, Delirium Protocol, Oral Care and Turn Reposition  Psycho-social/Spiritual:   Desire for further Chaplaincy support:yes  Additional Recommendations: Caregiving  Support/Resources, Compassionate Wean Education and Education on Hospice  Prognosis:    Unable to determine  Discharge Planning: To Be Determined      Primary Diagnoses: Present on Admission: . Aspiration of foreign body in respiratory tract   I have reviewed the medical record, interviewed the patient and family, and examined the patient. The following aspects are pertinent.  Past Medical History:  Diagnosis Date  .  Anemia    a. Mild, noted on bloodwork 12/2013.  Marland Kitchen Arthritis    "all over" (02/09/2013)  . Asthma   . Blood transfusion reaction   . CAD (coronary artery disease)    a. prior hx of RCA stent in 1996. b. 12/2013: inferior STEMI s/p extensive PTCA only of occluded RCA (no stents); distal LAD and OM were chronically occluded, EF 55-60%.  . Cardiogenic shock ([place])    a. 12/2013: during STEMI with SBP 60s, AV block/bradycardia, resolved with reperfusion.  . CKD (chronic kidney disease), stage III ([place])    a. "one kidney rotted" (02/09/2013). (?) b. likely stage III by historical bloodwork.  . Depression   . Falls frequently   . GERD (gastroesophageal reflux disease)   .  Glaucoma, right eye   . Hernia   . HTN (hypertension)   . Hyperlipidemia   . Mild dementia ([place])   . Seasonal allergies   . Skin cancer of nose    "left side" (02/09/2013)  . Stroke [facility]) 1960's   "a light one" (02/09/2013)  . Thrombocytopenia ([place])    a. Mild, noted on bloodwork 12/2013.  . Type II diabetes mellitus ([place])   . Vertigo    Social History   Socioeconomic History  . Marital status: Widowed    Spouse name: Not on file  . Number of children: Not on file  . Years of education: Not on file  . Highest education level: Not on file  Occupational History  . Not on file  Social Needs  . Financial resource strain: Not on file  . Food insecurity:    Worry: Not on file    Inability: Not on file  . Transportation needs:    Medical: Not on file    Non-medical: Not on file  Tobacco Use  . Smoking status: Former Smoker    Packs/day: 3.00    Years: 32.00    Pack years: 96.00    Types: Cigarettes    Last attempt to quit: 06/18/1973    Years since quitting: 45.1  . Smokeless tobacco: Never Used  Substance and Sexual Activity  . Alcohol use: No    Comment: 02/09/2013 "haven't had a drink in 29 yr; used to have a beer once in awhile"  . Drug use: No  . Sexual activity: Never  Lifestyle  . Physical activity:    Days per week: Not on file    Minutes per session: Not on file  . Stress: Not on file  Relationships  . Social connections:    Talks on phone: Not on file    Gets together: Not on file    Attends religious service: Not on file    Active member of club or organization: Not on file    Attends meetings of clubs or organizations: Not on file    Relationship status: Not on file  Other Topics Concern  . Not on file  Social History Narrative  . Not on file   Family History  Problem Relation Age of Onset  . Cancer Mother   . Cancer Father   . Diabetes Son   . Diabetes Brother   . Stroke Son   . Stroke Daughter   . Heart attack Neg Hx    Scheduled Meds: .  chlorhexidine gluconate (MEDLINE KIT)  15 mL Mouth Rinse BID  . heparin  5,000 Units Subcutaneous Q8H  . insulin aspart  0-15 Units Subcutaneous Q4H  . mouth rinse  15 mL Mouth Rinse  BID   Continuous Infusions: . sodium chloride 75 mL/hr at 07/30/18 1300  . sodium chloride    . ampicillin-sulbactam (UNASYN) IV 3 g (07/30/18 0543)  . phenylephrine (NEO-SYNEPHRINE) Adult infusion 20 mcg/min (07/30/18 1300)   PRN Meds:.sodium chloride Medications Prior to Admission:  Prior to Admission medications   Medication Sig Start Date End Date Taking? Authorizing Provider  acetaminophen (TYLENOL) 325 MG tablet Take 650 mg by mouth daily.   Yes [provider]  aspirin 81 MG EC tablet Take 1 tablet (81 mg total) by mouth daily. 11/18/17  Yes Santos-Sanchez, Merlene Morse, MD  bisacodyl (DULCOLAX) 10 MG suppository Place 10 mg rectally daily as needed for moderate constipation.   Yes [provider]  divalproex (DEPAKOTE SPRINKLE) 125 MG capsule Take 125 mg by mouth daily at 12 noon.    Yes [provider]  glipiZIDE (GLUCOTROL XL) 2.5 MG 24 hr tablet Take 2.5 mg by mouth daily with breakfast.   Yes [provider]  loperamide (IMODIUM A-D) 2 MG tablet Take 2 mg by mouth as needed for diarrhea or loose stools.   Yes [provider]  LORazepam (ATIVAN) 0.5 MG tablet Take 0.25 mg by mouth every 6 (six) hours as needed for anxiety.    Yes [provider]  LORazepam (ATIVAN) 2 MG/ML concentrated solution Take 1 mg by mouth every 4 (four) hours as needed for anxiety. Apply 1 ml to wrist.   Yes [provider]  magnesium hydroxide (MILK OF MAGNESIA) 400 MG/5ML suspension Take 30 mLs by mouth daily as needed for mild constipation.   Yes [provider]  metoprolol succinate (TOPROL XL) 25 MG 24 hr tablet Take 0.5 tablets (12.5 mg total) by mouth daily. 11/18/17  Yes Santos-Sanchez, Merlene Morse, MD  nitroGLYCERIN (NITROSTAT) 0.4 MG SL tablet Place 1 tablet  (0.4 mg total) under the tongue every 5 (five) minutes as needed for chest pain (up to 3 doses). 01/19/14  Yes Dunn, Dayna N, PA-C  ondansetron (ZOFRAN-ODT) 4 MG disintegrating tablet Take 4 mg by mouth every 6 (six) hours as needed for nausea or vomiting.    Yes [provider]  ranitidine (ZANTAC) 150 MG tablet Take 150 mg by mouth 2 (two) times daily as needed for heartburn.   Yes [provider]  risperiDONE microspheres (RISPERDAL CONSTA) 25 MG injection Inject 25 mg into the muscle every 14 (fourteen) days.   Yes [provider]  senna-docusate (SENNA S) 8.6-50 MG tablet Take 1-2 tablets by mouth 2 (two) times daily. Take 2 tablets every morning then take 1 tablet every evening   Yes [provider]  sertraline (ZOLOFT) 100 MG tablet Take 100 mg by mouth daily.   Yes [provider]  Sodium Phosphates (RA SALINE ENEMA) 19-7 GM/118ML ENEM Place 118 mLs rectally daily as needed (constipation).   Yes [provider]  Vitamin D, Ergocalciferol, (DRISDOL) 1.25 MG (50000 UT) CAPS capsule Take 50,000 Units by mouth every 30 (thirty) days.   Yes [provider]   Allergies  Allergen Reactions  . Fluticasone-Salmeterol Other (See Comments)    Per MAR  . Statins Other (See Comments)    Per MAR  . Sulfa Antibiotics Other (See Comments)    Per MAR   Review of Systems  Unable to perform ROS: Dementia   Physical Exam Vitals signs and nursing note reviewed.  Constitutional:      Appearance: She is cachectic. She is ill-appearing.  HENT:     Head:  Normocephalic and atraumatic.  Cardiovascular:     Rate and Rhythm: Normal rate.  Pulmonary:     Effort: No tachypnea, accessory muscle usage or respiratory distress.  Abdominal:     Tenderness: There is no abdominal tenderness.  Skin:    General: Skin is warm and dry.     Coloration: Skin is pale.  Neurological:     Mental Status: She is alert and easily aroused.     Comments:  Oriented to person/place. Baseline dementia with pleasant confusion.  Psychiatric:        Mood and Affect: Mood normal.        Speech: Speech normal.        Behavior: Behavior normal.    Vital Signs: BP 119/63   Pulse 87   Temp (!) 96.5 F (35.8 C) (Axillary)   Resp 16   Ht _0  (1.549 m)   Wt 55 kg   SpO2 100%   BMI 22.91 kg/m  Pain Scale: 0-10   Pain Score: 0-No pain   SpO2: SpO2: 100 % O2 Device:SpO2: 100 % O2 Flow Rate: .O2 Flow Rate (L/min): 2 L/min  IO: Intake/output summary:   Intake/Output Summary (Last 24 hours) at 07/30/2018 1601 Last data filed at 07/30/2018 1300 Gross per 24 hour  Intake 2709.3 ml  Output -  Net 2709.3 ml    LBM:   Baseline Weight: Weight: 55 kg Most recent weight: Weight: 55 kg     Palliative Assessment/Data: PPS 40%   Flowsheet Rows     Most Recent Value  Intake Tab  Referral Department  Critical care  Unit at Time of Referral  ICU  Palliative Care Primary Diagnosis  Sepsis/Infectious Disease  Palliative Care Type  New Palliative care  Reason for referral  Clarify Goals of Care  Date first seen by Palliative Care  07/30/18  Clinical Assessment  Palliative Performance Scale Score  40%  Psychosocial & Spiritual Assessment  Palliative Care Outcomes  Patient/Family meeting held?  Yes  Who was at the meeting?  daughter  Palliative Care Outcomes  Clarified goals of care, ACP counseling assistance, Provided psychosocial or spiritual support      Time In: 1530 Time Out: 1610 Time Total: 40 Greater than 50%  of this time was spent counseling and coordinating care related to the above assessment and plan.  Signed by:  Ihor Dow, FNP-C Palliative Medicine Team  Phone: [phone] Fax: [phone]   Please contact Palliative Medicine Team phone at [phone] for questions and concerns.  For individual provider: See Shea Evans

## 2018-07-30 NOTE — Plan of Care (Signed)

## 2018-07-30 NOTE — Evaluation (Addendum)
Clinical/Bedside Swallow Evaluation Patient Details  Name: [name] MRN: [id number] Date of Birth: [birth year]  Today's Date: 07/30/2018 Time: SLP Start Time (ACUTE ONLY): 1145 SLP Stop Time (ACUTE ONLY): 1155 SLP Time Calculation (min) (ACUTE ONLY): 10 min  Past Medical History:  Past Medical History:  Diagnosis Date  . Anemia    a. Mild, noted on bloodwork 12/2013.  Marland Kitchen Arthritis    "all over" (02/09/2013)  . Asthma   . Blood transfusion reaction   . CAD (coronary artery disease)    a. prior hx of RCA stent in 1996. b. 12/2013: inferior STEMI s/p extensive PTCA only of occluded RCA (no stents); distal LAD and OM were chronically occluded, EF 55-60%.  . Cardiogenic shock ([place])    a. 12/2013: during STEMI with SBP 60s, AV block/bradycardia, resolved with reperfusion.  . CKD (chronic kidney disease), stage III ([place])    a. "one kidney rotted" (02/09/2013). (?) b. likely stage III by historical bloodwork.  . Depression   . Falls frequently   . GERD (gastroesophageal reflux disease)   . Glaucoma, right eye   . Hernia   . HTN (hypertension)   . Hyperlipidemia   . Mild dementia ([place])   . Seasonal allergies   . Skin cancer of nose    "left side" (02/09/2013)  . Stroke [facility]) 1960's   "a light one" (02/09/2013)  . Thrombocytopenia ([place])    a. Mild, noted on bloodwork 12/2013.  . Type II diabetes mellitus ([place])   . Vertigo    Past Surgical History:  Past Surgical History:  Procedure Laterality Date  . APPENDECTOMY    . CARDIAC CATHETERIZATION  2005   Archie Endo 05/07/2006 (02/09/2013)  . CHOLECYSTECTOMY  02/2005   Archie Endo 02/28/2005 (02/09/2013)  . CORONARY ANGIOPLASTY WITH STENT PLACEMENT    . EYE SURGERY Left   . LEFT HEART CATHETERIZATION WITH CORONARY ANGIOGRAM Bilateral 01/15/2014   Procedure: LEFT HEART CATHETERIZATION WITH CORONARY ANGIOGRAM;  Surgeon: Peter M Martinique, MD;  Location: [facility] CATH LAB;  Service: Cardiovascular;  Laterality: Bilateral;  . NEPHRECTOMY Left    Archie Endo  02/27/2005 (02/09/2013)  . TUBAL LIGATION    . UMBILICAL HERNIA REPAIR    . VITRECTOMY Right    /notes 01/19/2002  (02/09/2013)   HPI:  Pt is a 85 y.o female SNF resident with PMH pf dementia, asthma, CKD, CAD, HTN, DM2, GERD and dysphagia admitted to ED (2/10) after choking on grilled cheese sandwhich. Intubated 2/10- 2/11   Assessment / Plan / Recommendation Clinical Impression  Pt with hx of dysphagia seen for BSE after choking incident in SNF. Pt unable to give report of indicent or prior hx of dysphagia due to dementia. All thin liquid trials via cup, teaspoon and straw resulted in immediate coughing and wet vocal quality, consistent with signs of aspiration. Puree consumption tolerated, but after a few bites pt refused to eat. Pt observed to be coughing up mucous intermittently with PO intake, which she was able to cough up to mouth to be suctioned. Given recent intubation and hx of dysphagia, recommend NPO with meds in puree until pt exhibits PO readiness as demonstrated by minimal coughing and dry vocal quality.  SLP Visit Diagnosis: Dysphagia, unspecified (R13.10)    Aspiration Risk  Severe aspiration risk    Diet Recommendation NPO except meds   Medication Administration: Crushed with puree Supervision: Staff to assist with self feeding;Full supervision/cueing for compensatory strategies Compensations: Small sips/bites Postural Changes: Seated upright at 90 degrees  Other  Recommendations Oral Care Recommendations: Oral care QID   Follow up Recommendations Skilled Nursing facility      Frequency and Duration min 2x/week  2 weeks       Prognosis Prognosis for Safe Diet Advancement: Good      Swallow Study   General HPI: Pt is a 85 y.o female SNF resident with PMH pf dementia, asthma, CKD, CAD, HTN, DM2, GERD and dysphagia admitted to ED (2/10) after choking on grilled cheese sandwhich. Intubated 2/10- 2/11 Type of Study: Bedside Swallow Evaluation Diet Prior to this  Study: NPO Temperature Spikes Noted: No Respiratory Status: Nasal cannula History of Recent Intubation: Yes Length of Intubations (days): 2 days Date extubated: 07/29/18 Behavior/Cognition: Alert;Agitated;Cooperative Oral Cavity Assessment: Within Functional Limits Oral Care Completed by SLP: No Oral Cavity - Dentition: Edentulous Vision: Impaired for self-feeding Self-Feeding Abilities: Total assist;Refused PO Patient Positioning: Upright in bed Baseline Vocal Quality: Hoarse;Wet Volitional Cough: Strong    Oral/Motor/Sensory Function Overall Oral Motor/Sensory Function: Within functional limits   Ice Chips Ice chips: Not tested   Thin Liquid Thin Liquid: Impaired Presentation: Cup;Spoon;Straw Pharyngeal  Phase Impairments: Suspected delayed Swallow;Wet Vocal Quality;Cough - Immediate    Nectar Thick Nectar Thick Liquid: Not tested   Honey Thick Honey Thick Liquid: Not tested   Puree Puree: Within functional limits Presentation: Spoon   Solid     Solid: Not tested      [name], SLP Student 07/30/2018,12:18 PM

## 2018-07-30 NOTE — Progress Notes (Signed)
PROGRESS NOTE    [name]  YYQ:[id number] DOB: [birth year] DOA: 07/28/2018 PCP: Janifer Adie, MD    Brief Narrative:  85 year old female who presented with choking.  She does have significant past medical history for dysphasia.  Apparently she was found choking after eating a cheese sandwich.  She was intubated on the field, and the emergency department she was hypoxemic and hypotensive.  Patient underwent bronchoscopy in the emergency department for foreign object extraction.  Her blood pressure was 100/67, heart rate 91, respirate 16, oxygen saturation 100% on PRVC mechanical ventilation.  Lungs have diffuse rhonchi, heart is also present regular, abdomen soft, no lower extremity edema.   Patient was admitted to the hospital working diagnosis of acute hypoxic respiratory failure due to foregin object aspiration.  Assessment & Plan:   Active Problems:   Dementia without behavioral disturbance (HCC)   Aspiration of foreign body in respiratory tract   Pressure injury of skin   Palliative care by specialist   Hypoxemia   Pneumonia due to infectious organism   Malnutrition of moderate degree   1. Acute hypoxic respiratory failure, complicated with aspiration pneumonia. Patient was successfully liberated from mechanical ventilation, will continue oxymetry monitoring and supplemental 02 per [place]. Follow with swallow evaluation, continue aspiration precautions. Continue antibiotic therapy with Unasyn.   2. Septic shock due to aspiration pneumonia, right lower lobe, present on admission. Continue IV fluids and IV antibiotic therapy, patient is hemodynamically stable, now off vasopressors since 1 pm.  3. Hyperglycemia. Continue insulin sliding scale for glucose cover and monitoring, patient continue to be NPO.   4. Moderate calorie protein malnutrition. Will continue nutritional supplements.   5. Dementia. Continue neuro checks per unit protocol. Patient on depakote, lorazepam,  risperidone, and sertraline at home.   6, HTN. Will continue to hold on blood pressure meds for now, patient at home on metoprolol.   DVT prophylaxis: enoxaparin   Code Status:  dnr  Family Communication:  No family at the bedside  Disposition Plan/ discharge barriers: pending clinical improvement.   Body mass index is 22.91 kg/m. Malnutrition Type:  Nutrition Problem: Moderate Malnutrition Etiology: dysphagia, wound healing, chronic illness(Dementia, Swallowing issues, )   Malnutrition Characteristics:  Signs/Symptoms: moderate fat depletion, severe muscle depletion   Nutrition Interventions:  Interventions: Refer to RD note for recommendations  RN Pressure Injury Documentation: Pressure Injury 07/28/18 Stage II -  Partial thickness loss of dermis presenting as a shallow open ulcer with a red, pink wound bed without slough. (Active)  07/28/18 2100  Location: Coccyx  Location Orientation: Right;Left  Staging: Stage II -  Partial thickness loss of dermis presenting as a shallow open ulcer with a red, pink wound bed without slough.  Wound Description (Comments):   Present on Admission: Yes     Consultants:     Procedures:     Antimicrobials:       Subjective: Patient has been somnolent and hyporeactive, not in apparent pain or dyspnea, no agitation.   Objective: Vitals:   07/30/18 1200 07/30/18 1300 07/30/18 1400 07/30/18 1515  BP: (!) 120/54 121/63 119/63   Pulse: (!) 107 91 87   Resp: (!) _0 Temp:    (!) 96.5 F (35.8 C)  TempSrc:    Axillary  SpO2: (!) 81% 100% 100%   Weight:      Height:        Intake/Output Summary (Last 24 hours) at 07/30/2018 1745 Last data filed at 07/30/2018 1300  Gross per 24 hour  Intake 2652.11 ml  Output -  Net 2652.11 ml   Filed Weights   07/28/18 1810  Weight: 55 kg    Examination:   General: deconditioned.  Neurology: somnolent but easy to arouse E ENT: positive pallor, no icterus, oral mucosa  dry Cardiovascular: No JVD. S1-S2 present, rhythmic, no gallops, rubs, or murmurs. No lower extremity edema. Pulmonary: positive breath sounds bilaterally, no wheezing, scattered rhonchi and rales, bilaterally. Gastrointestinal. Abdomen with no organomegaly, non tender, no rebound or guarding Skin. No rashes Musculoskeletal: no joint deformities     Data Reviewed: I have personally reviewed following labs and imaging studies  CBC: Recent Labs  Lab 07/28/18 1810 07/28/18 2112 07/29/18 0417 07/30/18 0655  WBC 7.8  --  20.0* 10.4  HGB 13.4 13.3 12.2 10.6*  HCT 42.7 39.0 38.8 35.2*  MCV 101.4*  --  100.3* 102.3*  PLT 162  --  144* PLATELET CLUMPS NOTED ON SMEAR, COUNT APPEARS DECREASED   Basic Metabolic Panel: Recent Labs  Lab 07/28/18 1828 07/28/18 2112 07/29/18 0417 07/30/18 0655  NA 137 139 141 141  K 4.7 3.9 4.6 3.8  CL 100  --  108 113*  CO2 26  --  24 24  GLUCOSE 309*  --  244* 108*  BUN 20  --  19 12  CREATININE 1.05*  --  1.08* 0.89  CALCIUM 9.0  --  8.2* 7.6*  MG  --   --  1.3* 2.2  PHOS  --   --  3.0 2.6   GFR: Estimated Creatinine Clearance: 29.2 mL/min (by C-G formula based on SCr of 0.89 mg/dL). Liver Function Tests: Recent Labs  Lab 07/28/18 1828  AST 23  ALT 12  ALKPHOS 68  BILITOT 0.4  PROT 6.5  ALBUMIN 2.9*   No results for input(s): LIPASE, AMYLASE in the last 168 hours. No results for input(s): AMMONIA in the last 168 hours. Coagulation Profile: No results for input(s): INR, PROTIME in the last 168 hours. Cardiac Enzymes: No results for input(s): CKTOTAL, CKMB, CKMBINDEX, TROPONINI in the last 168 hours. BNP (last 3 results) No results for input(s): PROBNP in the last 8760 hours. HbA1C: No results for input(s): HGBA1C in the last 72 hours. CBG: Recent Labs  Lab 07/29/18 2327 07/30/18 0341 07/30/18 0759 07/30/18 1305 07/30/18 1514  GLUCAP 85 79 78 100* 98   Lipid Profile: Recent Labs    07/28/18 1815  TRIG 134   Thyroid  Function Tests: No results for input(s): TSH, T4TOTAL, FREET4, T3FREE, THYROIDAB in the last 72 hours. Anemia Panel: No results for input(s): VITAMINB12, FOLATE, FERRITIN, TIBC, IRON, RETICCTPCT in the last 72 hours.    Radiology Studies: I have reviewed all of the imaging during this hospital visit personally     Scheduled Meds: . chlorhexidine gluconate (MEDLINE KIT)  15 mL Mouth Rinse BID  . heparin  5,000 Units Subcutaneous Q8H  . insulin aspart  0-15 Units Subcutaneous Q4H  . mouth rinse  15 mL Mouth Rinse BID   Continuous Infusions: . sodium chloride 75 mL/hr at 07/30/18 1300  . sodium chloride    . ampicillin-sulbactam (UNASYN) IV 3 g (07/30/18 0543)  . phenylephrine (NEO-SYNEPHRINE) Adult infusion 20 mcg/min (07/30/18 1300)     LOS: 2 days        Jeanni Allshouse Gerome Apley, MD

## 2018-07-30 NOTE — Progress Notes (Addendum)
Initial Nutrition Assessment  DOCUMENTATION CODES:   Non-severe (moderate) malnutrition in context of chronic illness  INTERVENTION:  After diet advancement:  Ensure Enlive po TID, each supplement provides 350 kcal and 20 grams of protein  Magic cup BID with lunch and dinner, each supplement provides 290 kcal and 9 grams of protein  NUTRITION DIAGNOSIS:   Moderate Malnutrition related to dysphagia, wound healing, chronic illness(Dementia, Swallowing issues, ) as evidenced by moderate fat depletion, severe muscle depletion.    GOAL:   Patient will meet greater than or equal to 90% of their needs    MONITOR:   Diet advancement, PO intake, Supplement acceptance, Weight trends, Skin  REASON FOR ASSESSMENT:   Malnutrition Screening Tool Malnutrition Eval  ASSESSMENT:   Pt is 35y F from SNF with PMH CKD, GERD, DM2, Stroke, CAD, hyperlipidemia, Anemia, and mild dementia. Pt is now admitted for Choking and possible aspiration PNA, Pt also has a pressure injury.    2/10 Pt was intubated, bronc was preformed, food debris removed.  2/11 Extubated.   Pt has dementia and is currently oriented to self, blind, and hard of hearing. Unable to access diet recall.  SLP evaluating Pt and noted hx of dysphagia and is currently NPO except meds. Pt notes that she is in pain on her backside (from the Stage II pressure injury) and in her legs.   Labs reviewed.   Medications reviewed and include: NovoLog SSI 0-15 units.  Ampicillin-Sulbactam (Unasyn) for aspiration pneumonia.  Phenylephrine (Neosynephrine) 10-0.9mg /264mL% infusion--pressor medication.   Unable to assess energy intake and interpretation of wt loss for severe malnutrition diagnosis. Wt encounters in chart is over several years and is not accurate for diagnosis. With current information available Pt meets clinical characteristics for Moderate Malnutrition, but is likey that Pt may have severe malnutrition.    NUTRITION - FOCUSED  PHYSICAL EXAM:    Most Recent Value  Orbital Region  Severe depletion  Upper Arm Region  Moderate depletion  Thoracic and Lumbar Region  Moderate depletion  Buccal Region  Moderate depletion  Temple Region  Moderate depletion  Clavicle Bone Region  Moderate depletion  Clavicle and Acromion Bone Region  Severe depletion  Scapular Bone Region  Severe depletion  Dorsal Hand  Unable to assess [Edema/Swollen]  Patellar Region  Severe depletion  Anterior Thigh Region  Severe depletion  Posterior Calf Region  Severe depletion  Edema (RD Assessment)  None [Hands/forearm Swollen]  Hair  Reviewed  Eyes  Reviewed  Mouth  Reviewed  Skin  Reviewed  Nails  Reviewed       Diet Order:   Diet Order            Diet NPO time specified  Diet effective now              EDUCATION NEEDS:   No education needs have been identified at this time  Skin:  Skin Assessment: Skin Integrity Issues: Skin Integrity Issues:: Stage II Stage II: Coccyx  Last BM:  2/12  Height:   Ht Readings from Last 1 Encounters:  07/28/18 5\' 1"  (1.549 m)    Weight:   Wt Readings from Last 1 Encounters:  07/28/18 55 kg    Ideal Body Weight:  47.7 kg  BMI:  Body mass index is 22.91 kg/m.  Estimated Nutritional Needs:   Kcal:  1400-1700  Protein:  70-85  Fluid:  >1.5l    Herma Carson, [place] Dietetic Intern

## 2018-07-31 DIAGNOSIS — Z7189 Other specified counseling: Secondary | ICD-10-CM

## 2018-07-31 DIAGNOSIS — Z66 Do not resuscitate: Secondary | ICD-10-CM

## 2018-07-31 DIAGNOSIS — R1310 Dysphagia, unspecified: Secondary | ICD-10-CM

## 2018-07-31 LAB — BASIC METABOLIC PANEL
Anion gap: 6 (ref 5–15)
BUN: 10 mg/dL (ref 8–23)
CO2: 21 mmol/L — ABNORMAL LOW (ref 22–32)
Calcium: 7.9 mg/dL — ABNORMAL LOW (ref 8.9–10.3)
Chloride: 114 mmol/L — ABNORMAL HIGH (ref 98–111)
Creatinine, Ser: 0.78 mg/dL (ref 0.44–1.00)
GFR calc Af Amer: >60 mL/min (ref >60)
GFR calc non Af Amer: >60 mL/min (ref >60)
Glucose, Bld: 159 mg/dL — ABNORMAL HIGH (ref 70–99)
POTASSIUM: 4.6 mmol/L (ref 3.5–5.1)
Sodium: 141 mmol/L (ref 135–145)

## 2018-07-31 LAB — GLUCOSE, CAPILLARY
Glucose-Capillary: 106 mg/dL — ABNORMAL HIGH (ref 70–99)
Glucose-Capillary: 108 mg/dL — ABNORMAL HIGH (ref 70–99)
Glucose-Capillary: 145 mg/dL — ABNORMAL HIGH (ref 70–99)
Glucose-Capillary: 159 mg/dL — ABNORMAL HIGH (ref 70–99)
Glucose-Capillary: 81 mg/dL (ref 70–99)
Glucose-Capillary: 90 mg/dL (ref 70–99)
Glucose-Capillary: 98 mg/dL (ref 70–99)

## 2018-07-31 LAB — CULTURE, RESPIRATORY W GRAM STAIN: Culture: NORMAL

## 2018-07-31 LAB — CBC
HCT: 36.7 % (ref 36.0–46.0)
Hemoglobin: 11.1 g/dL — ABNORMAL LOW (ref 12.0–15.0)
MCH: 30.7 pg (ref 26.0–34.0)
MCHC: 30.2 g/dL (ref 30.0–36.0)
MCV: 101.7 fL — ABNORMAL HIGH (ref 80.0–100.0)
NRBC: 0 % (ref 0.0–0.2)
Platelets: 102 10*3/uL — ABNORMAL LOW (ref 150–400)
RBC: 3.61 MIL/uL — ABNORMAL LOW (ref 3.87–5.11)
RDW: 15.5 % (ref 11.5–15.5)
WBC: 7.5 10*3/uL (ref 4.0–10.5)

## 2018-07-31 LAB — MAGNESIUM: Magnesium: 2 mg/dL (ref 1.7–2.4)

## 2018-07-31 LAB — CULTURE, RESPIRATORY

## 2018-07-31 LAB — PHOSPHORUS: Phosphorus: 2.7 mg/dL (ref 2.5–4.6)

## 2018-07-31 MED ORDER — ENSURE ENLIVE PO LIQD
237.0000 mL | Freq: Three times a day (TID) | ORAL | Status: DC
  Administered 2018-08-01: 237 mL via ORAL

## 2018-07-31 MED ORDER — SERTRALINE HCL 100 MG PO TABS
100.0000 mg | ORAL_TABLET | Freq: Every day | ORAL | Status: DC
  Administered 2018-07-31 – 2018-08-02 (×3): 100 mg via ORAL
  Filled 2018-07-31 (×3): qty 1

## 2018-07-31 MED ORDER — LORAZEPAM 0.5 MG PO TABS: 0.2500 mg | ORAL_TABLET | Freq: Four times a day (QID) | ORAL | Status: DC | PRN

## 2018-07-31 MED ORDER — DIVALPROEX SODIUM 125 MG PO CSDR
125.0000 mg | DELAYED_RELEASE_CAPSULE | Freq: Every day | ORAL | Status: DC
  Administered 2018-07-31 – 2018-08-02 (×3): 125 mg via ORAL
  Filled 2018-07-31 (×3): qty 1

## 2018-07-31 MED ORDER — ENOXAPARIN SODIUM 40 MG/0.4ML ~~LOC~~ SOLN
40.0000 mg | SUBCUTANEOUS | Status: DC
  Administered 2018-07-31 – 2018-08-01 (×2): 40 mg via SUBCUTANEOUS
  Filled 2018-07-31 (×2): qty 0.4

## 2018-07-31 MED ORDER — METOPROLOL TARTRATE 12.5 MG HALF TABLET
12.5000 mg | ORAL_TABLET | Freq: Two times a day (BID) | ORAL | Status: DC
  Administered 2018-07-31 – 2018-08-02 (×5): 12.5 mg via ORAL
  Filled 2018-07-31 (×5): qty 1

## 2018-07-31 MED ORDER — ADULT MULTIVITAMIN W/MINERALS CH
1.0000 | ORAL_TABLET | Freq: Every day | ORAL | Status: DC
  Administered 2018-07-31 – 2018-08-02 (×3): 1 via ORAL
  Filled 2018-07-31 (×3): qty 1

## 2018-07-31 MED ORDER — RESOURCE THICKENUP CLEAR PO POWD
ORAL | Status: DC | PRN
  Filled 2018-07-31: qty 125

## 2018-07-31 NOTE — Progress Notes (Signed)
Patient received from [facility] ICU, alert and oriented x2.  Vital signs stable. Oriented to room and call bell. Will

## 2018-07-31 NOTE — Plan of Care (Signed)
  Problem: Clinical Measurements: Goal: Will remain free from infection Outcome: Progressing Goal: Diagnostic test results will improve Outcome: Progressing Goal: Respiratory complications will improve Outcome: Progressing   Problem: Activity: Goal: Risk for activity intolerance will decrease Outcome: Progressing   Problem: Nutrition: Goal: Adequate nutrition will be maintained Outcome: Progressing   Problem: Coping: Goal: Level of anxiety will decrease Outcome: Progressing   Problem: Elimination: Goal: Will not experience complications related to bowel motility Outcome: Progressing Goal: Will not experience complications related to urinary retention Outcome: Progressing   Problem: Safety: Goal: Ability to remain free from injury will improve Outcome: Progressing   Problem: Skin Integrity: Goal: Risk for impaired skin integrity will decrease Outcome: Progressing

## 2018-07-31 NOTE — Progress Notes (Addendum)
PROGRESS NOTE    [name]  OZH:[id number] DOB: [birth year] DOA: 07/28/2018 PCP: Janifer Adie, MD    Brief Narrative:  85 year old female who presented with choking.  She does have significant past medical history for dysphasia.  Apparently she was found choking after eating a cheese sandwich.  She was intubated on the field, and the emergency department she was hypoxemic and hypotensive.  Patient underwent bronchoscopy in the emergency department for foreign object extraction.  Her blood pressure was 100/67, heart rate 91, respirate 16, oxygen saturation 100% on PRVC mechanical ventilation.  Lungs have diffuse rhonchi, heart is also present regular, abdomen soft, no lower extremity edema.   Patient was admitted to the hospital working diagnosis of acute hypoxic respiratory failure due to foregin object aspiration.   Assessment & Plan:   Active Problems:   Dementia without behavioral disturbance (HCC)   Aspiration of foreign body in respiratory tract   Pressure injury of skin   Palliative care by specialist   Hypoxemia   Pneumonia due to infectious organism   Malnutrition of moderate degree  1. Acute hypoxic respiratory failure, complicated with aspiration pneumonia/ pneumonitis.  Continue antibiotic therapy with IV Unasyn. Patient with no dyspnea, no fever or leukocytosis. Will continue oxymetry monitoring and supplemental 02 per [place], to target 02 saturation greater than 92%,. Chest film personally reviewed from 07/30/18, noted rotation to the left and bilateral pleural effusions, the right upper love volume loss that was present on admission had resolved. Will hold on antibiotic therapy and continue close monitoring. Follow chest film in am.   2. Septic shock due to aspiration pneumonia, right upper lobe, present on admission. Patient off vasopressors, for more than 24 H, will continue hemodynamic monitoring with vital signs. Discontinue IV fluids, patient is tolerating po  feedings well.   3. Hyperglycemia. Fasting glucose this am at 159, will continue insulin sliding scale for glucose cover and monitoring. Now patient is allowed to eat by mouth with aspiration precautions.    4. Moderate calorie protein malnutrition. On Nutritional supplements.   5. Dementia. Will resume patient's meds includinf depakote, lorazepam, risperidone, and sertraline at home.   6, HTN. Systolic blood pressure 629 to 138, will continue close monitoring, continue holding on metoprolol.  7. Pressure ulcer stage 2, present on admission, at the right coccyx. Continue local wound care.   8. Calorie protein malnutrition, moderate. Continue with nutritional supplements.   DVT prophylaxis: enoxaparin   Code Status:  dnr  Family Communication:  No family at the bedside  Disposition Plan/ discharge barriers: pending clinical improvement   Body mass index is 22.29 kg/m. Malnutrition Type:  Nutrition Problem: Moderate Malnutrition Etiology: dysphagia, wound healing, chronic illness(Dementia, Swallowing issues, )   Malnutrition Characteristics:  Signs/Symptoms: moderate fat depletion, severe muscle depletion   Nutrition Interventions:  Interventions: Refer to RD note for recommendations  RN Pressure Injury Documentation: Pressure Injury 07/28/18 Stage II -  Partial thickness loss of dermis presenting as a shallow open ulcer with a red, pink wound bed without slough. (Active)  07/28/18 2100  Location: Coccyx  Location Orientation: Right;Left  Staging: Stage II -  Partial thickness loss of dermis presenting as a shallow open ulcer with a red, pink wound bed without slough.  Wound Description (Comments):   Present on Admission: Yes     Consultants:     Procedures:     Antimicrobials:       Subjective: Patient is more awake today, continue to be confused and  disorientated, denies any chest pain or dyspnea.   Objective: Vitals:   07/31/18 0000 07/31/18  0001 07/31/18 0100 07/31/18 0210  BP: 134/65  114/62 138/64  Pulse: (!) 102  (!) 104 (!) 102  Resp: _0 Temp:  98.5 F (36.9 C)  (!) 97.3 F (36.3 C)  TempSrc:  Axillary  Axillary  SpO2: 98%  100% 99%  Weight:    53.5 kg  Height:        Intake/Output Summary (Last 24 hours) at 07/31/2018 1349 Last data filed at 07/31/2018 1257 Gross per 24 hour  Intake 1652.89 ml  Output 700 ml  Net 952.89 ml   Filed Weights   07/28/18 1810 07/31/18 0210  Weight: 55 kg 53.5 kg    Examination:   General: deconditioned  Neurology: Awake and alert, non focal. Confused and disorientated.  E ENT: mild pallor, no icterus, oral mucosa moist Cardiovascular: No JVD. S1-S2 present, rhythmic, no gallops, rubs, or murmurs. No lower extremity edema. Pulmonary: positive breath sounds bilaterally, decreased ventilation at bases, no wheezing, rhonchi or rales. Gastrointestinal. Abdomen with no organomegaly, non tender, no rebound or guarding Skin. No rashes Musculoskeletal: no joint deformities     Data Reviewed: I have personally reviewed following labs and imaging studies  CBC: Recent Labs  Lab 07/28/18 1810 07/28/18 2112 07/29/18 0417 07/30/18 0655 07/31/18 0225  WBC 7.8  --  20.0* 10.4 7.5  HGB 13.4 13.3 12.2 10.6* 11.1*  HCT 42.7 39.0 38.8 35.2* 36.7  MCV 101.4*  --  100.3* 102.3* 101.7*  PLT 162  --  144* PLATELET CLUMPS NOTED ON SMEAR, COUNT APPEARS DECREASED 620*   Basic Metabolic Panel: Recent Labs  Lab 07/28/18 1828 07/28/18 2112 07/29/18 0417 07/30/18 0655 07/31/18 0225  NA 137 139 141 141 141  K 4.7 3.9 4.6 3.8 4.6  CL 100  --  108 113* 114*  CO2 26  --  24 24 21*  GLUCOSE 309*  --  244* 108* 159*  BUN 20  --  _1 CREATININE 1.05*  --  1.08* 0.89 0.78  CALCIUM 9.0  --  8.2* 7.6* 7.9*  MG  --   --  1.3* 2.2 2.0  PHOS  --   --  3.0 2.6 2.7   GFR: Estimated Creatinine Clearance: 32.4 mL/min (by C-G formula based on SCr of 0.78 mg/dL). Liver Function  Tests: Recent Labs  Lab 07/28/18 1828  AST 23  ALT 12  ALKPHOS 68  BILITOT 0.4  PROT 6.5  ALBUMIN 2.9*   No results for input(s): LIPASE, AMYLASE in the last 168 hours. No results for input(s): AMMONIA in the last 168 hours. Coagulation Profile: No results for input(s): INR, PROTIME in the last 168 hours. Cardiac Enzymes: No results for input(s): CKTOTAL, CKMB, CKMBINDEX, TROPONINI in the last 168 hours. BNP (last 3 results) No results for input(s): PROBNP in the last 8760 hours. HbA1C: No results for input(s): HGBA1C in the last 72 hours. CBG: Recent Labs  Lab 07/30/18 2001 07/30/18 2358 07/31/18 0406 07/31/18 0806 07/31/18 1224  GLUCAP 70 98 106* 81 145*   Lipid Profile: Recent Labs    07/28/18 1815  TRIG 134   Thyroid Function Tests: No results for input(s): TSH, T4TOTAL, FREET4, T3FREE, THYROIDAB in the last 72 hours. Anemia Panel: No results for input(s): VITAMINB12, FOLATE, FERRITIN, TIBC, IRON, RETICCTPCT in the last 72 hours.    Radiology Studies: I have reviewed all of the imaging during this  hospital visit personally     Scheduled Meds: . chlorhexidine gluconate (MEDLINE KIT)  15 mL Mouth Rinse BID  . heparin  5,000 Units Subcutaneous Q8H  . insulin aspart  0-15 Units Subcutaneous Q4H  . mouth rinse  15 mL Mouth Rinse BID   Continuous Infusions: . sodium chloride 75 mL/hr at 07/31/18 0600  . sodium chloride    . ampicillin-sulbactam (UNASYN) IV Stopped (07/31/18 0547)  . phenylephrine (NEO-SYNEPHRINE) Adult infusion Stopped (07/30/18 1404)     LOS: 3 days        Supreme Rybarczyk Gerome Apley, MD

## 2018-07-31 NOTE — Social Work (Signed)
CSW acknowledging palliative care services referral, have called [facility] to make them aware of recommendation. Spoke with Lelon Frohlich at [phone]. They will make pt full care team aware and continue to follow pt at SNF.  Westley Hummer, MSW, [place] Work [phone]

## 2018-07-31 NOTE — Progress Notes (Signed)
Pharmacy Antibiotic Note  [name] is a 85 y.o. female admitted on 07/28/2018 with pneumonia.  Pharmacy has been consulted for Unasn dosing.  ID: abx for aspiration. WBC 7.5, currently AF. 2/12 CXR new L>R LL consolidation, possible small pleural effusions. Scr WNL  Zosyn 2/10 >> 2/11 Unasyn 2/11 >>  2/10 MRSA PCR: neg 2/10 Bcx: NG x2 2/10 TA cx: normal flora   Plan Unasyn 3g IV q12h  Pharmacy will sign off. Please reconsult for further dosing assitance.   Height: 5\' 1"  (154.9 cm) Weight: 117 lb 15.1 oz (53.5 kg) IBW/kg (Calculated) : 47.8  Temp (24hrs), Avg:97.1 F (36.2 C), Min:96 F (35.6 C), Max:98.5 F (36.9 C)  Recent Labs  Lab 07/28/18 1810 07/28/18 1828 07/29/18 0417 07/30/18 0655 07/31/18 0225  WBC 7.8  --  20.0* 10.4 7.5  CREATININE  --  1.05* 1.08* 0.89 0.78    Estimated Creatinine Clearance: 32.4 mL/min (by C-G formula based on SCr of 0.78 mg/dL).    Allergies  Allergen Reactions  . Fluticasone-Salmeterol Other (See Comments)    Per MAR  . Statins Other (See Comments)    Per MAR  . Sulfa Antibiotics Other (See Comments)    Per MAR     Jaci Desanto S. Alford Highland, PharmD, [place] Clinical Staff Pharmacist [place], [place] 07/31/2018 11:20 AM

## 2018-07-31 NOTE — Progress Notes (Signed)
[place] meeting with daughter changed from 1pm to 1:45pm today. Thank you.  NO CHARGE  Ihor Dow, FNP-C Palliative Medicine Team  Phone: [phone] Fax: [phone]

## 2018-07-31 NOTE — Progress Notes (Signed)
Nutrition Follow-up  DOCUMENTATION CODES:   Non-severe (moderate) malnutrition in context of chronic illness  INTERVENTION:   -Ensure Enlive po TID, each supplement provides 350 kcal and 20 grams of protein -Magic cup BID with meals, each supplement provides 290 kcal and 9 grams of protein -MVI with minerals daily  NUTRITION DIAGNOSIS:   Moderate Malnutrition related to dysphagia, wound healing, chronic illness(Dementia, Swallowing issues, ) as evidenced by moderate fat depletion, severe muscle depletion.  Ongoing  GOAL:   Patient will meet greater than or equal to 90% of their needs  Progressing  MONITOR:   Diet advancement, PO intake, Supplement acceptance, Weight trends, Skin  REASON FOR ASSESSMENT:   Malnutrition Screening Tool Malnutrition Eval  ASSESSMENT:   Pt is 70y F from SNF with PMH CKD, GERD, DM2, Stroke, CAD, hyperlipidemia, Anemia, and mild dementia. Pt is now admitted for Choking and possible aspiration PNA, Pt also has a pressure injury.   2/12- s/p BSE- advanced to dysphagia 1 diet with nectar thick liquids   Reviewed I/O's: +2.1 L x 24 hours and +7.4 L since admission  Pt in with palliative care at time of visit. Plan for outpatient palliative care services at discharge. No plans to pursue feeding tube. Noted meal completion 50%.   RD will add supplements to help enhance intake now that diet is advanced.   Labs reviewed: CBGS: 81-159 (inpatient orders for glycemic control are 0-15 units insulin aspart every 4 hours).   Diet Order:   Diet Order            DIET - DYS 1 Room service appropriate? No; Fluid consistency: Nectar Thick  Diet effective now              EDUCATION NEEDS:   No education needs have been identified at this time  Skin:  Skin Assessment: Skin Integrity Issues: Skin Integrity Issues:: Stage II Stage II: Coccyx  Last BM:  2/12  Height:   Ht Readings from Last 1 Encounters:  07/28/18 5\' 1"  (1.549 m)    Weight:    Wt Readings from Last 1 Encounters:  07/31/18 53.5 kg    Ideal Body Weight:  47.7 kg  BMI:  Body mass index is 22.29 kg/m.  Estimated Nutritional Needs:   Kcal:  1400-1700  Protein:  70-85  Fluid:  >1.5l    Friend Dorfman A. Jimmye Norman, RD, LDN, CDE Pager: [phone] After hours Pager: [phone]

## 2018-07-31 NOTE — Progress Notes (Signed)
  Speech Language Pathology Treatment: Dysphagia  Patient Details Name: [name] MRN: [id number] DOB: 1922/01/10 Today's Date: 07/31/2018 Time: 0569-7948 SLP Time Calculation (min) (ACUTE ONLY): 30 min  Assessment / Plan / Recommendation Clinical Impression  Pt seen for PO readiness and was overall much more accepting of PO intake. Nectar-thick liquids consumed with no s/sx of aspiration demonstrated by dry vocal quality and no coughing. Frequent wet coughing observed with puree intake, which may be indicative of excess pharyngeal residue. Pt given mod verbal cues to cough and use a liquid wash to clear any possible residue, after which dry vocal quality returned. Given decreased coughing and dry vocal quality with use of compensatory strategies, recommend Dys 1, nectar-thick liquid diet. Pt requires full supervision with meals to use compensatory strategies and help with suction. SLP to f/u for tolerance and use of strategies.   HPI HPI: Pt is a 85 y.o female SNF resident with PMH pf dementia, asthma, CKD, CAD, HTN, DM2, GERD and dysphagia admitted to ED (2/10) after choking on grilled cheese sandwhich. Intubated 2/10- 2/11      SLP Plan  Continue with current plan of care       Recommendations  Diet recommendations: Nectar-thick liquid;Dysphagia 1 (puree) Liquids provided via: Straw Medication Administration: Crushed with puree Supervision: Full supervision/cueing for compensatory strategies;Staff to assist with self feeding Compensations: Small sips/bites;Follow solids with liquid;Hard cough after swallow Postural Changes and/or Swallow Maneuvers: Seated upright 90 degrees                Oral Care Recommendations: Oral care BID Follow up Recommendations: Skilled Nursing facility SLP Visit Diagnosis: Dysphagia, unspecified (R13.10) Plan: Continue with current plan of care       GO                Izzy Abbeygail Igoe. SLP Student 07/31/2018, 1:54 PM

## 2018-07-31 NOTE — Progress Notes (Signed)
Daily Progress Note   Patient Name: [name]       Date: 07/31/2018 DOB: 1922/03/23  Age: 85 y.o. MRN#: [id number] Attending Physician: Tawni Millers Primary Care Physician: Janifer Adie, MD Admit Date: 07/28/2018  Reason for Consultation/Follow-up: Establishing goals of care  Subjective: Patient awake and alert to person and place. She is pleasantly confused with baseline dementia. She is asking for Pepsi and to get out of bed. She denies pain or discomfort.   GOC:   F/u GOC with daughter Izora Gala) and son-in-law Ulice Dash) in patient's room.   Again introduced Palliative Medicine as specialized medical care for people living with serious illness. It focuses on providing relief from the symptoms and stress of a serious illness. The goal is to improve quality of life for both the patient and the family.  Officially diagnosed with dementia ~1 year ago. Leading up to hospitalization, baseline wheelchair bound but able to scoot herself around SNF. She has been on pureed diet due to choking episodes. She requires assist with feeding or she eats too quickly. Recently, she has been intermittently refusing to take medications in applesauce.   Discussed events leading up to admission and course of hospitalization including diagnoses and interventions. Discussed disease trajectory of dementia and high risk for recurrent hospitalization secondary to dysphagia, infection, dehydration, or poor oral intake.   I attempted to elicit values and goals of care important to the patient and family. Izora Gala further shares her mother's frustrations with hearing and vision loss. Prior to dementia, always a very stubborn, ornery individual. Izora Gala shares that one of her mother's only joys now is eating and  that she may not do well with the dysphagia diet.   Advanced directives, concepts specific to code status, artifical feeding and hydration, and rehospitalization were considered and discussed. Introduced and completed MOST form. Izora Gala reports she is POA (documentation requested for records) and child most actively involved in her mother's care. Another daughter is estranged and two other children deceased.   Educated on medical recommendation against heroic interventions at EOL with underlying age, frailty, co-morbidities including dementia, and poor chance of survival if resuscitated. Educated on medical recommendation against feeding tube in dementia patients.   MOST form completed with following patient/family wishes: DNR/DNI, rehospitalization for limited interventions including BiPAP/CPAP if indicated, ABX for  time trial, IVF if indicated, and NO feeding tube. Durable DNR completed. Copies of MOST and Durable DNR made for family.   Palliative Care and hospice services utpatient were explained and offered. Patient [facility] by [facility]. Encouraged daughter to discuss outpatient palliative referral with [facility] MD if she is interested. Discussed support that hospice can provide at SNF when her mother's dementia continues to progress and focus is on comfort and preventing re-hospitalization. Discussed quality vs quantity of life.  Questions and concerns were addressed.  Hard Choices booklet left for review. PMT contact information given. Therapeutic listening and emotional support provided.    Length of Stay: 3  Current Medications: Scheduled Meds:  . chlorhexidine gluconate (MEDLINE KIT)  15 mL Mouth Rinse BID  . divalproex  125 mg Oral Q1200  . heparin  5,000 Units Subcutaneous Q8H  . insulin aspart  0-15 Units Subcutaneous Q4H  . mouth rinse  15 mL Mouth Rinse BID  . metoprolol tartrate  12.5 mg Oral BID  . sertraline  100 mg Oral Daily    Continuous Infusions: . sodium chloride 75  mL/hr at 07/31/18 0600  . sodium chloride    . ampicillin-sulbactam (UNASYN) IV Stopped (07/31/18 0547)  . phenylephrine (NEO-SYNEPHRINE) Adult infusion Stopped (07/30/18 1404)    PRN Meds: sodium chloride, LORazepam, RESOURCE THICKENUP CLEAR  Physical Exam Vitals signs and nursing note reviewed.  Constitutional:      General: She is awake.     Appearance: She is cachectic. She is ill-appearing.  HENT:     Head: Normocephalic and atraumatic.  Pulmonary:     Effort: No tachypnea, accessory muscle usage or respiratory distress.  Abdominal:     Tenderness: There is no abdominal tenderness.  Skin:    General: Skin is warm and dry.     Coloration: Skin is pale.  Neurological:     Mental Status: She is alert.     Comments: Oriented to person/place. Intermittent pleasant confusion with baseline dementia.  Psychiatric:        Mood and Affect: Mood normal.        Speech: Speech normal.        Cognition and Memory: Cognition is impaired.            Vital Signs: BP 135/76 (BP Location: Right Arm)   Pulse (!) 101   Temp 97.8 F (36.6 C) (Axillary)   Resp 20   Ht 5' 1" (1.549 m)   Wt 53.5 kg   SpO2 94%   BMI 22.29 kg/m  SpO2: SpO2: 94 % O2 Device: O2 Device: Nasal Cannula O2 Flow Rate: O2 Flow Rate (L/min): 2 L/min  Intake/output summary:   Intake/Output Summary (Last 24 hours) at 07/31/2018 1601 Last data filed at 07/31/2018 1543 Gross per 24 hour  Intake 2124.59 ml  Output 700 ml  Net 1424.59 ml   LBM: Last BM Date: 07/30/18 Baseline Weight: Weight: 55 kg Most recent weight: Weight: 53.5 kg       Palliative Assessment/Data: PPS 40%   Flowsheet Rows     Most Recent Value  Intake Tab  Referral Department  Critical care  Unit at Time of Referral  Med/Surg Unit  Palliative Care Primary Diagnosis  Neurology  Date Notified  07/29/18  Palliative Care Type  New Palliative care  Reason for referral  Clarify Goals of Care  Date of Admission  07/28/18  Date first  seen by Palliative Care  07/30/18  # of days Palliative referral response time  1 Day(s)  # of days IP prior to Palliative referral  1  Clinical Assessment  Palliative Performance Scale Score  40%  Psychosocial & Spiritual Assessment  Palliative Care Outcomes  Patient/Family meeting held?  Yes  Who was at the meeting?  daughter, son-in-law  Palliative Care Outcomes  Clarified goals of care, Provided end of life care assistance, Provided psychosocial or spiritual support, ACP counseling assistance, Counseled regarding hospice, Provided advance care planning, Linked to palliative care logitudinal support, Completed durable DNR      Patient Active Problem List   Diagnosis Date Noted  . Malnutrition of moderate degree 07/30/2018  . Palliative care by specialist   . Hypoxemia   . Pneumonia due to infectious organism   . Pressure injury of skin 07/29/2018  . Aspiration of foreign body in respiratory tract 07/28/2018  . Bacteriuria with Klebsiella pneumoniae 11/17/2017  . DCM (dilated cardiomyopathy) ([place])   . NSTEMI (non-ST elevated myocardial infarction) ([place]) 11/13/2017  . Bruising 08/18/2014  . HOH (hard of hearing) 08/18/2014  . CAD (coronary artery disease)   . CKD (chronic kidney disease), stage III ([place])   . Dementia without behavioral disturbance ([place])   . Thrombocytopenia ([place])   . Anemia   . Cardiogenic shock ([place])   . ST elevation myocardial infarction (STEMI) involving right coronary artery with complication ([place]) 46/96/2952  . Fall 02/09/2013  . DYSPNEA ON EXERTION 07/07/2009  . URINARY INCONTINENCE 07/07/2009  . Essential hypertension, benign 04/12/2009  . VERTIGO 11/22/2008  . CONSTIPATION 03/31/2007  . DIABETES MELLITUS, II, COMPLICATIONS 84/13/2440  . HYPERCHOLESTEROLEMIA 08/15/2006  . OBESITY, NOS 08/15/2006  . DEPRESSION, MAJOR, RECURRENT 08/15/2006  . MACULAR DEGENERATION 08/15/2006  . MYOCARDIAL INFARCTION, OLD 08/15/2006  . CLAUDICATION, INTERMITTENT  08/15/2006  . RHINITIS, ALLERGIC 08/15/2006  . GASTROESOPHAGEAL REFLUX, NO ESOPHAGITIS 08/15/2006  . OSTEOARTHRITIS OF SPINE, NOS 08/15/2006    Palliative Care Assessment & Plan   Patient Profile: 85 y.o. female  with past medical history of dementia, CKD, CAD s/p stents, HTN, DM, GERD admitted on 07/28/2018 after choking on grilled cheese sandwich. In ED, patient hypoxemic and hypotensive. Pulmonary consulted in ED and emergent bronchoscopy performed with food retrieval. Hypoxemia improved s/p bronch. Intubated on arrival. Extubated 2/11. Palliative medicine consultation for goals of care.    Assessment: Acute hypoxic respiratory failure  Aspiration pneumonia Septic shock Moderate protein calorie malnutrition Dementia Hypertension  Recommendations/Plan:  GOC discussion with daughter, Izora Gala Via.   MOST form completed. DNR/DNI, limited interventions including rehospitalization/BiPAP/CPAP if indicated, IVF if indicated, ABX for time trial, NO feeding tube. Durable DNR completed. Copies made for family.   Discussed outpatient palliative versus hospice options. Daughter interested in outpatient palliative referral. Patient followed by [facility]. SW notified for outpatient palliative referral but also encouraged daughter to discuss with [facility] provider.   Code Status: DNR   Code Status Orders  (From admission, onward)         Start     Ordered   07/28/18 2046  Do not attempt resuscitation (DNR)  Continuous    Question Answer Comment  In the event of cardiac or respiratory ARREST Do not call a "code blue"   In the event of cardiac or respiratory ARREST Do not perform Intubation, CPR, defibrillation or ACLS   In the event of cardiac or respiratory ARREST Use medication by any route, position, wound care, and other measures to relive pain and suffering. May use oxygen, suction and manual treatment of airway obstruction as needed for comfort.  Comments This was requested by patient's  daughter, [name], who has POA.      07/28/18 2045        Code Status History    Date Active Date Inactive Code Status Order ID Comments User Context   07/28/2018 1904 07/28/2018 2045 DNR [id number]  Margaretha Seeds, MD ED   11/15/2017 1010 11/17/2017 1718 DNR [id number]  Sueanne Margarita, MD Inpatient   11/13/2017 2335 11/15/2017 1010 Full Code [id number]  Alphonzo Grieve, MD ED   01/15/2014 0739 01/15/2014 1431 DNR [id number]  Martinique, Peter M, MD Inpatient    Advance Directive Documentation     Most Recent Value  Type of Advance Directive  Healthcare Power of [place], Out of facility DNR (pink MOST or yellow form)  Pre-existing out of facility DNR order (yellow form or pink MOST form)  Yellow form placed in chart (order not valid for inpatient use)  "MOST" Form in Place?  -       Prognosis:   Unable to determine  Discharge Planning:  Back to long-term care facility  Care plan was discussed with patient, daughter Izora Gala), SIL, RN  Thank you for allowing the Palliative Medicine Team to assist in the care of this patient.   Time In: 1400 Time Out: 1525 Total Time 85 Prolonged Time Billed  yes      Greater than 50%  of this time was spent counseling and coordinating care related to the above assessment and plan.  Ihor Dow, FNP-C Palliative Medicine Team  Phone: [phone] Fax: [phone]  Please contact Palliative Medicine Team phone at [phone] for questions and concerns.

## 2018-07-31 NOTE — Care Management Important Message (Signed)
Important Message  Patient Details  Name: [name] MRN: [id number] Date of Birth: Apr 26, 1922   Medicare Important Message Given:  Yes    [name] Montine Circle 07/31/2018, 2:58 PM

## 2018-07-31 NOTE — Social Work (Addendum)
CSW aware pt from [place], PMT meeting at [place] today.   Westley Hummer, MSW, [place] Work [phone]

## 2018-08-01 ENCOUNTER — Inpatient Hospital Stay (HOSPITAL_COMMUNITY): Payer: Medicare (Managed Care)

## 2018-08-01 DIAGNOSIS — J189 Pneumonia, unspecified organism: Secondary | ICD-10-CM

## 2018-08-01 DIAGNOSIS — J90 Pleural effusion, not elsewhere classified: Secondary | ICD-10-CM

## 2018-08-01 LAB — GLUCOSE, CAPILLARY
GLUCOSE-CAPILLARY: 72 mg/dL (ref 70–99)
Glucose-Capillary: 82 mg/dL (ref 70–99)
Glucose-Capillary: 93 mg/dL (ref 70–99)
Glucose-Capillary: 117 mg/dL — ABNORMAL HIGH (ref 70–99)
Glucose-Capillary: 152 mg/dL — ABNORMAL HIGH (ref 70–99)

## 2018-08-01 LAB — BASIC METABOLIC PANEL
ANION GAP: 5 (ref 5–15)
BUN: 8 mg/dL (ref 8–23)
CO2: 21 mmol/L — ABNORMAL LOW (ref 22–32)
Calcium: 8.2 mg/dL — ABNORMAL LOW (ref 8.9–10.3)
Chloride: 116 mmol/L — ABNORMAL HIGH (ref 98–111)
Creatinine, Ser: 0.67 mg/dL (ref 0.44–1.00)
GFR calc Af Amer: >60 mL/min (ref >60)
GFR calc non Af Amer: >60 mL/min (ref >60)
Glucose, Bld: 84 mg/dL (ref 70–99)
Potassium: 3.6 mmol/L (ref 3.5–5.1)
Sodium: 142 mmol/L (ref 135–145)

## 2018-08-01 MED ORDER — ADULT MULTIVITAMIN W/MINERALS CH: 1.0000 | ORAL_TABLET | Freq: Every day | ORAL | 0 refills | Status: AC

## 2018-08-01 MED ORDER — METOPROLOL TARTRATE 25 MG PO TABS: 12.5000 mg | ORAL_TABLET | Freq: Two times a day (BID) | ORAL | 0 refills | Status: AC

## 2018-08-01 MED ORDER — ENSURE ENLIVE PO LIQD: 237.0000 mL | Freq: Three times a day (TID) | ORAL | 0 refills | Status: AC

## 2018-08-01 MED ORDER — FUROSEMIDE 10 MG/ML IJ SOLN
40.0000 mg | Freq: Once | INTRAMUSCULAR | Status: AC
  Administered 2018-08-01: 40 mg via INTRAVENOUS
  Filled 2018-08-01: qty 4

## 2018-08-01 MED ORDER — LORAZEPAM 0.5 MG PO TABS: 0.2500 mg | ORAL_TABLET | Freq: Four times a day (QID) | ORAL | 0 refills | Status: AC | PRN

## 2018-08-01 NOTE — Social Work (Addendum)
1:10pm- Called pt [facility] CSW, pt not discharging today. CSW also updated pt, pt MD will call pt daughter.  12:05pm- CSW spoke with pt [facility] CSW Lelon Frohlich, she will arrange pick up for pt today. Await d/c summary. Pt will return to [facility].  Westley Hummer, MSW, [place] Work [phone]

## 2018-08-01 NOTE — NC FL2 (Signed)
[place] LEVEL OF CARE SCREENING TOOL     IDENTIFICATION  Patient Name: [name] Birthdate: 12-Feb-1922 Sex: female Admission Date (Current Location): 07/28/2018  [facility] and [place] Number:  Herbalist and Address:  [facility], Olivia Lopez de Gutierrez [address], [place], [place] [address]      Provider Number: [id number]  Attending Physician Name and Address:  Tawni Millers,*  Relative Name and Phone Number:  [name]; daughter; [phone]    Current Level of Care: Hospital Recommended Level of Care: [place] Prior Approval Number:    Date Approved/Denied:   PASRR Number: [id number]  Discharge Plan: SNF    Current Diagnoses: Patient Active Problem List   Diagnosis Date Noted  . Goals of care, counseling/discussion   . Dysphagia   . DNR (do not resuscitate)   . Malnutrition of moderate degree 07/30/2018  . Palliative care by specialist   . Hypoxemia   . Pneumonia due to infectious organism   . Pressure injury of skin 07/29/2018  . Aspiration of foreign body in respiratory tract 07/28/2018  . Bacteriuria with Klebsiella pneumoniae 11/17/2017  . DCM (dilated cardiomyopathy) ([place])   . NSTEMI (non-ST elevated myocardial infarction) ([place]) 11/13/2017  . Bruising 08/18/2014  . HOH (hard of hearing) 08/18/2014  . CAD (coronary artery disease)   . CKD (chronic kidney disease), stage III ([place])   . Dementia without behavioral disturbance ([place])   . Thrombocytopenia ([place])   . Anemia   . Cardiogenic shock ([place])   . ST elevation myocardial infarction (STEMI) involving right coronary artery with complication ([place]) 47/65/4650  . Fall 02/09/2013  . DYSPNEA ON EXERTION 07/07/2009  . URINARY INCONTINENCE 07/07/2009  . Essential hypertension, benign 04/12/2009  . VERTIGO 11/22/2008  . CONSTIPATION 03/31/2007  . DIABETES MELLITUS, II, COMPLICATIONS 35/46/5681  . HYPERCHOLESTEROLEMIA 08/15/2006  . OBESITY, NOS  08/15/2006  . DEPRESSION, MAJOR, RECURRENT 08/15/2006  . MACULAR DEGENERATION 08/15/2006  . MYOCARDIAL INFARCTION, OLD 08/15/2006  . CLAUDICATION, INTERMITTENT 08/15/2006  . RHINITIS, ALLERGIC 08/15/2006  . GASTROESOPHAGEAL REFLUX, NO ESOPHAGITIS 08/15/2006  . OSTEOARTHRITIS OF SPINE, NOS 08/15/2006    Orientation RESPIRATION BLADDER Height & Weight     Self, Place  O2(nasal canula 2L) Continent, External catheter Weight: 117 lb 15.1 oz (53.5 kg) Height:  5\' 1"  (154.9 cm)  BEHAVIORAL SYMPTOMS/MOOD NEUROLOGICAL BOWEL NUTRITION STATUS      Continent Diet(Nectar-thick liquid;Dysphagia 1 (puree); full supervision by staff when feeding)  AMBULATORY STATUS COMMUNICATION OF NEEDS Skin   Extensive Assist Verbally PU Stage and Appropriate Care(abrasions bilaterally on arms and legs; MASD on perineum and buttocks)   PU Stage 2 Dressing: (foam dressing on coccyx to be changed every 3 days)                   Personal Care Assistance Level of Assistance  Bathing, Feeding, Dressing Bathing Assistance: Maximum assistance Feeding assistance: Maximum assistance Dressing Assistance: Maximum assistance     Functional Limitations Info  Sight, Hearing, Speech Sight Info: Impaired Hearing Info: Impaired Speech Info: Adequate    SPECIAL CARE FACTORS FREQUENCY                       Contractures Contractures Info: Not present    Additional Factors Info  Code Status, Allergies, Psychotropic, Insulin Sliding Scale Code Status Info: DNR Allergies Info: FLUTICASONE-SALMETEROL, STATINS, SULFA ANTIBIOTICS  Psychotropic Info: sertraline (ZOLOFT) tablet 100 mg daily PO Insulin Sliding Scale Info:  insulin aspart (novoLOG) injection 0-15 Units every 4 hours       Current Medications (08/01/2018):  This is the current hospital active medication list Current Facility-Administered Medications  Medication Dose Route Frequency Provider Last Rate Last Dose  . divalproex (DEPAKOTE SPRINKLE)  capsule 125 mg  125 mg Oral Q1200 Arrien, Jimmy Picket, MD   125 mg at 07/31/18 1525  . enoxaparin (LOVENOX) injection 40 mg  40 mg Subcutaneous Q24H Tawni Millers, MD   40 mg at 07/31/18 2219  . feeding supplement (ENSURE ENLIVE) (ENSURE ENLIVE) liquid 237 mL  237 mL Oral TID BM Arrien, Jimmy Picket, MD   237 mL at 08/01/18 1033  . insulin aspart (novoLOG) injection 0-15 Units  0-15 Units Subcutaneous Q4H Erick Colace, NP   3 Units at 07/31/18 1628  . LORazepam (ATIVAN) tablet 0.25 mg  0.25 mg Oral Q6H PRN Arrien, Jimmy Picket, MD      . MEDLINE mouth rinse  15 mL Mouth Rinse BID Erick Colace, NP   15 mL at 08/01/18 1033  . metoprolol tartrate (LOPRESSOR) tablet 12.5 mg  12.5 mg Oral BID Tawni Millers, MD   12.5 mg at 08/01/18 1033  . multivitamin with minerals tablet 1 tablet  1 tablet Oral Daily Arrien, Jimmy Picket, MD   1 tablet at 08/01/18 1033  . [place]   Oral PRN Arrien, Jimmy Picket, MD      . sertraline (ZOLOFT) tablet 100 mg  100 mg Oral Daily Arrien, Jimmy Picket, MD   100 mg at 08/01/18 1033     Discharge Medications: Please see discharge summary for a list of discharge medications.  Relevant Imaging Results:  Relevant Lab Results:   Additional Information SS#: [id number]  [place], [place]

## 2018-08-01 NOTE — Clinical Social Work Note (Signed)
**Note De-Identified Bailey Obfuscation** Clinical Social Work Assessment  Patient Details  Name: [name] MRN: [id number] Date of Birth: 01/04/1922  Date of referral:  08/01/18               Reason for consult:  Facility Placement                Permission sought to share information with:  Family Supports, Customer service manager, Other Permission granted to share information::  No(pt with fluctuating orientation)  Name::     [name]  Agency::  [facility] CSW  Relationship::  daughter  Contact Information:  [phone]  Housing/Transportation Living arrangements for the past 2 months:  [place] of Information:  Adult Children Patient Interpreter Needed:  None Criminal Activity/Legal Involvement Pertinent to Current Situation/Hospitalization:  No - Comment as needed Significant Relationships:  Adult Children, Community Support, Other Family Members, Other(Comment)([facility]) Lives with:  Facility Resident Do you feel safe going back to the place where you live?  Yes Need for family participation in patient care:  Yes (Comment)  Care giving concerns:  Pt from [facility], she has support through [facility]. Concern that SNF is not monitoring pt eating and that is why she choked. Pt daughter also feels let down by [facility] and SNF that they state they cannot provide palliative services without dis-enrolling pt.    Social Worker assessment / plan:  CSW spoke with pt daughter [name] telephone. Pt was one of the first participants in [facility]. Pt has multiple daughters and overall has positive support. She has been staying at [facility] and pt daughter states concern that pt is not being monitored while eating as is recommended. Pt daughter will reach out to the SNF regarding this and hopes MD can add to his note to have supervision while eating. Pt daughter also very upset regarding [facility] CSW and her lack of consistency with whether or not pt can have palliative care services at [facility].  Pt family feels that she should be able to have palliative following without having to unenroll pt from [facility] program completely. Pt daughter aware pt will be discharged back to [facility] and in the meantime she will f/u with [facility] and appropriate connects there.  Employment status:  Retired Forensic scientist:  Other (Comment Required)([facility]) PT Recommendations:  [place] / Referral to community resources:  [place]  Patient/Family's Response to care:  Pt family amenable to speaking with CSW on phone. She is amenable to pt returning to SNF, hopeful for pt to have more services.  Patient/Family's Understanding of and Emotional Response to Diagnosis, Current Treatment, and Prognosis:  Pt daughter states understanding of diagnosis, current treatment and prognosis. Pt daughter very aware that pt has progressive dementia and she strongly desires for pt to have palliative services. Provided emotional support and validation for concerns. Pt daughter sounds pleased with care here at hospital, she was frustrated but otherwise emotionally appropriate given circumstances.  Emotional Assessment Appearance:  Appears stated age Attitude/Demeanor/Rapport:  Unable to Assess Affect (typically observed):  Unable to Assess Orientation:  Oriented to Self, Oriented to Place, Fluctuating Orientation (Suspected and/or reported Sundowners) Alcohol / Substance use:  Not Applicable Psych involvement (Current and /or in the community):  Outpatient Provider  Discharge Needs  Concerns to be addressed:  Care Coordination Readmission within the last 30 days:  No Current discharge risk:  Cognitively Impaired, Dependent with Mobility, Physical Impairment Barriers to Discharge:  No Barriers Identified  [place], LCSWA 08/01/2018, 12:09 PM

## 2018-08-01 NOTE — Progress Notes (Signed)
PROGRESS NOTE    [name]  ZOX:[id number] DOB: 10/18/1921 DOA: 07/28/2018 PCP: Janifer Adie, MD    Brief Narrative:  85 year old female who presented with choking. She does have significant past medical history for dysphasia. Apparently she was found choking after eating a cheese sandwich. She was intubated on the field, and the emergency department she was hypoxemic and hypotensive. Patient underwent bronchoscopy in the emergency department for foreign object extraction. Her blood pressure was100/67, heart rate 91, respirate 16, oxygen saturation 100% on PRVC mechanical ventilation.Lungs have diffuse rhonchi, heart is also present regular, abdomen soft, no lower extremity edema.  Patient was admitted to the hospital working diagnosis of acute hypoxic respiratory failure due toforeginobject aspiration.   Assessment & Plan:   Active Problems:   Dementia without behavioral disturbance (HCC)   Aspiration of foreign body in respiratory tract   Pressure injury of skin   Palliative care by specialist   Hypoxemia   Pneumonia due to infectious organism   Malnutrition of moderate degree   Goals of care, counseling/discussion   Dysphagia   DNR (do not resuscitate)   1. Acute hypoxic respiratory failure, complicated with aspiration pneumonia/ pneumonitis.  follow chest film today with resolution of right upper lobe infiltrate but now has worsening bilateral pleural effusions and a right lowe lobe atelectasis. Will continue to hold on antibiotic therapy, possible volume overload due to fluid resuscitation, will give 40 mg of IV furosemide and will follow on echocardiogram. Add incentive spirometer and chest physical therapy. Continue oxymetry monitoring and supplemental 02 per [place]. Will cancel discharge for now. Follow chest film in am.   2. Septic shock due to aspiration pneumonia, right upper lobe, present on admission. Suspected patient is hypervolemic, will do a trial of  furosemide.   3. Hyperglycemia. patient is tolerating po well, will continue glucose cover and monitoring with iss.  4. Moderate calorie protein malnutrition. Continue with Nutritional supplements.   5. Dementia. Continue with depakote, lorazepam, risperidone, and sertraline at home, with good toleration.   6, HTN. Continue metoprolol for blood pressure control.  7. Pressure ulcer stage 2, present on admission, at the right coccyx. Local wound care. Frequent turning and out of the bed to the chair.    DVT prophylaxis:enoxaparin Code Status:dnr Family Communication:No family at the bedside Disposition Plan/ discharge barriers:pending clinical improvement  Body mass index is 22.29 kg/m. Malnutrition Type:  Nutrition Problem: Moderate Malnutrition Etiology: dysphagia, wound healing, chronic illness(Dementia, Swallowing issues, )   Malnutrition Characteristics:  Signs/Symptoms: moderate fat depletion, severe muscle depletion   Nutrition Interventions:  Interventions: Refer to RD note for recommendations  RN Pressure Injury Documentation: Pressure Injury 07/28/18 Stage II -  Partial thickness loss of dermis presenting as a shallow open ulcer with a red, pink wound bed without slough. (Active)  07/28/18 2100  Location: Coccyx  Location Orientation: Right;Left  Staging: Stage II -  Partial thickness loss of dermis presenting as a shallow open ulcer with a red, pink wound bed without slough.  Wound Description (Comments):   Present on Admission: Yes     Consultants:     Procedures:     Antimicrobials:       Subjective: Patient is feeling well, denies any chest pain or dyspnea, no cough, nausea or vomiting. The history is limited due to dementia.    Objective: Vitals:   07/31/18 1549 07/31/18 2007 08/01/18 0434 08/01/18 0801  BP: 135/76 121/86 (!) 112/56 121/61  Pulse: (!) 101 89 80 99  Resp: 20 20 18  (!) 22  Temp: 97.8 F (36.6 C) 97.8  F (36.6 C) (!) 97.5 F (36.4 C) (!) 97.5 F (36.4 C)  TempSrc: Axillary Axillary Axillary Oral  SpO2: 94% 98% 93% 100%  Weight:      Height:        Intake/Output Summary (Last 24 hours) at 08/01/2018 1313 Last data filed at 07/31/2018 1819 Gross per 24 hour  Intake 744.55 ml  Output 150 ml  Net 594.55 ml   Filed Weights   07/28/18 1810 07/31/18 0210  Weight: 55 kg 53.5 kg    Examination:   General: Not in pain or  dyspnea  Neurology: Awake and alert, non focal  E ENT: mild pallor, no icterus, oral mucosa moist Cardiovascular: No JVD. S1-S2 present, rhythmic, no gallops, rubs, or murmurs. No lower extremity edema. Pulmonary: positive breath sounds bilaterally, decreased breath sounds a bases, with no wheezing, rhonchi or rales. Gastrointestinal. Abdomen with no organomegaly, non tender, no rebound or guarding Skin. No rashes Musculoskeletal: no joint deformities   Data Reviewed: I have personally reviewed following labs and imaging studies  CBC: Recent Labs  Lab 07/28/18 1810 07/28/18 2112 07/29/18 0417 07/30/18 0655 07/31/18 0225  WBC 7.8  --  20.0* 10.4 7.5  HGB 13.4 13.3 12.2 10.6* 11.1*  HCT 42.7 39.0 38.8 35.2* 36.7  MCV 101.4*  --  100.3* 102.3* 101.7*  PLT 162  --  144* PLATELET CLUMPS NOTED ON SMEAR, COUNT APPEARS DECREASED 643*   Basic Metabolic Panel: Recent Labs  Lab 07/28/18 1828 07/28/18 2112 07/29/18 0417 07/30/18 0655 07/31/18 0225 08/01/18 0153  NA 137 139 141 141 141 142  K 4.7 3.9 4.6 3.8 4.6 3.6  CL 100  --  108 113* 114* 116*  CO2 26  --  24 24 21* 21*  GLUCOSE 309*  --  244* 108* 159* 84  BUN 20  --  19 12 10 8   CREATININE 1.05*  --  1.08* 0.89 0.78 0.67  CALCIUM 9.0  --  8.2* 7.6* 7.9* 8.2*  MG  --   --  1.3* 2.2 2.0  --   PHOS  --   --  3.0 2.6 2.7  --    GFR: Estimated Creatinine Clearance: 32.4 mL/min (by C-G formula based on SCr of 0.67 mg/dL). Liver Function Tests: Recent Labs  Lab 07/28/18 1828  AST 23  ALT 12    ALKPHOS 68  BILITOT 0.4  PROT 6.5  ALBUMIN 2.9*   No results for input(s): LIPASE, AMYLASE in the last 168 hours. No results for input(s): AMMONIA in the last 168 hours. Coagulation Profile: No results for input(s): INR, PROTIME in the last 168 hours. Cardiac Enzymes: No results for input(s): CKTOTAL, CKMB, CKMBINDEX, TROPONINI in the last 168 hours. BNP (last 3 results) No results for input(s): PROBNP in the last 8760 hours. HbA1C: No results for input(s): HGBA1C in the last 72 hours. CBG: Recent Labs  Lab 07/31/18 2000 07/31/18 2349 08/01/18 0432 08/01/18 0756 08/01/18 1211  GLUCAP 108* 90 82 72 93   Lipid Profile: No results for input(s): CHOL, HDL, LDLCALC, TRIG, CHOLHDL, LDLDIRECT in the last 72 hours. Thyroid Function Tests: No results for input(s): TSH, T4TOTAL, FREET4, T3FREE, THYROIDAB in the last 72 hours. Anemia Panel: No results for input(s): VITAMINB12, FOLATE, FERRITIN, TIBC, IRON, RETICCTPCT in the last 72 hours.    Radiology Studies: I have reviewed all of the imaging during this hospital visit personally     Scheduled  Meds: . divalproex  125 mg Oral Q1200  . enoxaparin (LOVENOX) injection  40 mg Subcutaneous Q24H  . feeding supplement (ENSURE ENLIVE)  237 mL Oral TID BM  . furosemide  40 mg Intravenous Once  . insulin aspart  0-15 Units Subcutaneous Q4H  . mouth rinse  15 mL Mouth Rinse BID  . metoprolol tartrate  12.5 mg Oral BID  . multivitamin with minerals  1 tablet Oral Daily  . sertraline  100 mg Oral Daily   Continuous Infusions:   LOS: 4 days        Que Meneely Gerome Apley, MD

## 2018-08-01 NOTE — Progress Notes (Addendum)
  Speech Language Pathology Treatment: Dysphagia  Patient Details Name: [name] MRN: [id number] DOB: 1922-05-22 Today's Date: 08/01/2018 Time: 0850-0900 SLP Time Calculation (min) (ACUTE ONLY): 10 min  Assessment / Plan / Recommendation Clinical Impression  Pt seen for dysphagia tx and possible diet advancement. Pt fed dys 1, nectar liquid breakfast this am prior to tx session. Upgraded thin liquid trial resulted in immediate wet cough and vocal quality even when provided mod verbal/tactile cues for compensatory strategies (small sips, hard cough). Consumption of nectar-thick liquids, though tolerated during session on 2/13, also resulted in immediate s/sx of aspiration. Pt refused suction when offered to clear aspirate/residue. Recommend continuation of dys 1, nectar liquid diet with full supervision during all meals. Further goals for care to be determined based on palliative care consult recommendations.   HPI HPI: Pt is a 85 y.o female SNF resident with PMH pf dementia, asthma, CKD, CAD, HTN, DM2, GERD and dysphagia admitted to ED (2/10) after choking on grilled cheese sandwhich. Intubated 2/10- 2/11      SLP Plan  Continue with current plan of care       Recommendations  Diet recommendations: Nectar-thick liquid;Dysphagia 1 (puree) Liquids provided via: Straw Medication Administration: Crushed with puree Supervision: Full supervision/cueing for compensatory strategies;Staff to assist with self feeding Compensations: Small sips/bites;Follow solids with liquid;Hard cough after swallow Postural Changes and/or Swallow Maneuvers: Seated upright 90 degrees                Oral Care Recommendations: Oral care BID Follow up Recommendations: Skilled Nursing facility SLP Visit Diagnosis: Dysphagia, unspecified (R13.10) Plan: Continue with current plan of care       GO                [name], Student SLP 08/01/2018, 9:24 AM

## 2018-08-01 NOTE — Evaluation (Signed)
Physical Therapy Evaluation Patient Details Name: [name] MRN: [id number] DOB: 01/27/1922 Today's Date: 08/01/2018   History of Present Illness  85 y.o. female  with past medical history of dementia, CKD, CAD s/p stents, HTN, DM, GERD admitted on 07/28/2018 after choking on grilled cheese sandwich. In ED, patient hypoxemic and hypotensive. Pulmonary consulted in ED and emergent bronchoscopy performed with food retrieval. Hypoxemia improved s/p bronch. Intubated on arrival. Extubated 2/11.  Clinical Impression  Pt from [facility] and per prior notes was transferred to w/c where she could independently propel herself throughout the facility. Pt currently is limited in safe mobility by decreased cognition and safety awareness, as well as generalized pain and weakness. PT recommending d/c back to SNF at d/c to work on wheelchair mobility to return to PLOF. PT will continue to follow acutely.      Follow Up Recommendations SNF    Equipment Recommendations  None recommended by PT       Precautions / Restrictions Precautions Precautions: Fall Restrictions Weight Bearing Restrictions: No      Mobility  Bed Mobility Overal bed mobility: Needs Assistance Bed Mobility: Supine to Sit     Supine to sit: Max assist;+2 for physical assistance     General bed mobility comments: maxAx2 and maximal vc for reaching for bedrail and scooting hips to EoB, assist needed for LE management and trunk to upright  Transfers Overall transfer level: Needs assistance Equipment used: 2 person hand held assist Transfers: Stand Pivot Transfers;Sit to/from Stand Sit to Stand: Max assist Stand pivot transfers: Total assist       General transfer comment: maxA for bringing hips off of chair for placement of pillow         Balance Overall balance assessment: Needs assistance Sitting-balance support: Bilateral upper extremity supported Sitting balance-Leahy Scale: Poor Sitting balance - Comments:  able to statically hold herself up       Standing balance comment: unable to attain                             Pertinent Vitals/Pain Pain Assessment: Faces Faces Pain Scale: Hurts little more Pain Location: generalized LE>UE with movement Pain Descriptors / Indicators: Grimacing;Guarding;Moaning Pain Intervention(s): Limited activity within patient's tolerance;Monitored during session;Repositioned    Home Living Family/patient expects to be discharged to:: Skilled nursing facility                      Prior Function Level of Independence: Needs assistance   Gait / Transfers Assistance Needed: per prior note, staff transfers pt to w/c and she is able to independently move herself around to the facility  ADL's / Homemaking Assistance Needed: assist for ADLs           Extremity/Trunk Assessment   Upper Extremity Assessment Upper Extremity Assessment: Generalized weakness;Difficult to assess due to impaired cognition    Lower Extremity Assessment Lower Extremity Assessment: RLE deficits/detail;LLE deficits/detail;Difficult to assess due to impaired cognition RLE Deficits / Details: lacks dorsiflexion, has not been standing for awhile LLE Deficits / Details: lacks dorsiflexion, has not been standing for awhile    Cervical / Trunk Assessment Cervical / Trunk Assessment: Kyphotic  Communication   Communication: HOH  Cognition Arousal/Alertness: Awake/alert Behavior During Therapy: Agitated Overall Cognitive Status: History of cognitive impairments - at baseline  General Comments General comments (skin integrity, edema, etc.): Pt on 2L O2 throughout session, VSS        Assessment/Plan    PT Assessment Patient needs continued PT services  PT Problem List Decreased strength;Decreased range of motion;Decreased activity tolerance;Decreased balance;Decreased mobility;Decreased  coordination;Decreased cognition;Decreased safety awareness;Pain       PT Treatment Interventions DME instruction;Functional mobility training;Therapeutic activities;Therapeutic exercise;Balance training;Cognitive remediation;Wheelchair mobility training;Patient/family education    PT Goals (Current goals can be found in the Care Plan section)  Acute Rehab PT Goals Patient Stated Goal: none stated PT Goal Formulation: Patient unable to participate in goal setting Time For Goal Achievement: 08/15/18 Potential to Achieve Goals: Fair    Frequency Min 2X/week    AM-PAC PT "6 Clicks" Mobility  Outcome Measure Help needed turning from your back to your side while in a flat bed without using bedrails?: A Lot Help needed moving from lying on your back to sitting on the side of a flat bed without using bedrails?: Total Help needed moving to and from a bed to a chair (including a wheelchair)?: Total Help needed standing up from a chair using your arms (e.g., wheelchair or bedside chair)?: Total Help needed to walk in hospital room?: Total Help needed climbing 3-5 steps with a railing? : Total 6 Click Score: 7    End of Session   Activity Tolerance: Patient tolerated treatment well Patient left: in chair;with call bell/phone within reach;with chair alarm set Nurse Communication: Mobility status PT Visit Diagnosis: Unsteadiness on feet (R26.81);Other abnormalities of gait and mobility (R26.89);Muscle weakness (generalized) (M62.81);Difficulty in walking, not elsewhere classified (R26.2);Pain Pain - part of body: (generalized)    Time: 7544-9201 PT Time Calculation (min) (ACUTE ONLY): 26 min   Charges:   PT Evaluation $PT Eval Moderate Complexity: 1 Mod PT Treatments $Therapeutic Activity: 8-22 mins        Shirlena Brinegar B. Migdalia Dk PT, DPT Acute Rehabilitation Services Pager [phone] Office [phone]   [place] 08/01/2018, 9:58 AM

## 2018-08-02 ENCOUNTER — Inpatient Hospital Stay (HOSPITAL_COMMUNITY): Payer: Medicare (Managed Care)

## 2018-08-02 DIAGNOSIS — L89302 Pressure ulcer of unspecified buttock, stage 2: Secondary | ICD-10-CM

## 2018-08-02 LAB — BASIC METABOLIC PANEL
Anion gap: 7 (ref 5–15)
BUN: 11 mg/dL (ref 8–23)
CO2: 29 mmol/L (ref 22–32)
Calcium: 8.3 mg/dL — ABNORMAL LOW (ref 8.9–10.3)
Chloride: 105 mmol/L (ref 98–111)
Creatinine, Ser: 0.74 mg/dL (ref 0.44–1.00)
GFR calc Af Amer: >60 mL/min (ref >60)
GFR calc non Af Amer: >60 mL/min (ref >60)
Glucose, Bld: 85 mg/dL (ref 70–99)
POTASSIUM: 3.2 mmol/L — AB (ref 3.5–5.1)
SODIUM: 141 mmol/L (ref 135–145)

## 2018-08-02 LAB — GLUCOSE, CAPILLARY
Glucose-Capillary: 116 mg/dL — ABNORMAL HIGH (ref 70–99)
Glucose-Capillary: 80 mg/dL (ref 70–99)
Glucose-Capillary: 82 mg/dL (ref 70–99)
Glucose-Capillary: 111 mg/dL — ABNORMAL HIGH (ref 70–99)

## 2018-08-02 LAB — CULTURE, BLOOD (ROUTINE X 2)
CULTURE: NO GROWTH
Culture: NO GROWTH
Special Requests: ADEQUATE

## 2018-08-02 MED ORDER — POTASSIUM CHLORIDE CRYS ER 20 MEQ PO TBCR
40.0000 meq | EXTENDED_RELEASE_TABLET | ORAL | Status: DC
  Administered 2018-08-02: 40 meq via ORAL
  Filled 2018-08-02: qty 2

## 2018-08-02 MED ORDER — FUROSEMIDE 10 MG/ML IJ SOLN
40.0000 mg | Freq: Once | INTRAMUSCULAR | Status: AC
  Administered 2018-08-02: 40 mg via INTRAVENOUS
  Filled 2018-08-02: qty 4

## 2018-08-02 NOTE — Discharge Summary (Addendum)
Physician Discharge Summary  PHILA SHOAF NAT:[id number] DOB: Jan 25, 1922 DOA: 07/28/2018  PCP: Janifer Adie, MD  Admit date: 07/28/2018 Discharge date: 08/02/2018  Admitted From: SNF  Disposition:  SNf   Recommendations for Outpatient Follow-up and new medication changes:  1. Follow up with Dr. Bradd Burner in 7 days.  2. Patient placed on aspiration precautions.   Home Health: no   Equipment/Devices: no    Discharge Condition: stable  CODE STATUS: DNR   Diet recommendation: Regular  Diet recommendations: Nectar-thick liquid;Dysphagia 1 (puree) Liquids provided via: Straw Medication Administration: Crushed with puree Supervision: Full supervision/cueing for compensatory strategies;Staff to assist with self feeding Compensations: Small sips/bites;Follow solids with liquid;Hard cough after swallow Postural Changes and/or Swallow Maneuvers: Seated upright 90 degrees  "Ms. Lurie continues to demonstrate intermittent signs of dysphagia and aspiration with most textures (nectar/thin and puree) though nectar thick liquids and puree for now seem to lead to decreased coughing and expectoration. Risk is present with all textures. While pt is recovering from acute illness, puree and thickened liquids (with ice added for pt pleasure) are advised. As pt recovers over the next 5-7 days would advise upgraded very soft textures for pts pleasure with full supervision for careful hand feeding, as well as thin liquids if possible to encourage hydration and pt comfort. Prolonged use of thickened liquids would not be expected to decrease aspiration risk over the long term. Discussed at length with pts daughter whose main concern is advocating for her mothers comfort while also avoiding risk of immediate asphyxiation with solids. I  advised her to follow the recommendations of the [facility] S[facility] and seek her assistance with diet modifications and meals as needed. MD also aware of recommendation".   Herbie [place], MA CCC-SLP  Acute Rehabilitation Services   Brief/Interim Summary: 85 year old female who presented with choking. She does have significant past medical history for dysphagia. Apparently she was found choking after eating a cheese sandwich. She was intubated on the field, and by the time she arrived to the emergency department she was hypoxemic and hypotensive. Patient underwent bronchoscopy in the emergency department for foreign object extraction. Her blood pressure was100/67, heart rate 91, respirate 16, oxygen saturation 100% on PRVC mechanical ventilation.Lungs have diffuse rhonchi, heart S1-S2 present regular, abdomen soft, no lower extremity edema. Sodium 137, potassium 4.7, chloride 100, bicarb 26, glucose 309, BUN 20, creatinine 1.05, white count 20.0, hemoglobin 12.2, crit 38.8, platelets 144.  Her chest film had a right upper lobe opacity.  Endotracheal tube in place.  Patient was admitted to the hospital with the working diagnosis of acute hypoxic respiratory failure due toforeginobject aspiration in the right upper lobe.  1.  Acute hypoxic respiratory failure due to right upper lobe aspiration/complicated with aspiration pneumonitis (present on admission).  Patient underwent bronchoscopy, found large and small pieces of debris that were visualized in the right middle lobe, right lower lobe and left lower lobe, which were suctioned, and the airways were cleared.  No visible foreign objects were seen prior to removal of the scope.  Patient received IV antibiotic therapy, and was successfully liberated from mechanical ventilation.  Follow-up chest radiograph showed resolution of infiltrates, leukocytosis improved, no fevers, cultures remain no growth, then antibiotics were discontinued (pneumonia ruled out).  2.  Septic shock due to aspiration pneumonitis, right upper lobe, present on admission/ complicated by volume overload acute pulmonary edema with bilateral pleural  effusions. Patient received isotonic saline for volume resuscitation, she had refractive hypotension and required vasopressors.  She rapidly regained hemodynamic stability, sedation was discontinued in order to wean her off the mechanical ventilator, and vasopressors were discontinued.  She developed bilateral effusions consistent with volume overload related to volume resuscitation, received furosemide with improvement of effusions and oxygenation.  Her discharge oximetry is 92% on room air.  3.  Hypertension.  Continue metoprolol for blood pressure control.  4.  Moderate calorie protein malnutrition.  She was placed nutritional supplements.  5.  Dementia with swallow dysfunction.  Patient remained confused and disoriented, no agitation, continue Depakote, lorazepam, risperidone and sertraline.  Patient was evaluated by speech therapy, she was allowed to have dysphagia 1 diet with aspiration precautions.  6.  Reactive hyperglycemia.  Received insulin sliding scale for glucose coverage and monitoring.  7.  Pressure ulcer stage II, present on admission, at the right coccyx, local wound care, continue outpatient follow-up.   Discharge Diagnoses:  Active Problems:   Dementia without behavioral disturbance (HCC)   Aspiration of foreign body in respiratory tract   Pressure injury of skin   Palliative care by specialist   Hypoxemia   Pneumonia due to infectious organism   Malnutrition of moderate degree   Goals of care, counseling/discussion   Dysphagia   DNR (do not resuscitate)    Discharge Instructions  Discharge Instructions    Diet - low sodium heart healthy   Complete by:  As directed    Discharge instructions   Complete by:  As directed    Please follow with primary care in 7 days.   Increase activity slowly   Complete by:  As directed      Allergies as of 08/02/2018      Reactions   Fluticasone-salmeterol Other (See Comments)   Per MAR   Statins Other (See Comments)    Per MAR   Sulfa Antibiotics Other (See Comments)   Per MAR      Medication List    STOP taking these medications   metoprolol succinate 25 MG 24 hr tablet Commonly known as:  TOPROL XL     TAKE these medications   acetaminophen 325 MG tablet Commonly known as:  TYLENOL Take 650 mg by mouth daily.   aspirin 81 MG EC tablet Take 1 tablet (81 mg total) by mouth daily.   bisacodyl 10 MG suppository Commonly known as:  DULCOLAX Place 10 mg rectally daily as needed for moderate constipation.   divalproex 125 MG capsule Commonly known as:  DEPAKOTE SPRINKLE Take 125 mg by mouth daily at 12 noon.   feeding supplement (ENSURE ENLIVE) Liqd Take 237 mLs by mouth 3 (three) times daily between meals for 30 days.   glipiZIDE 2.5 MG 24 hr tablet Commonly known as:  GLUCOTROL XL Take 2.5 mg by mouth daily with breakfast.   loperamide 2 MG tablet Commonly known as:  IMODIUM A-D Take 2 mg by mouth as needed for diarrhea or loose stools.   LORazepam 0.5 MG tablet Commonly known as:  ATIVAN Take 0.5 tablets (0.25 mg total) by mouth every 6 (six) hours as needed for anxiety. What changed:  Another medication with the same name was removed. Continue taking this medication, and follow the directions you see here.   magnesium hydroxide 400 MG/5ML suspension Commonly known as:  MILK OF MAGNESIA Take 30 mLs by mouth daily as needed for mild constipation.   metoprolol tartrate 25 MG tablet Commonly known as:  LOPRESSOR Take 0.5 tablets (12.5 mg total) by mouth 2 (two) times daily for 30 days.  multivitamin with minerals Tabs tablet Take 1 tablet by mouth daily for 30 days.   nitroGLYCERIN 0.4 MG SL tablet Commonly known as:  NITROSTAT Place 1 tablet (0.4 mg total) under the tongue every 5 (five) minutes as needed for chest pain (up to 3 doses).   ondansetron 4 MG disintegrating tablet Commonly known as:  ZOFRAN-ODT Take 4 mg by mouth every 6 (six) hours as needed for nausea or  vomiting.   RA SALINE ENEMA 19-7 GM/118ML Enem Place 118 mLs rectally daily as needed (constipation).   ranitidine 150 MG tablet Commonly known as:  ZANTAC Take 150 mg by mouth 2 (two) times daily as needed for heartburn.   risperiDONE microspheres 25 MG injection Commonly known as:  RISPERDAL CONSTA Inject 25 mg into the muscle every 14 (fourteen) days.   SENNA S 8.6-50 MG tablet Generic drug:  senna-docusate Take 1-2 tablets by mouth 2 (two) times daily. Take 2 tablets every morning then take 1 tablet every evening   sertraline 100 MG tablet Commonly known as:  ZOLOFT Take 100 mg by mouth daily.   Vitamin D (Ergocalciferol) 1.25 MG (50000 UT) Caps capsule Commonly known as:  DRISDOL Take 50,000 Units by mouth every 30 (thirty) days.       Contact information for follow-up providers    Janifer Adie, MD Follow up.   Specialty:  Family Medicine Contact information: [place] [address] [phone]            Contact information for after-discharge care    Destination    [facility] .   Service:  Skilled Nursing Contact information: [address] [address] [phone]                 Allergies  Allergen Reactions  . Fluticasone-Salmeterol Other (See Comments)    Per MAR  . Statins Other (See Comments)    Per MAR  . Sulfa Antibiotics Other (See Comments)    Per MAR    Consultations:     Procedures/Studies: Dg Chest 1 View  Result Date: 08/02/2018 CLINICAL DATA:  Follow-up BILATERAL pleural effusions and bibasilar atelectasis. EXAM: Portable CHEST 1 VIEW COMPARISON:  08/01/2018 and earlier. FINDINGS: Improved BILATERAL pleural effusions since yesterday, with small BILATERAL effusions persisting. Improved aeration in the lower lobes, with moderate LEFT LOWER LOBE consolidation and minimal RIGHT LOWER LOBE consolidation persisting. No new pulmonary parenchymal  abnormalities. Cardiac silhouette upper normal in size for AP portable technique. Mild pulmonary venous hypertension without overt edema. IMPRESSION: 1. Improved BILATERAL pleural effusions with small effusions persisting. 2. Improved aeration in the BILATERAL lower lobes, with moderate LEFT LOWER LOBE atelectasis and/or pneumonia and minimal RIGHT LOWER LOBE atelectasis persisting. 3. No new abnormalities. Electronically Signed   By: Evangeline Dakin M.D.   On: 08/02/2018 08:36   Dg Chest 2 View  Result Date: 08/01/2018 CLINICAL DATA:  Followup aspiration pneumonitis. EXAM: CHEST - 2 VIEW COMPARISON:  07/30/2018 FINDINGS: Mild cardiomegaly. Chronic aortic atherosclerosis. Bilateral effusions layering dependently, right larger than left, with atelectasis and or infiltrate in both lower lungs. There is some improvement in aeration of the left lower lobe. Upper lungs remain clear. IMPRESSION: Bilateral effusions with dependent atelectasis. Some improved aeration in the left lower lobe compared to the study of 2 days ago. Electronically Signed   By: Nelson Chimes M.D.   On: 08/01/2018 10:37   Dg Chest Port 1 View  Result Date: 07/30/2018 CLINICAL  DATA:  Pneumonia. EXAM: PORTABLE CHEST 1 VIEW COMPARISON:  07/29/2018 FINDINGS: The patient is rotated to the left which limits assessment of the mediastinal structures. The endotracheal and enteric tubes have been removed. There is new patchy opacity in the right mid and lower lung greatest in the infrahilar region, and there is also new dense opacity in the left lung base. There may be small bilateral pleural effusions. No pneumothorax is identified. IMPRESSION: Rotated examination with new left greater than right lower lung airspace consolidation and possible small bilateral pleural effusions following extubation. Electronically Signed   By: Logan Bores M.D.   On: 07/30/2018 08:14   Dg Chest Port 1 View  Result Date: 07/29/2018 CLINICAL DATA:  Endotracheal tube  placement. EXAM: PORTABLE CHEST 1 VIEW COMPARISON:  Radiograph same day. FINDINGS: Stable cardiomediastinal silhouette. Atherosclerosis of thoracic aorta is noted. Endotracheal and nasogastric tubes are unchanged in position. No pneumothorax is noted. Right lung is clear. Stable left lingular subsegmental atelectasis. No significant pleural effusion is noted. Bony thorax is unremarkable. IMPRESSION: Endotracheal and nasogastric tubes are in grossly good position. Stable left lingular subsegmental atelectasis. Aortic Atherosclerosis (ICD10-I70.0). Electronically Signed   By: Marijo Conception, M.D.   On: 07/29/2018 07:47   Dg Chest Port 1 View  Result Date: 07/29/2018 CLINICAL DATA:  Hypoxemia. EXAM: PORTABLE CHEST 1 VIEW COMPARISON:  Radiograph of July 28, 2018. FINDINGS: Stable cardiomediastinal silhouette. Atherosclerosis of thoracic aorta is noted. Endotracheal and nasogastric tubes are in grossly good position. No pneumothorax is noted. No significant pleural effusion is noted. Stable left midlung subsegmental atelectasis is noted. Right lung is clear. Right upper lobe airspace opacity noted on prior exam has resolved. Bony thorax is unremarkable. IMPRESSION: Endotracheal and nasogastric tubes in grossly good position. Right upper lobe opacity noted on prior exam has resolved. Stable left lingular subsegmental atelectasis. Aortic Atherosclerosis (ICD10-I70.0). Electronically Signed   By: Marijo Conception, M.D.   On: 07/29/2018 07:19   Dg Chest Port 1 View  Result Date: 07/28/2018 CLINICAL DATA:  Post bronchoscopy EXAM: PORTABLE CHEST 1 VIEW COMPARISON:  07/28/2018 FINDINGS: Endotracheal tube tip is about 1.4 cm superior to the carina. Volume loss right hemithorax with persistent right upper lobe atelectasis. Left lung is clear. Heart size is normal. Aortic atherosclerosis. IMPRESSION: 1. Endotracheal tube tip about 1.4 cm superior to carina 2. Persistent opacity in the right apical lung with volume loss  suggesting right upper lobe collapse/atelectasis. Electronically Signed   By: Donavan Foil M.D.   On: 07/28/2018 18:53   Dg Chest Portable 1 View  Result Date: 07/28/2018 CLINICAL DATA:  Foreign body aspiration EXAM: PORTABLE CHEST 1 VIEW COMPARISON:  07/28/2018 FINDINGS: Endotracheal tube tip about a cm superior to the carina. Similar appearance of right upper lobe atelectasis and volume loss in the right thorax. Platelike atelectasis in the left lower lung. Normal heart size. No pneumothorax. Aortic atherosclerosis IMPRESSION: 1. Endotracheal tube tip about a cm superior to the carina 2. Persistent volume loss in the right hemithorax with right upper lobe collapse/atelectasis 3. New subsegmental atelectasis in the left lower lung Electronically Signed   By: Donavan Foil M.D.   On: 07/28/2018 18:52   Dg Chest Portable 1 View  Result Date: 07/28/2018 CLINICAL DATA:  Intubated patient EXAM: PORTABLE CHEST 1 VIEW COMPARISON:  06/01/2018, 05/27/2018 FINDINGS: Endotracheal tube tip just above the carina. Volume loss on the right with right upper lobe atelectasis. Left lung is clear. Heart size is normal IMPRESSION: 1. Endotracheal tube  tip just above the carina 2. Volume loss in the right hemithorax with right upper lobe collapse/atelectasis. Electronically Signed   By: Donavan Foil M.D.   On: 07/28/2018 18:49       Subjective: Patient is feeling well, limited history due to dementia, not in apparent dyspnea or pain, now on room air.   Discharge Exam: Vitals:   08/01/18 2022 08/02/18 0516  BP: 137/64 125/71  Pulse: (!) 104 95  Resp: 16 16  Temp: 98 F (36.7 C) (!) 97.5 F (36.4 C)  SpO2: 94% 92%   Vitals:   08/01/18 0801 08/01/18 1406 08/01/18 2022 08/02/18 0516  BP: 121/61 121/69 137/64 125/71  Pulse: 99 91 (!) 104 95  Resp: (!) 22 16 16 16   Temp: (!) 97.5 F (36.4 C) 98 F (36.7 C) 98 F (36.7 C) (!) 97.5 F (36.4 C)  TempSrc: Oral Oral Oral   SpO2: 100% 100% 94% 92%  Weight:       Height:        General: Not in pain or dyspnea  Neurology: This am is somnolent on my examination, non focal  E ENT: mild pallor, no icterus, oral mucosa moist Cardiovascular: No JVD. S1-S2 present, rhythmic, no gallops, rubs, or murmurs. No lower extremity edema. Pulmonary: vesicular breath sounds bilaterally, adequate air movement, no wheezing, rhonchi or rales. Anterior auscultation.  Gastrointestinal. Abdomen with  no organomegaly, non tender, no rebound or guarding Skin. No rashes Musculoskeletal: no joint deformities   The results of significant diagnostics from this hospitalization (including imaging, microbiology, ancillary and laboratory) are listed below for reference.     Microbiology: Recent Results (from the past 240 hour(s))  MRSA PCR Screening     Status: None   Collection Time: 07/28/18  8:45 PM  Result Value Ref Range Status   MRSA by PCR NEGATIVE NEGATIVE Final    Comment:        The GeneXpert MRSA Assay (FDA approved for NASAL specimens only), is one component of a comprehensive MRSA colonization surveillance program. It is not intended to diagnose MRSA infection nor to guide or monitor treatment for MRSA infections. Performed at [facility], [place] [address], [place], [place] [address]   Culture, respiratory     Status: None   Collection Time: 07/28/18  8:54 PM  Result Value Ref Range Status   Specimen Description TRACHEAL ASPIRATE  Final   Special Requests NONE  Final   Gram Stain   Final    MODERATE WBC PRESENT, PREDOMINANTLY PMN FEW GRAM POSITIVE COCCI FEW GRAM NEGATIVE RODS FEW GRAM POSITIVE RODS FEW YEAST    Culture   Final    FEW Consistent with normal respiratory flora. Performed at [facility], [place] [address] [place], [place] [address]    Report Status 07/31/2018 FINAL  Final  Culture, blood (routine x 2) Call MD if unable to obtain prior to antibiotics being given     Status: None   Collection Time: 07/28/18  9:57 PM   Result Value Ref Range Status   Specimen Description BLOOD RIGHT ANTECUBITAL  Final   Special Requests   Final    BOTTLES DRAWN AEROBIC ONLY Blood Culture adequate volume   Culture   Final    NO GROWTH 5 DAYS Performed at [facility], [place] [address] [place], [place] [address]    Report Status 08/02/2018 FINAL  Final  Culture, blood (routine x 2) Call MD if unable to obtain prior to  antibiotics being given     Status: None   Collection Time: 07/28/18  9:57 PM  Result Value Ref Range Status   Specimen Description BLOOD LEFT HAND  Final   Special Requests   Final    BOTTLES DRAWN AEROBIC ONLY Blood Culture results may not be optimal due to an inadequate volume of blood received in culture bottles   Culture   Final    NO GROWTH 5 DAYS Performed at [facility], [place] [address], [place], [place] [address]    Report Status 08/02/2018 FINAL  Final     Labs: BNP (last 3 results) No results for input(s): BNP in the last 8760 hours. Basic Metabolic Panel: Recent Labs  Lab 07/29/18 0417 07/30/18 0655 07/31/18 0225 08/01/18 0153 08/02/18 0251  NA 141 141 141 142 141  K 4.6 3.8 4.6 3.6 3.2*  CL 108 113* 114* 116* 105  CO2 24 24 21* 21* 29  GLUCOSE 244* 108* 159* 84 85  BUN 19 12 10 8 11   CREATININE 1.08* 0.89 0.78 0.67 0.74  CALCIUM 8.2* 7.6* 7.9* 8.2* 8.3*  MG 1.3* 2.2 2.0  --   --   PHOS 3.0 2.6 2.7  --   --    Liver Function Tests: Recent Labs  Lab 07/28/18 1828  AST 23  ALT 12  ALKPHOS 68  BILITOT 0.4  PROT 6.5  ALBUMIN 2.9*   No results for input(s): LIPASE, AMYLASE in the last 168 hours. No results for input(s): AMMONIA in the last 168 hours. CBC: Recent Labs  Lab 07/28/18 1810 07/28/18 2112 07/29/18 0417 07/30/18 0655 07/31/18 0225  WBC 7.8  --  20.0* 10.4 7.5  HGB 13.4 13.3 12.2 10.6* 11.1*  HCT 42.7 39.0 38.8 35.2* 36.7  MCV 101.4*  --  100.3* 102.3* 101.7*  PLT 162  --  144* PLATELET CLUMPS NOTED ON SMEAR, COUNT APPEARS DECREASED 102*    Cardiac Enzymes: No results for input(s): CKTOTAL, CKMB, CKMBINDEX, TROPONINI in the last 168 hours. BNP: Invalid input(s): POCBNP CBG: Recent Labs  Lab 08/01/18 1655 08/01/18 2004 08/02/18 0109 08/02/18 0510 08/02/18 0754  GLUCAP 117* 152* 116* 80 82   D-Dimer No results for input(s): DDIMER in the last 72 hours. Hgb A1c No results for input(s): HGBA1C in the last 72 hours. Lipid Profile No results for input(s): CHOL, HDL, LDLCALC, TRIG, CHOLHDL, LDLDIRECT in the last 72 hours. Thyroid function studies No results for input(s): TSH, T4TOTAL, T3FREE, THYROIDAB in the last 72 hours.  Invalid input(s): FREET3 Anemia work up No results for input(s): VITAMINB12, FOLATE, FERRITIN, TIBC, IRON, RETICCTPCT in the last 72 hours. Urinalysis    Component Value Date/Time   COLORURINE AMBER (A) 06/01/2018 1300   APPEARANCEUR TURBID (A) 06/01/2018 1300   LABSPEC 1.020 06/01/2018 1300   PHURINE 5.0 06/01/2018 1300   GLUCOSEU 50 (A) 06/01/2018 1300   HGBUR MODERATE (A) 06/01/2018 1300   HGBUR negative 03/13/2007 1052   BILIRUBINUR NEGATIVE 06/01/2018 1300   KETONESUR NEGATIVE 06/01/2018 1300   PROTEINUR NEGATIVE 06/01/2018 1300   UROBILINOGEN 1.0 02/09/2013 1050   NITRITE NEGATIVE 06/01/2018 1300   LEUKOCYTESUR LARGE (A) 06/01/2018 1300   Sepsis Labs Invalid input(s): PROCALCITONIN,  WBC,  LACTICIDVEN Microbiology Recent Results (from the past 240 hour(s))  MRSA PCR Screening     Status: None   Collection Time: 07/28/18  8:45 PM  Result Value Ref Range Status   MRSA by PCR NEGATIVE NEGATIVE Final    Comment:  The GeneXpert MRSA Assay (FDA approved for NASAL specimens only), is one component of a comprehensive MRSA colonization surveillance program. It is not intended to diagnose MRSA infection nor to guide or monitor treatment for MRSA infections. Performed at [facility], [place] [address] [place], [place] [address]   Culture, respiratory     Status: None    Collection Time: 07/28/18  8:54 PM  Result Value Ref Range Status   Specimen Description TRACHEAL ASPIRATE  Final   Special Requests NONE  Final   Gram Stain   Final    MODERATE WBC PRESENT, PREDOMINANTLY PMN FEW GRAM POSITIVE COCCI FEW GRAM NEGATIVE RODS FEW GRAM POSITIVE RODS FEW YEAST    Culture   Final    FEW Consistent with normal respiratory flora. Performed at [facility], [place] [address], [place], [place] [address]    Report Status 07/31/2018 FINAL  Final  Culture, blood (routine x 2) Call MD if unable to obtain prior to antibiotics being given     Status: None   Collection Time: 07/28/18  9:57 PM  Result Value Ref Range Status   Specimen Description BLOOD RIGHT ANTECUBITAL  Final   Special Requests   Final    BOTTLES DRAWN AEROBIC ONLY Blood Culture adequate volume   Culture   Final    NO GROWTH 5 DAYS Performed at [facility], [place] [address], [place], [place] [address]    Report Status 08/02/2018 FINAL  Final  Culture, blood (routine x 2) Call MD if unable to obtain prior to antibiotics being given     Status: None   Collection Time: 07/28/18  9:57 PM  Result Value Ref Range Status   Specimen Description BLOOD LEFT HAND  Final   Special Requests   Final    BOTTLES DRAWN AEROBIC ONLY Blood Culture results may not be optimal due to an inadequate volume of blood received in culture bottles   Culture   Final    NO GROWTH 5 DAYS Performed at [facility], [place] [address], [place], [place] [address]    Report Status 08/02/2018 FINAL  Final     Time coordinating discharge: 45 minutes  SIGNED:   Tawni Millers, MD  [facility] 08/02/2018, 9:10 AM

## 2018-08-02 NOTE — Progress Notes (Signed)
Patient transported to [facility] via [facility].

## 2018-08-02 NOTE — Plan of Care (Signed)
  Problem: Clinical Measurements: Goal: Respiratory complications will improve Outcome: Progressing   Problem: Clinical Measurements: Goal: Will remain free from infection Outcome: Progressing   Problem: Elimination: Goal: Will not experience complications related to bowel motility Outcome: Progressing   Problem: Elimination: Goal: Will not experience complications related to urinary retention Outcome: Progressing

## 2018-08-02 NOTE — Patient Care Conference (Signed)
Report given to [place], Therapist, sports at [facility].

## 2018-08-02 NOTE — Progress Notes (Signed)
Patient will Discharge To: [facility] Anticipated DC Date:08/02/2018 Family Notified: yes, daughter Roderic Scarce [phone] Transport By: Caryl Pina not able to transport pt is bedridden per RN at discharge).   Per MD patient ready for DC to [facility] . RN, patient, patient's family, and facility notified of DC. Assessment, Fl2/Pasrr, and Discharge Summary sent to facility. RN given number for report [phone], Room # 313). DC packet on chart. Ambulance transport requested for patient.   CSW signing off.  Reed Breech LCSWA [phone]

## 2018-09-17 DEATH — deceased

## 2019-08-01 IMAGING — DX DG CHEST 1V
1 series · 1 of 1 positions shown · non-contrast
Comparison: 02/15/2017.  10/25/2015.

CLINICAL DATA: Prolonged cough and congestion.

EXAM:
CHEST 1 VIEW

[view not recorded]
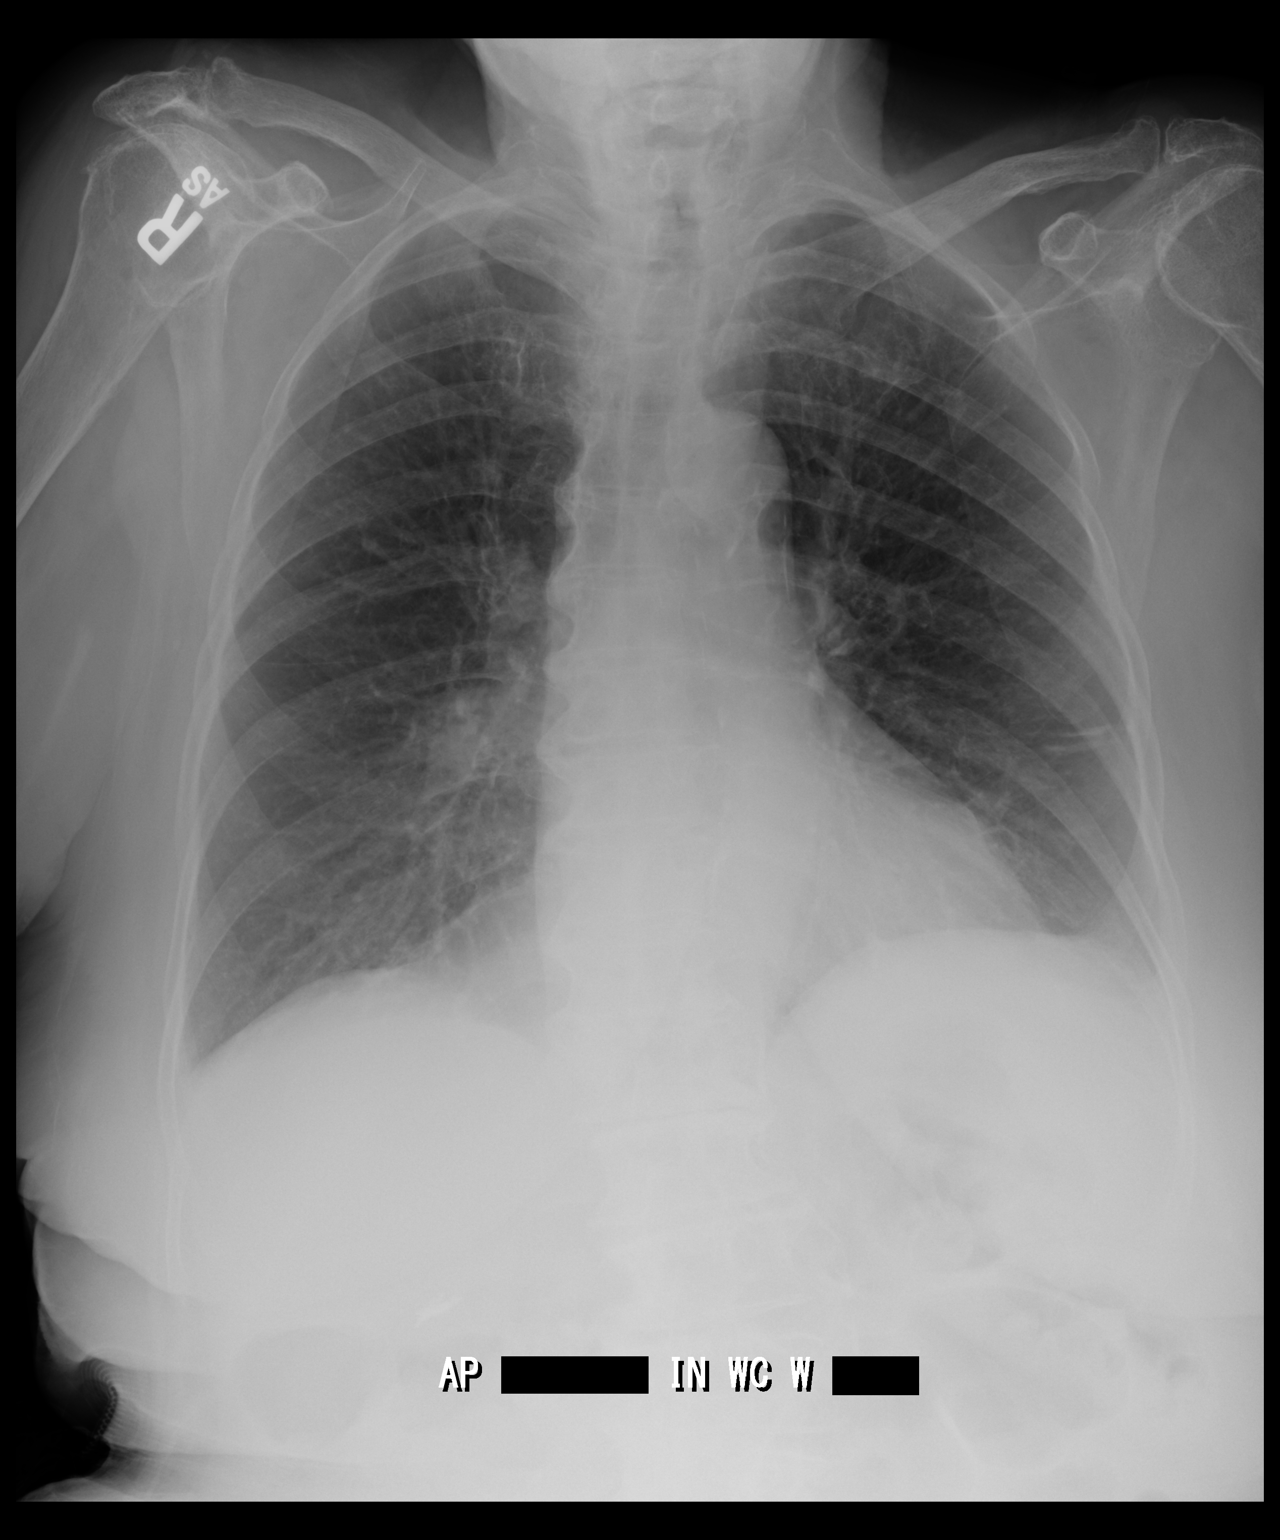

[1 of 1 positions shown; findings below may reference images not displayed]

FINDINGS: Heart size is normal. There is aortic atherosclerosis. Chronic
scarring in the left lower lobe lateral. Chronic pleural blunting on
the left. Prominence of density in the right inferior hilum and
right lower lobe. Whereas this may represent pneumonia, chest CT
suggested to exclude hilar and lower lobe mass. No significant bone
finding.
IMPRESSION: Inferior right hilar prominence in right lower lobe density. This
may be inflammatory, but chest CT is suggested to exclude mass
lesion.

## 2020-11-03 IMAGING — CT CT HEAD W/O CM
3 of 7 series · 13 of 47 positions shown, 15 images · non-contrast
Comparison: Multiple priors, most recent 05/22/2018.

CLINICAL DATA: Altered mental status. Head trauma.

EXAM:
CT HEAD WITHOUT CONTRAST
TECHNIQUE: Contiguous axial images were obtained from the base of the skull
through the vertex without intravenous contrast.

[Series 10: coronal st · coronal · 0.39mm/px · 3 of 112 slices shown]
[im 23/112  brain]
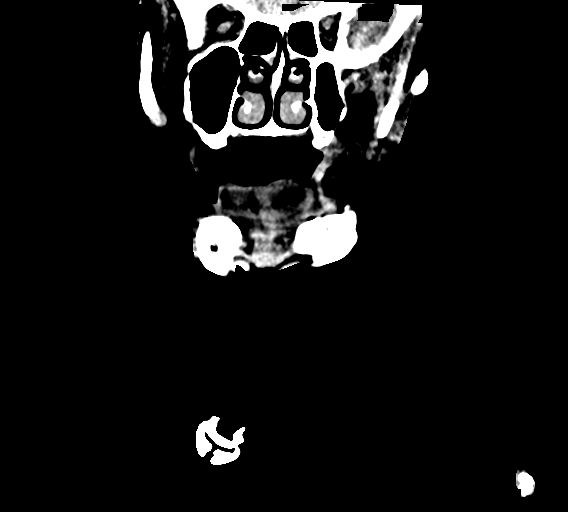
[im 45/112  brain]
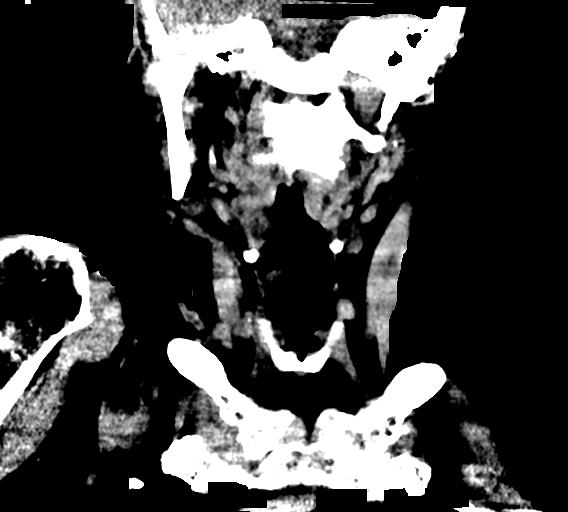
[im 67/112  brain]
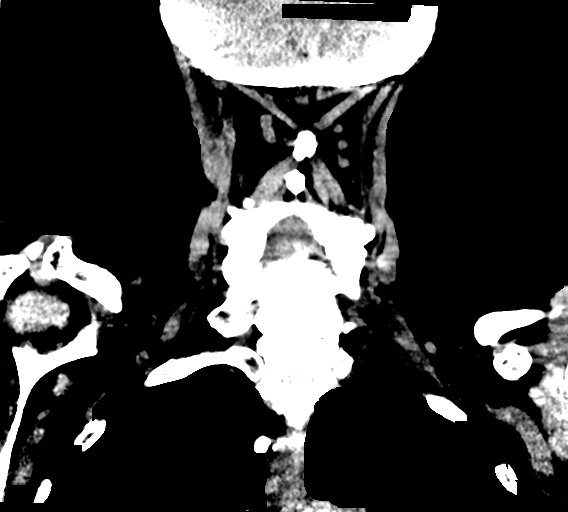

[Series 11: sagittal st · sagittal · 0.43mm/px · 2 of 101 slices shown]
[im 34/101  brain]
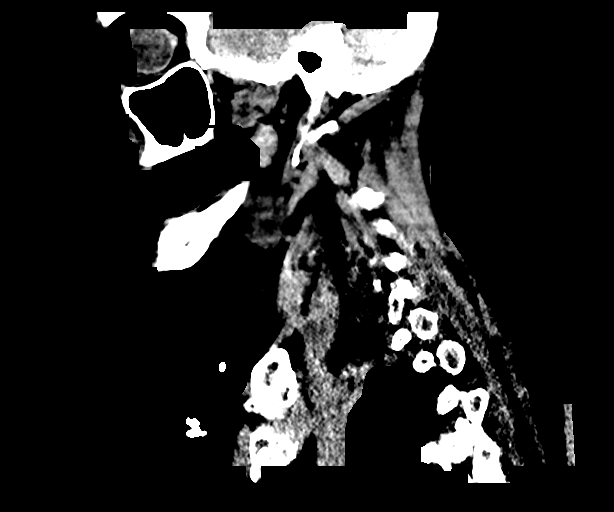
[im 67/101  brain]
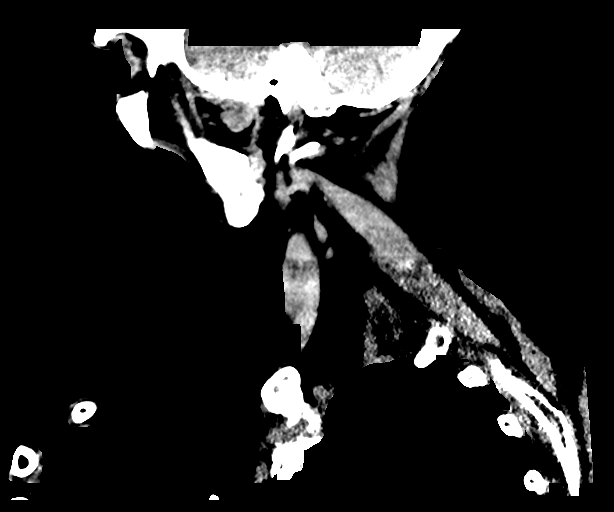

[Series 12: orthogonal st · axial · 0.39mm/px · z∈[-242,-67]mm · 8 of 106 slices shown, 10 images]
[im 9/106  brain]
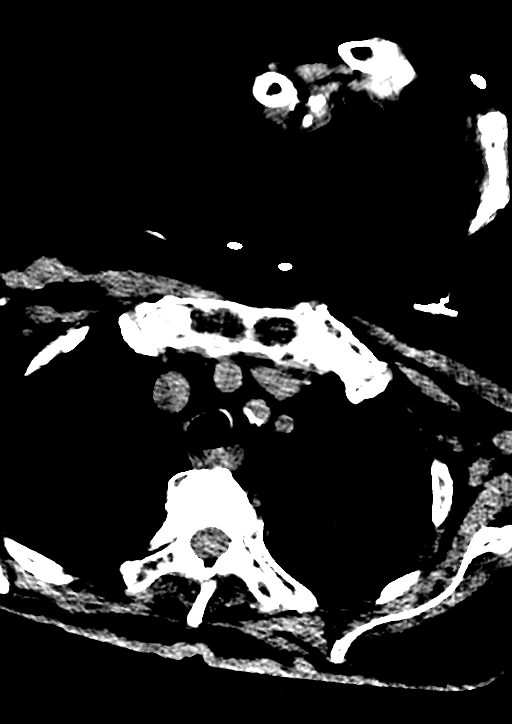
[im 9/106  bone]
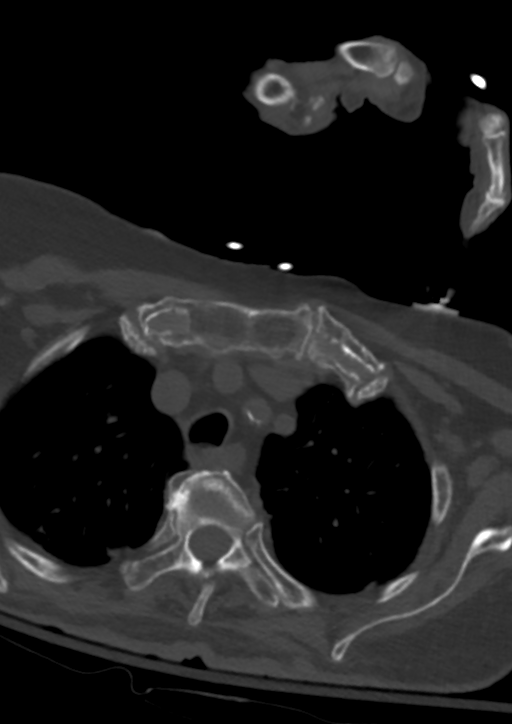
[im 27/106  brain]
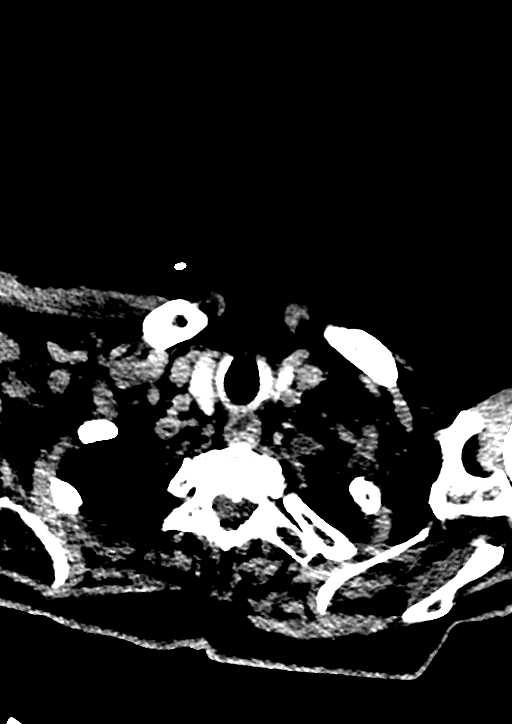
[im 36/106  brain]
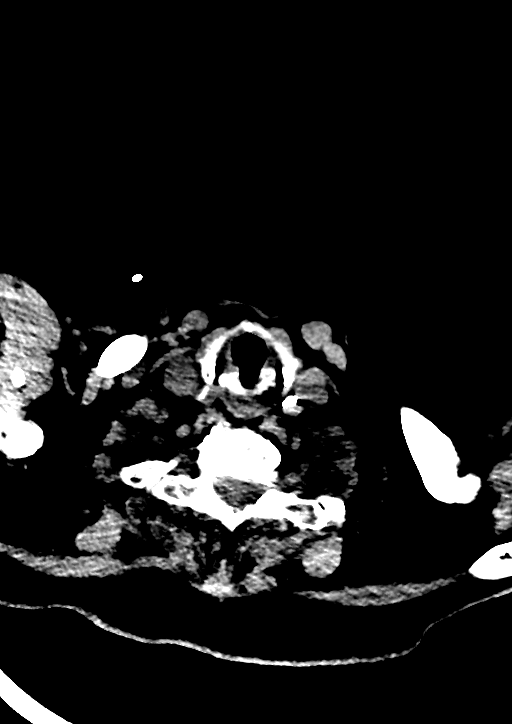
[im 44/106  brain]
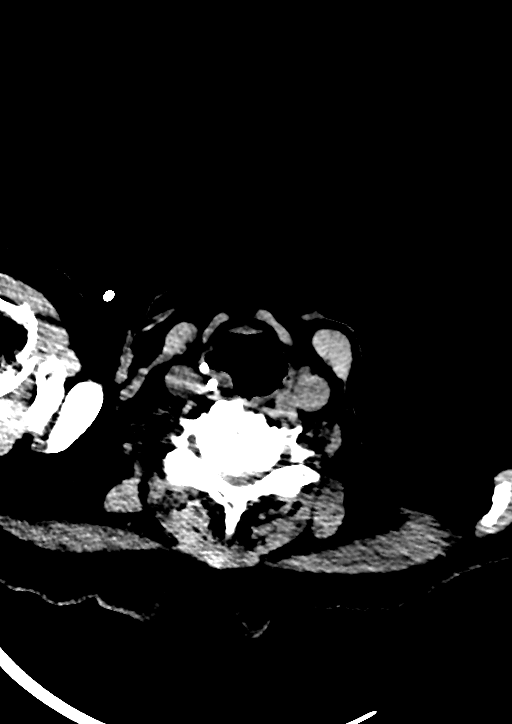
[im 62/106  brain]
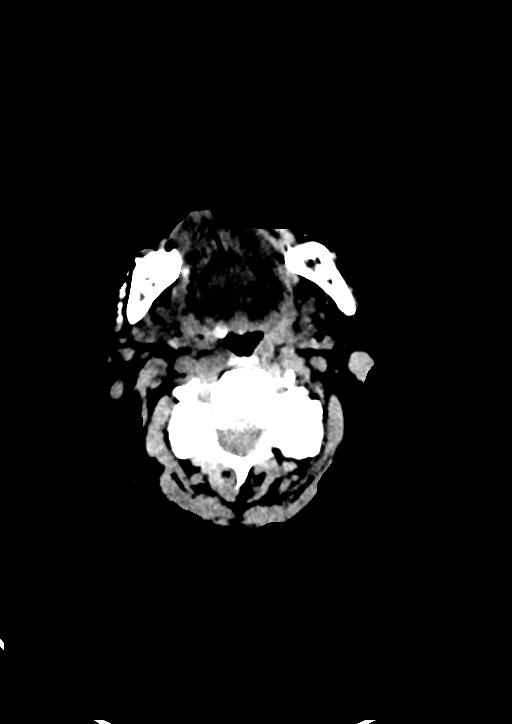
[im 62/106  bone]
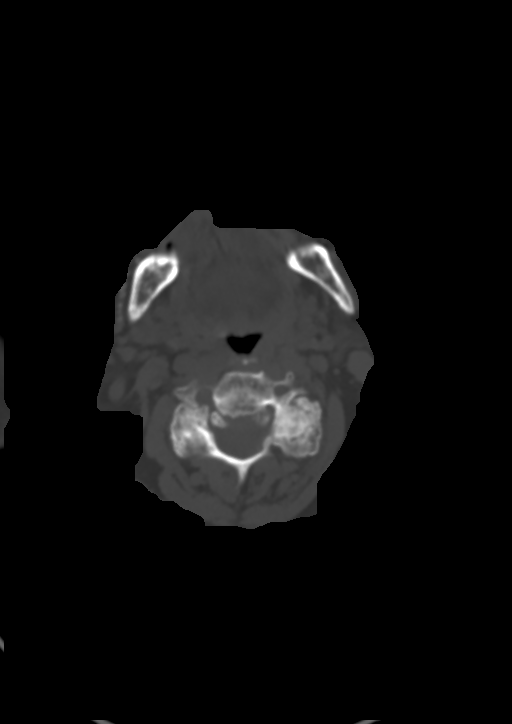
[im 71/106  brain]
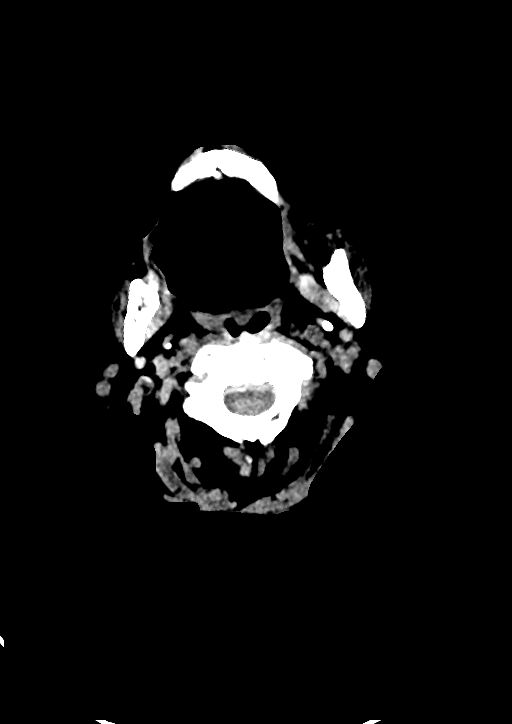
[im 79/106  brain]
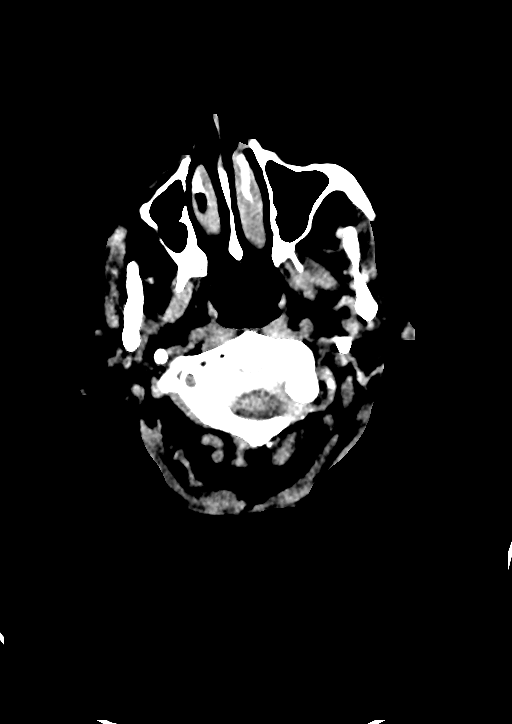
[im 97/106  brain]
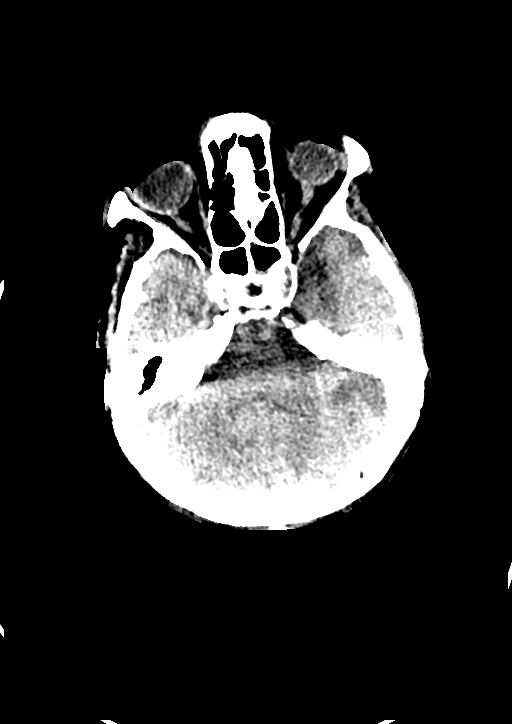

[13 of 47 positions shown; findings below may reference images not displayed]

FINDINGS: Brain: No evidence for acute infarction, hemorrhage, mass lesion, or
extra-axial fluid. Advanced atrophy. Extensive hypoattenuation of
white matter representing small vessel disease. Hydrocephalus ex
vacuo.

Vascular: Calcification of the cavernous internal carotid arteries
consistent with cerebrovascular atherosclerotic disease. No signs of
intracranial large vessel occlusion.

Skull: There is no skull fracture. There is a RIGHT frontal scalp
hematoma, which was present previously and is only minimally
smaller.

Sinuses/Orbits: No acute findings. Chronic sphenoid sinus disease.
BILATERAL cataract extraction.

Other: None.
IMPRESSION: 1. Atrophy and small vessel disease. No acute intracranial findings.
2. RIGHT frontal scalp hematoma without skull fracture or
intracranial hemorrhage.
